# Patient Record
Sex: Female | Born: 1965
Health system: Southern US, Community
[De-identification: ages and names within clinical notes are randomized; demographics above are authoritative.]

## PROBLEM LIST (undated history)

## (undated) DIAGNOSIS — M1990 Unspecified osteoarthritis, unspecified site: Secondary | ICD-10-CM

## (undated) DIAGNOSIS — R519 Headache, unspecified: Secondary | ICD-10-CM

## (undated) DIAGNOSIS — J449 Chronic obstructive pulmonary disease, unspecified: Secondary | ICD-10-CM

## (undated) DIAGNOSIS — J45909 Unspecified asthma, uncomplicated: Secondary | ICD-10-CM

## (undated) DIAGNOSIS — E669 Obesity, unspecified: Secondary | ICD-10-CM

## (undated) DIAGNOSIS — R0600 Dyspnea, unspecified: Secondary | ICD-10-CM

## (undated) DIAGNOSIS — K219 Gastro-esophageal reflux disease without esophagitis: Secondary | ICD-10-CM

## (undated) DIAGNOSIS — T7840XA Allergy, unspecified, initial encounter: Secondary | ICD-10-CM

## (undated) DIAGNOSIS — J40 Bronchitis, not specified as acute or chronic: Secondary | ICD-10-CM

## (undated) DIAGNOSIS — Z973 Presence of spectacles and contact lenses: Secondary | ICD-10-CM

## (undated) DIAGNOSIS — E039 Hypothyroidism, unspecified: Secondary | ICD-10-CM

## (undated) DIAGNOSIS — F329 Major depressive disorder, single episode, unspecified: Secondary | ICD-10-CM

## (undated) DIAGNOSIS — R51 Headache: Secondary | ICD-10-CM

## (undated) DIAGNOSIS — F32A Depression, unspecified: Secondary | ICD-10-CM

## (undated) DIAGNOSIS — Z9889 Other specified postprocedural states: Secondary | ICD-10-CM

## (undated) DIAGNOSIS — R112 Nausea with vomiting, unspecified: Secondary | ICD-10-CM

## (undated) DIAGNOSIS — F419 Anxiety disorder, unspecified: Secondary | ICD-10-CM

## (undated) DIAGNOSIS — M797 Fibromyalgia: Secondary | ICD-10-CM

## (undated) DIAGNOSIS — K5900 Constipation, unspecified: Secondary | ICD-10-CM

## (undated) DIAGNOSIS — G4730 Sleep apnea, unspecified: Secondary | ICD-10-CM

## (undated) HISTORY — PX: CYST EXCISION: SHX5701

## (undated) HISTORY — PX: FOOT SURGERY: SHX648

## (undated) HISTORY — PX: UPPER GASTROINTESTINAL ENDOSCOPY: SHX188

## (undated) HISTORY — PX: KNEE ARTHROSCOPY: SUR90

## (undated) HISTORY — PX: ADENOIDECTOMY: SUR15

## (undated) HISTORY — PX: BACK SURGERY: SHX140

## (undated) HISTORY — PX: COLONOSCOPY: SHX174

## (undated) HISTORY — PX: HAND SURGERY: SHX662

## (undated) HISTORY — DX: Hypothyroidism, unspecified: E03.9

## (undated) HISTORY — PX: ABDOMINAL HYSTERECTOMY: SHX81

## (undated) HISTORY — DX: Allergy, unspecified, initial encounter: T78.40XA

## (undated) HISTORY — PX: TONSILLECTOMY: SUR1361

---

## 2015-10-22 ENCOUNTER — Other Ambulatory Visit: Payer: Self-pay | Admitting: Otolaryngology

## 2015-10-22 DIAGNOSIS — Z8639 Personal history of other endocrine, nutritional and metabolic disease: Secondary | ICD-10-CM

## 2015-10-28 ENCOUNTER — Ambulatory Visit
Admission: RE | Admit: 2015-10-28 | Discharge: 2015-10-28 | Disposition: A | Discharge status: Home / Self Care | Payer: Self-pay | Source: Ambulatory Visit | Attending: Otolaryngology | Admitting: Otolaryngology

## 2015-10-28 DIAGNOSIS — Z8639 Personal history of other endocrine, nutritional and metabolic disease: Secondary | ICD-10-CM

## 2015-12-20 ENCOUNTER — Ambulatory Visit (INDEPENDENT_AMBULATORY_CARE_PROVIDER_SITE_OTHER): Payer: BLUE CROSS/BLUE SHIELD | Admitting: Endocrinology

## 2015-12-20 ENCOUNTER — Encounter: Payer: Self-pay | Admitting: Endocrinology

## 2015-12-20 VITALS — BP 106/70 | HR 74 | Temp 98.3°F | Ht 62.25 in | Wt 170.0 lb

## 2015-12-20 DIAGNOSIS — M542 Cervicalgia: Secondary | ICD-10-CM | POA: Diagnosis not present

## 2015-12-20 DIAGNOSIS — E039 Hypothyroidism, unspecified: Secondary | ICD-10-CM

## 2015-12-20 LAB — BASIC METABOLIC PANEL
BUN: 16 mg/dL (ref 6–23)
CHLORIDE: 104 meq/L (ref 96–112)
CO2: 29 mEq/L (ref 19–32)
Calcium: 9.5 mg/dL (ref 8.4–10.5)
Creatinine, Ser: 0.66 mg/dL (ref 0.40–1.20)
GFR: 99.9 mL/min (ref >60.00)
GLUCOSE: 85 mg/dL (ref 70–99)
POTASSIUM: 4.3 meq/L (ref 3.5–5.1)
SODIUM: 141 meq/L (ref 135–145)

## 2015-12-20 LAB — TSH: TSH: 1.5 u[IU]/mL (ref 0.35–4.50)

## 2015-12-20 NOTE — Patient Instructions (Signed)
blood tests are requested for you today.  We'll let you know about the results.  Let's check a CT scan. you will receive a phone call, about a day and time for an appointment.  Please return in 1 year, or sooner if necessary.

## 2015-12-20 NOTE — Progress Notes (Signed)
   Subjective:    Patient ID: Laura Burnett, female    DOB: 06/13/1966, 50 y.o.   MRN: [id number]  HPI Pt states 8 weeks of slight swelling of the neck, and slight intermittent pain.  she says she had a thyroid bx in approx 1992 (while living in [place]), and that was benign.  she was also dx'ed with hypothyroidism then and started on synthroid.  She has been on her current 75 mcg/d since 2016.  she has no h/o XRT or surgery to the neck.     Review of Systems Denies visual loss, chest pain, dysphagia, skin rash, easy bruising, headache, numbness, and rhinorrhea.  She has slight hoarseness, doe, dry cough, depression, cold intolerance, and fatigue.      Objective:   Physical Exam VS: see vs page GEN: no distress HEAD: head: no deformity eyes: no periorbital swelling, no proptosis external nose and ears are normal mouth: no lesion seen NECK: thyroid is slightly enlarged, with a firm and irreg surface.  CHEST WALL: no deformity LUNGS: clear to auscultation CV: reg rate and rhythm, no murmur ABD: abdomen is soft, nontender.  no hepatosplenomegaly.  not distended.  no hernia MUSCULOSKELETAL: muscle bulk and strength are grossly normal.  no obvious joint swelling.  gait is normal and steady EXTEMITIES: no deformity.  no edema. PULSES: no carotid bruit.  NEURO:  cn 2-12 grossly intact.   readily moves all 4's.  sensation is intact to touch on all 4's.  No tremor.  SKIN:  Normal texture and temperature.  No rash or suspicious lesion is visible.  Not diaphoretic NODES:  None palpable at the neck PSYCH: alert, well-oriented.  Does not appear anxious nor depressed.    [place]: small goiter without nodule Lab Results  Component Value Date   TSH 1.50 12/20/2015      Assessment & Plan:  Small goiter, prob due to chronic thyroiditis. Chronic hypothyroidism: well-replaced Neck pain, new, uncertain etiology.   Patient is advised the following: Patient Instructions  blood tests are requested for  you today.  We'll let you know about the results.  Let's check a CT scan. you will receive a phone call, about a day and time for an appointment.  Please return in 1 year, or sooner if necessary.    addendum: Please continue the same synthroid.

## 2015-12-24 ENCOUNTER — Telehealth: Payer: Self-pay | Admitting: Endocrinology

## 2015-12-24 NOTE — Telephone Encounter (Signed)
Rec'd from [facility] forward 4 pages to [place]

## 2015-12-25 ENCOUNTER — Ambulatory Visit (INDEPENDENT_AMBULATORY_CARE_PROVIDER_SITE_OTHER)
Admission: RE | Admit: 2015-12-25 | Discharge: 2015-12-25 | Disposition: A | Discharge status: Home / Self Care | Payer: BLUE CROSS/BLUE SHIELD | Source: Ambulatory Visit | Attending: Endocrinology | Admitting: Endocrinology

## 2015-12-25 DIAGNOSIS — M542 Cervicalgia: Secondary | ICD-10-CM

## 2015-12-25 MED ORDER — IOPAMIDOL (ISOVUE-300) INJECTION 61%
75.0000 mL | Freq: Once | INTRAVENOUS | Status: AC | PRN
  Administered 2015-12-25: 75 mL via INTRAVENOUS

## 2016-07-16 ENCOUNTER — Other Ambulatory Visit: Payer: Self-pay | Admitting: Nurse Practitioner

## 2016-07-16 DIAGNOSIS — M5106 Intervertebral disc disorders with myelopathy, lumbar region: Secondary | ICD-10-CM

## 2016-07-26 ENCOUNTER — Ambulatory Visit
Admission: RE | Admit: 2016-07-26 | Discharge: 2016-07-26 | Disposition: A | Discharge status: Home / Self Care | Payer: BLUE CROSS/BLUE SHIELD | Source: Ambulatory Visit | Attending: Nurse Practitioner | Admitting: Nurse Practitioner

## 2016-07-26 DIAGNOSIS — M5106 Intervertebral disc disorders with myelopathy, lumbar region: Secondary | ICD-10-CM

## 2016-08-10 ENCOUNTER — Other Ambulatory Visit: Payer: Self-pay | Admitting: Nurse Practitioner

## 2016-08-10 DIAGNOSIS — M5106 Intervertebral disc disorders with myelopathy, lumbar region: Secondary | ICD-10-CM

## 2016-08-18 ENCOUNTER — Ambulatory Visit
Admission: RE | Admit: 2016-08-18 | Discharge: 2016-08-18 | Disposition: A | Discharge status: Home / Self Care | Payer: BLUE CROSS/BLUE SHIELD | Source: Ambulatory Visit | Attending: Nurse Practitioner | Admitting: Nurse Practitioner

## 2016-08-18 ENCOUNTER — Institutional Professional Consult (permissible substitution): Payer: BLUE CROSS/BLUE SHIELD | Admitting: Internal Medicine

## 2016-08-18 DIAGNOSIS — M5106 Intervertebral disc disorders with myelopathy, lumbar region: Secondary | ICD-10-CM

## 2016-08-18 MED ORDER — IOPAMIDOL (ISOVUE-M 200) INJECTION 41%
1.0000 mL | Freq: Once | INTRAMUSCULAR | Status: AC
  Administered 2016-08-18: 1 mL via EPIDURAL

## 2016-08-18 MED ORDER — METHYLPREDNISOLONE ACETATE 40 MG/ML INJ SUSP (RADIOLOG
120.0000 mg | Freq: Once | INTRAMUSCULAR | Status: AC
  Administered 2016-08-18: 120 mg via EPIDURAL

## 2016-08-18 NOTE — Discharge Instructions (Signed)

## 2016-08-21 ENCOUNTER — Other Ambulatory Visit: Payer: BLUE CROSS/BLUE SHIELD

## 2016-10-22 ENCOUNTER — Other Ambulatory Visit: Payer: Self-pay | Admitting: Family

## 2016-10-22 DIAGNOSIS — Z1231 Encounter for screening mammogram for malignant neoplasm of breast: Secondary | ICD-10-CM

## 2016-10-26 ENCOUNTER — Ambulatory Visit
Admission: RE | Admit: 2016-10-26 | Discharge: 2016-10-26 | Disposition: A | Discharge status: Home / Self Care | Payer: BLUE CROSS/BLUE SHIELD | Source: Ambulatory Visit | Attending: Family | Admitting: Family

## 2016-10-26 DIAGNOSIS — Z1231 Encounter for screening mammogram for malignant neoplasm of breast: Secondary | ICD-10-CM

## 2016-11-13 ENCOUNTER — Other Ambulatory Visit: Payer: Self-pay | Admitting: Neurological Surgery

## 2016-11-26 ENCOUNTER — Other Ambulatory Visit (HOSPITAL_COMMUNITY): Payer: Self-pay | Admitting: Neurological Surgery

## 2016-11-26 DIAGNOSIS — M5416 Radiculopathy, lumbar region: Secondary | ICD-10-CM

## 2016-12-04 ENCOUNTER — Ambulatory Visit (HOSPITAL_COMMUNITY)
Admission: RE | Admit: 2016-12-04 | Discharge: 2016-12-04 | Disposition: A | Discharge status: Home / Self Care | Payer: BLUE CROSS/BLUE SHIELD | Source: Ambulatory Visit | Attending: Neurological Surgery | Admitting: Neurological Surgery

## 2016-12-04 DIAGNOSIS — I700 Atherosclerosis of aorta: Secondary | ICD-10-CM | POA: Insufficient documentation

## 2016-12-04 DIAGNOSIS — M5416 Radiculopathy, lumbar region: Secondary | ICD-10-CM | POA: Diagnosis present

## 2016-12-04 DIAGNOSIS — M4807 Spinal stenosis, lumbosacral region: Secondary | ICD-10-CM | POA: Insufficient documentation

## 2016-12-14 ENCOUNTER — Encounter (HOSPITAL_COMMUNITY): Payer: Self-pay

## 2016-12-14 ENCOUNTER — Encounter (HOSPITAL_COMMUNITY)
Admission: RE | Admit: 2016-12-14 | Discharge: 2016-12-14 | Disposition: A | Discharge status: Home / Self Care | Payer: BLUE CROSS/BLUE SHIELD | Source: Ambulatory Visit | Attending: Neurological Surgery | Admitting: Neurological Surgery

## 2016-12-14 DIAGNOSIS — Z01812 Encounter for preprocedural laboratory examination: Secondary | ICD-10-CM | POA: Insufficient documentation

## 2016-12-14 HISTORY — DX: Unspecified osteoarthritis, unspecified site: M19.90

## 2016-12-14 HISTORY — DX: Gastro-esophageal reflux disease without esophagitis: K21.9

## 2016-12-14 HISTORY — DX: Depression, unspecified: F32.A

## 2016-12-14 HISTORY — DX: Other specified postprocedural states: Z98.890

## 2016-12-14 HISTORY — DX: Nausea with vomiting, unspecified: R11.2

## 2016-12-14 HISTORY — DX: Fibromyalgia: M79.7

## 2016-12-14 HISTORY — DX: Major depressive disorder, single episode, unspecified: F32.9

## 2016-12-14 LAB — BASIC METABOLIC PANEL
ANION GAP: 8 (ref 5–15)
BUN: 10 mg/dL (ref 6–20)
CHLORIDE: 107 mmol/L (ref 101–111)
CO2: 26 mmol/L (ref 22–32)
Calcium: 9.1 mg/dL (ref 8.9–10.3)
Creatinine, Ser: 0.75 mg/dL (ref 0.44–1.00)
GFR calc Af Amer: >60 mL/min (ref >60)
GLUCOSE: 83 mg/dL (ref 65–99)
POTASSIUM: 4 mmol/L (ref 3.5–5.1)
Sodium: 141 mmol/L (ref 135–145)

## 2016-12-14 LAB — SURGICAL PCR SCREEN
MRSA, PCR: NEGATIVE
Staphylococcus aureus: NEGATIVE

## 2016-12-14 LAB — TYPE AND SCREEN
ABO/RH(D): O NEG
Antibody Screen: NEGATIVE

## 2016-12-14 LAB — CBC
HCT: 41.6 % (ref 36.0–46.0)
HEMOGLOBIN: 13.6 g/dL (ref 12.0–15.0)
MCH: 29.6 pg (ref 26.0–34.0)
MCHC: 32.7 g/dL (ref 30.0–36.0)
MCV: 90.6 fL (ref 78.0–100.0)
PLATELETS: 329 10*3/uL (ref 150–400)
RBC: 4.59 MIL/uL (ref 3.87–5.11)
RDW: 13.6 % (ref 11.5–15.5)
WBC: 5.8 10*3/uL (ref 4.0–10.5)

## 2016-12-14 LAB — ABO/RH: ABO/RH(D): O NEG

## 2016-12-14 LAB — APTT: APTT: 33 s (ref 24–36)

## 2016-12-14 LAB — PROTIME-INR
INR: 1.05
Prothrombin Time: 13.7 seconds (ref 11.4–15.2)

## 2016-12-14 MED ORDER — CHLORHEXIDINE GLUCONATE CLOTH 2 % EX PADS: 6.0000 | MEDICATED_PAD | Freq: Once | CUTANEOUS | Status: DC

## 2016-12-14 NOTE — Pre-Procedure Instructions (Signed)
    ILLENE SWEETING  12/14/2016      [facility] [address], [place] [address] Phone: [phone] Fax: [phone]    Your procedure is scheduled on 12/21/16.  Report to [facility] Admitting at 530 A.M.  Call this number if you have problems the morning of surgery:  [phone]   Remember:  Do not eat food or drink liquids after midnight.  Take these medicines the morning of surgery with A SIP OF WATER ---cymbalta,nexium,neurontin,synthroid,oxycodone   Do not wear jewelry, make-up or nail polish.  Do not wear lotions, powders, or perfumes, or deoderant.  Do not shave 48 hours prior to surgery.  Men may shave face and neck.  Do not bring valuables to the hospital.  [facility] is not responsible for any belongings or valuables.  Contacts, dentures or bridgework may not be worn into surgery.  Leave your suitcase in the car.  After surgery it may be brought to your room.  For patients admitted to the hospital, discharge time will be determined by your treatment team.  Patients discharged the day of surgery will not be allowed to drive home.   Name and phone number of your driver:    Special instructions:  Do not take any aspirin,anti-inflammatories,vitamins,or herbal supplements 5-7 days prior to surgery.  Please read over the following fact sheets that you were given. MRSA Information

## 2016-12-20 NOTE — Anesthesia Preprocedure Evaluation (Signed)
Anesthesia Evaluation  Patient identified by MRN, date of birth, ID band Patient awake    Reviewed: Allergy & Precautions, H&P , Patient's Chart, lab work & pertinent test results, reviewed documented beta blocker date and time   Airway Mallampati: II  TM Distance: >3 FB Neck ROM: full    Dental no notable dental hx.    Pulmonary former smoker,    Pulmonary exam normal breath sounds clear to auscultation       Cardiovascular  Rhythm:regular Rate:Normal     Neuro/Psych    GI/Hepatic   Endo/Other    Renal/GU      Musculoskeletal   Abdominal   Peds  Hematology   Anesthesia Other Findings   Reproductive/Obstetrics                             Anesthesia Physical Anesthesia Plan  ASA: II  Anesthesia Plan: General   Post-op Pain Management:    Induction: Intravenous  Airway Management Planned: Oral ETT  Additional Equipment:   Intra-op Plan:   Post-operative Plan: Extubation in OR  Informed Consent: I have reviewed the patients History and Physical, chart, labs and discussed the procedure including the risks, benefits and alternatives for the proposed anesthesia with the patient or authorized representative who has indicated his/her understanding and acceptance.   Dental Advisory Given  Plan Discussed with: CRNA and Surgeon  Anesthesia Plan Comments: (  )        Anesthesia Quick Evaluation

## 2016-12-21 ENCOUNTER — Ambulatory Visit: Payer: BLUE CROSS/BLUE SHIELD | Admitting: Endocrinology

## 2016-12-21 ENCOUNTER — Encounter (HOSPITAL_COMMUNITY)
Admission: RE | Disposition: A | Discharge status: Home / Self Care | Payer: Self-pay | Source: Ambulatory Visit | Attending: Neurological Surgery

## 2016-12-21 ENCOUNTER — Inpatient Hospital Stay (HOSPITAL_COMMUNITY): Payer: BLUE CROSS/BLUE SHIELD | Admitting: Anesthesiology

## 2016-12-21 ENCOUNTER — Encounter (HOSPITAL_COMMUNITY): Payer: Self-pay | Admitting: Certified Registered"

## 2016-12-21 ENCOUNTER — Inpatient Hospital Stay (HOSPITAL_COMMUNITY)
Admission: RE | Admit: 2016-12-21 | Discharge: 2016-12-22 | DRG: 460 | Disposition: A | Discharge status: Home / Self Care | Payer: BLUE CROSS/BLUE SHIELD | Source: Ambulatory Visit | Attending: Neurological Surgery | Admitting: Neurological Surgery

## 2016-12-21 ENCOUNTER — Inpatient Hospital Stay (HOSPITAL_COMMUNITY): Payer: BLUE CROSS/BLUE SHIELD

## 2016-12-21 DIAGNOSIS — Z87891 Personal history of nicotine dependence: Secondary | ICD-10-CM

## 2016-12-21 DIAGNOSIS — Z79899 Other long term (current) drug therapy: Secondary | ICD-10-CM | POA: Diagnosis not present

## 2016-12-21 DIAGNOSIS — M5416 Radiculopathy, lumbar region: Secondary | ICD-10-CM | POA: Diagnosis present

## 2016-12-21 DIAGNOSIS — M797 Fibromyalgia: Secondary | ICD-10-CM | POA: Diagnosis present

## 2016-12-21 DIAGNOSIS — E039 Hypothyroidism, unspecified: Secondary | ICD-10-CM | POA: Diagnosis present

## 2016-12-21 DIAGNOSIS — M4807 Spinal stenosis, lumbosacral region: Secondary | ICD-10-CM | POA: Diagnosis present

## 2016-12-21 DIAGNOSIS — K219 Gastro-esophageal reflux disease without esophagitis: Secondary | ICD-10-CM | POA: Diagnosis present

## 2016-12-21 DIAGNOSIS — M4727 Other spondylosis with radiculopathy, lumbosacral region: Secondary | ICD-10-CM | POA: Diagnosis present

## 2016-12-21 DIAGNOSIS — F329 Major depressive disorder, single episode, unspecified: Secondary | ICD-10-CM | POA: Diagnosis present

## 2016-12-21 DIAGNOSIS — Z419 Encounter for procedure for purposes other than remedying health state, unspecified: Secondary | ICD-10-CM

## 2016-12-21 DIAGNOSIS — M545 Low back pain: Secondary | ICD-10-CM | POA: Diagnosis present

## 2016-12-21 HISTORY — PX: APPLICATION OF ROBOTIC ASSISTANCE FOR SPINAL PROCEDURE: SHX6753

## 2016-12-21 SURGERY — POSTERIOR LUMBAR FUSION 1 LEVEL
Anesthesia: General | Site: Back

## 2016-12-21 MED ORDER — THROMBIN 20000 UNITS EX SOLR
CUTANEOUS | Status: DC | PRN
  Administered 2016-12-21: 20 mL via TOPICAL

## 2016-12-21 MED ORDER — PROPOFOL 10 MG/ML IV BOLUS
INTRAVENOUS | Status: AC
  Filled 2016-12-21: qty 20

## 2016-12-21 MED ORDER — DOCUSATE SODIUM 100 MG PO CAPS
100.0000 mg | ORAL_CAPSULE | Freq: Two times a day (BID) | ORAL | Status: DC
  Administered 2016-12-21 – 2016-12-22 (×3): 100 mg via ORAL
  Filled 2016-12-21 (×3): qty 1

## 2016-12-21 MED ORDER — MORPHINE SULFATE (PF) 4 MG/ML IV SOLN: 1.0000 mg | INTRAVENOUS | Status: DC | PRN

## 2016-12-21 MED ORDER — CEFAZOLIN SODIUM-DEXTROSE 2-4 GM/100ML-% IV SOLN
2.0000 g | Freq: Three times a day (TID) | INTRAVENOUS | Status: AC
  Administered 2016-12-21 (×2): 2 g via INTRAVENOUS
  Filled 2016-12-21 (×2): qty 100

## 2016-12-21 MED ORDER — LIDOCAINE-EPINEPHRINE 2 %-1:100000 IJ SOLN
INTRAMUSCULAR | Status: DC | PRN
  Administered 2016-12-21: 30 mL

## 2016-12-21 MED ORDER — SODIUM CHLORIDE 0.9% FLUSH
3.0000 mL | Freq: Two times a day (BID) | INTRAVENOUS | Status: DC
  Administered 2016-12-21: 3 mL via INTRAVENOUS

## 2016-12-21 MED ORDER — ZOLPIDEM TARTRATE 5 MG PO TABS: 5.0000 mg | ORAL_TABLET | Freq: Every evening | ORAL | Status: DC | PRN

## 2016-12-21 MED ORDER — BISACODYL 10 MG RE SUPP: 10.0000 mg | Freq: Every day | RECTAL | Status: DC | PRN

## 2016-12-21 MED ORDER — SODIUM CHLORIDE 0.9 % IV SOLN: 250.0000 mL | INTRAVENOUS | Status: DC

## 2016-12-21 MED ORDER — ONDANSETRON HCL 4 MG/2ML IJ SOLN: 4.0000 mg | Freq: Four times a day (QID) | INTRAMUSCULAR | Status: DC | PRN

## 2016-12-21 MED ORDER — MIDAZOLAM HCL 2 MG/2ML IJ SOLN
INTRAMUSCULAR | Status: AC
  Filled 2016-12-21: qty 2

## 2016-12-21 MED ORDER — THROMBIN 5000 UNITS EX SOLR
OROMUCOSAL | Status: DC | PRN
  Administered 2016-12-21 (×2): 5 mL via TOPICAL

## 2016-12-21 MED ORDER — FENTANYL CITRATE (PF) 100 MCG/2ML IJ SOLN
INTRAMUSCULAR | Status: DC | PRN
  Administered 2016-12-21 (×3): 50 ug via INTRAVENOUS

## 2016-12-21 MED ORDER — METHYLPREDNISOLONE ACETATE 80 MG/ML IJ SUSP
INTRAMUSCULAR | Status: DC | PRN
  Administered 2016-12-21: 80 mg

## 2016-12-21 MED ORDER — MIDAZOLAM HCL 5 MG/5ML IJ SOLN
INTRAMUSCULAR | Status: DC | PRN
Start: 2016-12-21 — End: 2016-12-21
  Administered 2016-12-21: 2 mg via INTRAVENOUS

## 2016-12-21 MED ORDER — VANCOMYCIN HCL 1000 MG IV SOLR
INTRAVENOUS | Status: DC | PRN
  Administered 2016-12-21: 1000 mg via TOPICAL

## 2016-12-21 MED ORDER — HYDROCODONE-ACETAMINOPHEN 7.5-325 MG PO TABS
1.0000 | ORAL_TABLET | Freq: Four times a day (QID) | ORAL | Status: DC
  Administered 2016-12-21 – 2016-12-22 (×4): 1 via ORAL
  Filled 2016-12-21 (×4): qty 1

## 2016-12-21 MED ORDER — SENNOSIDES-DOCUSATE SODIUM 8.6-50 MG PO TABS: 1.0000 | ORAL_TABLET | Freq: Every evening | ORAL | Status: DC | PRN

## 2016-12-21 MED ORDER — SCOPOLAMINE 1 MG/3DAYS TD PT72
MEDICATED_PATCH | TRANSDERMAL | Status: DC | PRN
  Administered 2016-12-21: 1 via TRANSDERMAL

## 2016-12-21 MED ORDER — BUPIVACAINE LIPOSOME 1.3 % IJ SUSP
INTRAMUSCULAR | Status: DC | PRN
  Administered 2016-12-21: 20 mL

## 2016-12-21 MED ORDER — SENNA 8.6 MG PO TABS
1.0000 | ORAL_TABLET | Freq: Two times a day (BID) | ORAL | Status: DC
  Administered 2016-12-21 – 2016-12-22 (×3): 8.6 mg via ORAL
  Filled 2016-12-21 (×3): qty 1

## 2016-12-21 MED ORDER — SODIUM CHLORIDE 0.9 % IR SOLN
Status: DC | PRN
  Administered 2016-12-21: 500 mL

## 2016-12-21 MED ORDER — HYDROMORPHONE HCL 1 MG/ML IJ SOLN
0.2500 mg | INTRAMUSCULAR | Status: DC | PRN
  Administered 2016-12-21 (×2): 0.25 mg via INTRAVENOUS

## 2016-12-21 MED ORDER — ACETAMINOPHEN 10 MG/ML IV SOLN
1000.0000 mg | Freq: Once | INTRAVENOUS | Status: AC
  Administered 2016-12-21: 1000 mg via INTRAVENOUS
  Filled 2016-12-21: qty 100

## 2016-12-21 MED ORDER — ACETAMINOPHEN 325 MG PO TABS
650.0000 mg | ORAL_TABLET | ORAL | Status: DC | PRN
  Administered 2016-12-22: 650 mg via ORAL
  Filled 2016-12-21: qty 2

## 2016-12-21 MED ORDER — SODIUM CHLORIDE 0.9 % IV SOLN: INTRAVENOUS | Status: DC

## 2016-12-21 MED ORDER — THROMBIN 20000 UNITS EX SOLR
CUTANEOUS | Status: AC
  Filled 2016-12-21: qty 20000

## 2016-12-21 MED ORDER — METHOCARBAMOL 1000 MG/10ML IJ SOLN
750.0000 mg | Freq: Once | INTRAMUSCULAR | Status: AC
  Administered 2016-12-21: .75 g via INTRAVENOUS
  Filled 2016-12-21: qty 7.5

## 2016-12-21 MED ORDER — SODIUM CHLORIDE 0.9% FLUSH: 3.0000 mL | INTRAVENOUS | Status: DC | PRN

## 2016-12-21 MED ORDER — LIDOCAINE HCL (CARDIAC) 20 MG/ML IV SOLN
INTRAVENOUS | Status: DC | PRN
  Administered 2016-12-21: 60 mg via INTRAVENOUS

## 2016-12-21 MED ORDER — DEXTROSE 5 % IV SOLN
INTRAVENOUS | Status: DC | PRN
  Administered 2016-12-21: 15 ug/min via INTRAVENOUS

## 2016-12-21 MED ORDER — MENTHOL 3 MG MT LOZG
1.0000 | LOZENGE | OROMUCOSAL | Status: DC | PRN
  Filled 2016-12-21: qty 9

## 2016-12-21 MED ORDER — THROMBIN 5000 UNITS EX SOLR
CUTANEOUS | Status: AC
  Filled 2016-12-21: qty 5000

## 2016-12-21 MED ORDER — KETOROLAC TROMETHAMINE 30 MG/ML IJ SOLN
INTRAMUSCULAR | Status: DC | PRN
  Administered 2016-12-21: 30 mg via INTRAVENOUS

## 2016-12-21 MED ORDER — ONDANSETRON HCL 4 MG/2ML IJ SOLN
INTRAMUSCULAR | Status: AC
  Filled 2016-12-21: qty 2

## 2016-12-21 MED ORDER — LIDOCAINE-EPINEPHRINE 2 %-1:100000 IJ SOLN
INTRAMUSCULAR | Status: AC
  Filled 2016-12-21: qty 1

## 2016-12-21 MED ORDER — CEFAZOLIN SODIUM-DEXTROSE 2-4 GM/100ML-% IV SOLN
2.0000 g | INTRAVENOUS | Status: AC
  Administered 2016-12-21: 2 g via INTRAVENOUS
  Filled 2016-12-21: qty 100

## 2016-12-21 MED ORDER — DEXAMETHASONE SODIUM PHOSPHATE 10 MG/ML IJ SOLN
INTRAMUSCULAR | Status: DC | PRN
  Administered 2016-12-21: 10 mg via INTRAVENOUS

## 2016-12-21 MED ORDER — OXYCODONE HCL 5 MG PO TABS
5.0000 mg | ORAL_TABLET | ORAL | Status: DC | PRN
  Administered 2016-12-21 – 2016-12-22 (×5): 10 mg via ORAL
  Administered 2016-12-22: 5 mg via ORAL
  Filled 2016-12-21 (×2): qty 2
  Filled 2016-12-21: qty 1
  Filled 2016-12-21 (×3): qty 2

## 2016-12-21 MED ORDER — BUPIVACAINE HCL (PF) 0.25 % IJ SOLN
INTRAMUSCULAR | Status: DC | PRN
  Administered 2016-12-21: 30 mL

## 2016-12-21 MED ORDER — METHYLPREDNISOLONE ACETATE 80 MG/ML IJ SUSP
INTRAMUSCULAR | Status: AC
  Filled 2016-12-21: qty 1

## 2016-12-21 MED ORDER — BUPIVACAINE HCL (PF) 0.25 % IJ SOLN
INTRAMUSCULAR | Status: AC
  Filled 2016-12-21: qty 30

## 2016-12-21 MED ORDER — GABAPENTIN 300 MG PO CAPS
300.0000 mg | ORAL_CAPSULE | ORAL | Status: DC
  Filled 2016-12-21: qty 1

## 2016-12-21 MED ORDER — GABAPENTIN 300 MG PO CAPS
300.0000 mg | ORAL_CAPSULE | Freq: Three times a day (TID) | ORAL | Status: DC
  Administered 2016-12-21 – 2016-12-22 (×3): 300 mg via ORAL
  Filled 2016-12-21 (×3): qty 1

## 2016-12-21 MED ORDER — VANCOMYCIN HCL 1000 MG IV SOLR
INTRAVENOUS | Status: AC
  Filled 2016-12-21: qty 1000

## 2016-12-21 MED ORDER — LACTATED RINGERS IV SOLN
INTRAVENOUS | Status: DC | PRN
  Administered 2016-12-21 (×3): via INTRAVENOUS

## 2016-12-21 MED ORDER — BUPIVACAINE LIPOSOME 1.3 % IJ SUSP
20.0000 mL | Freq: Once | INTRAMUSCULAR | Status: DC
  Filled 2016-12-21: qty 20

## 2016-12-21 MED ORDER — SUGAMMADEX SODIUM 200 MG/2ML IV SOLN
INTRAVENOUS | Status: AC
  Filled 2016-12-21: qty 2

## 2016-12-21 MED ORDER — SUGAMMADEX SODIUM 200 MG/2ML IV SOLN
INTRAVENOUS | Status: DC | PRN
  Administered 2016-12-21: 200 mg via INTRAVENOUS

## 2016-12-21 MED ORDER — SCOPOLAMINE 1 MG/3DAYS TD PT72
MEDICATED_PATCH | TRANSDERMAL | Status: AC
  Filled 2016-12-21: qty 1

## 2016-12-21 MED ORDER — FLEET ENEMA 7-19 GM/118ML RE ENEM: 1.0000 | ENEMA | Freq: Once | RECTAL | Status: DC | PRN

## 2016-12-21 MED ORDER — ROCURONIUM BROMIDE 100 MG/10ML IV SOLN
INTRAVENOUS | Status: DC | PRN
  Administered 2016-12-21 (×2): 10 mg via INTRAVENOUS
  Administered 2016-12-21: 40 mg via INTRAVENOUS
  Administered 2016-12-21: 10 mg via INTRAVENOUS

## 2016-12-21 MED ORDER — ONDANSETRON HCL 4 MG/2ML IJ SOLN
INTRAMUSCULAR | Status: DC | PRN
  Administered 2016-12-21: 4 mg via INTRAVENOUS

## 2016-12-21 MED ORDER — CYCLOBENZAPRINE HCL 10 MG PO TABS
10.0000 mg | ORAL_TABLET | Freq: Three times a day (TID) | ORAL | Status: DC | PRN
  Administered 2016-12-21 – 2016-12-22 (×2): 10 mg via ORAL
  Filled 2016-12-21 (×2): qty 1

## 2016-12-21 MED ORDER — PHENYLEPHRINE HCL 10 MG/ML IJ SOLN
INTRAMUSCULAR | Status: DC | PRN
  Administered 2016-12-21 (×3): 80 ug via INTRAVENOUS

## 2016-12-21 MED ORDER — HYDROMORPHONE HCL 1 MG/ML IJ SOLN
INTRAMUSCULAR | Status: AC
  Filled 2016-12-21: qty 0.5

## 2016-12-21 MED ORDER — ACETAMINOPHEN 650 MG RE SUPP: 650.0000 mg | RECTAL | Status: DC | PRN

## 2016-12-21 MED ORDER — FENTANYL CITRATE (PF) 250 MCG/5ML IJ SOLN
INTRAMUSCULAR | Status: AC
  Filled 2016-12-21: qty 5

## 2016-12-21 MED ORDER — ONDANSETRON HCL 4 MG PO TABS: 4.0000 mg | ORAL_TABLET | Freq: Four times a day (QID) | ORAL | Status: DC | PRN

## 2016-12-21 MED ORDER — 0.9 % SODIUM CHLORIDE (POUR BTL) OPTIME
TOPICAL | Status: DC | PRN
  Administered 2016-12-21: 1000 mL

## 2016-12-21 MED ORDER — PHENOL 1.4 % MT LIQD: 1.0000 | OROMUCOSAL | Status: DC | PRN

## 2016-12-21 MED ORDER — PROPOFOL 10 MG/ML IV BOLUS
INTRAVENOUS | Status: DC | PRN
  Administered 2016-12-21: 130 mg via INTRAVENOUS

## 2016-12-21 SURGICAL SUPPLY — 91 items
BAG DECANTER FOR FLEXI CONT (MISCELLANEOUS) ×4 IMPLANT
BENZOIN TINCTURE PRP APPL 2/3 (GAUZE/BANDAGES/DRESSINGS) IMPLANT
BIT DRILL LONG 3.0X30 (BIT) IMPLANT
BIT DRILL LONG 3X80 (BIT) IMPLANT
BIT DRILL LONG 4X80 (BIT) IMPLANT
BIT DRILL SHORT 3.0X30 (BIT) IMPLANT
BIT DRILL SHORT 3X80 (BIT) IMPLANT
BLADE CLIPPER SURG (BLADE) IMPLANT
BLADE SURG 11 STRL SS (BLADE) ×4 IMPLANT
BUR MATCHSTICK NEURO 3.0 LAGG (BURR) ×4 IMPLANT
BUR ROUND FLUTED 5 RND (BURR) ×3 IMPLANT
BUR ROUND FLUTED 5MM RND (BURR) ×1
CANISTER SUCT 3000ML PPV (MISCELLANEOUS) ×4 IMPLANT
CARTRIDGE OIL MAESTRO DRILL (MISCELLANEOUS) ×2 IMPLANT
CHLORAPREP W/TINT 26ML (MISCELLANEOUS) ×4 IMPLANT
DECANTER SPIKE VIAL GLASS SM (MISCELLANEOUS) IMPLANT
DERMABOND ADVANCED (GAUZE/BANDAGES/DRESSINGS) ×2
DERMABOND ADVANCED .7 DNX12 (GAUZE/BANDAGES/DRESSINGS) ×2 IMPLANT
DIFFUSER DRILL AIR PNEUMATIC (MISCELLANEOUS) ×4 IMPLANT
DRAPE C-ARM 42X72 X-RAY (DRAPES) ×8 IMPLANT
DRAPE MICROSCOPE LEICA (MISCELLANEOUS) IMPLANT
DRAPE POUCH INSTRU U-SHP 10X18 (DRAPES) ×4 IMPLANT
DRAPE SHEET LG 3/4 BI-LAMINATE (DRAPES) IMPLANT
DRAPE SURG 17X23 STRL (DRAPES) ×4 IMPLANT
DRSG OPSITE POSTOP 4X6 (GAUZE/BANDAGES/DRESSINGS) ×4 IMPLANT
ELECT BLADE 4.0 EZ CLEAN MEGAD (MISCELLANEOUS)
ELECT REM PT RETURN 9FT ADLT (ELECTROSURGICAL) ×4
ELECTRODE BLDE 4.0 EZ CLN MEGD (MISCELLANEOUS) IMPLANT
ELECTRODE REM PT RTRN 9FT ADLT (ELECTROSURGICAL) ×2 IMPLANT
GAUZE SPONGE 4X4 12PLY STRL (GAUZE/BANDAGES/DRESSINGS) IMPLANT
GAUZE SPONGE 4X4 16PLY XRAY LF (GAUZE/BANDAGES/DRESSINGS) IMPLANT
GLOVE BIO SURGEON STRL SZ7 (GLOVE) IMPLANT
GLOVE BIO SURGEON STRL SZ7.5 (GLOVE) ×4 IMPLANT
GLOVE BIO SURGEON STRL SZ8 (GLOVE) ×4 IMPLANT
GLOVE BIO SURGEON STRL SZ8.5 (GLOVE) ×4 IMPLANT
GLOVE BIOGEL PI IND STRL 6.5 (GLOVE) ×2 IMPLANT
GLOVE BIOGEL PI IND STRL 7.0 (GLOVE) ×4 IMPLANT
GLOVE BIOGEL PI IND STRL 7.5 (GLOVE) ×4 IMPLANT
GLOVE BIOGEL PI INDICATOR 6.5 (GLOVE) ×2
GLOVE BIOGEL PI INDICATOR 7.0 (GLOVE) ×4
GLOVE BIOGEL PI INDICATOR 7.5 (GLOVE) ×4
GLOVE SS BIOGEL STRL SZ 7.5 (GLOVE) ×6 IMPLANT
GLOVE SUPERSENSE BIOGEL SZ 7.5 (GLOVE) ×6
GLOVE SURG SS PI 6.5 STRL IVOR (GLOVE) ×12 IMPLANT
GOWN STRL REUS W/ TWL LRG LVL3 (GOWN DISPOSABLE) ×6 IMPLANT
GOWN STRL REUS W/ TWL XL LVL3 (GOWN DISPOSABLE) ×2 IMPLANT
GOWN STRL REUS W/TWL LRG LVL3 (GOWN DISPOSABLE) ×6
GOWN STRL REUS W/TWL XL LVL3 (GOWN DISPOSABLE) ×2
HEMOSTAT POWDER KIT SURGIFOAM (HEMOSTASIS) ×4 IMPLANT
KIT BASIN OR (CUSTOM PROCEDURE TRAY) ×4 IMPLANT
KIT INFUSE XX SMALL 0.7CC (Orthopedic Implant) ×4 IMPLANT
KIT ROOM TURNOVER OR (KITS) ×4 IMPLANT
KIT SPINE MAZOR X ROBO DISP (MISCELLANEOUS) ×3 IMPLANT
NEEDLE HYPO 18GX1.5 BLUNT FILL (NEEDLE) IMPLANT
NEEDLE HYPO 21X1.5 SAFETY (NEEDLE) ×8 IMPLANT
NEEDLE HYPO 25X1 1.5 SAFETY (NEEDLE) ×4 IMPLANT
NS IRRIG 1000ML POUR BTL (IV SOLUTION) ×4 IMPLANT
OIL CARTRIDGE MAESTRO DRILL (MISCELLANEOUS) ×4
PACK LAMINECTOMY NEURO (CUSTOM PROCEDURE TRAY) ×4 IMPLANT
PACK UNIVERSAL I (CUSTOM PROCEDURE TRAY) ×4 IMPLANT
PAD ARMBOARD 7.5X6 YLW CONV (MISCELLANEOUS) ×4 IMPLANT
PATTIES SURGICAL .5X1.5 (GAUZE/BANDAGES/DRESSINGS) IMPLANT
PIN HEAD 2.5X60MM (PIN) IMPLANT
PUTTY DBM GRAFTON 5CC (Putty) ×3 IMPLANT
ROD PC 5.5X35 TI ARSENAL (Rod) ×6 IMPLANT
RUBBERBAND STERILE (MISCELLANEOUS) IMPLANT
SCREW CBX 5.5X35MM ARSENAL (Screw) ×8 IMPLANT
SCREW CBX ARSENAL 6.5X35 (Screw) ×6 IMPLANT
SCREW SCHANZ SA 4.0MM (MISCELLANEOUS) IMPLANT
SCREW SET SPINAL ARSENAL 47127 (Screw) ×16 IMPLANT
SPACER PEEK PS 25X8MM 9MM 5DEG (Spacer) ×6 IMPLANT
SPONGE NEURO XRAY DETECT 1X3 (DISPOSABLE) ×4 IMPLANT
SPONGE SURGIFOAM ABS GEL 100 (HEMOSTASIS) ×4 IMPLANT
STRIP SURGICAL 1 X 6 IN (GAUZE/BANDAGES/DRESSINGS) IMPLANT
STRIP SURGICAL 1/2 X 6 IN (GAUZE/BANDAGES/DRESSINGS) IMPLANT
STRIP SURGICAL 1/4 X 6 IN (GAUZE/BANDAGES/DRESSINGS) IMPLANT
STRIP SURGICAL 3/4 X 6 IN (GAUZE/BANDAGES/DRESSINGS) IMPLANT
SUT STRATAFIX MNCRL+ 3-0 PS-2 (SUTURE) ×2
SUT STRATAFIX MONOCRYL 3-0 (SUTURE) ×2
SUT VIC AB 0 CT1 18XCR BRD8 (SUTURE) ×4 IMPLANT
SUT VIC AB 0 CT1 8-18 (SUTURE) ×4
SUT VIC AB 2-0 CT1 18 (SUTURE) ×8 IMPLANT
SUT VIC AB 3-0 SH 8-18 (SUTURE) ×8 IMPLANT
SUT VIC AB 4-0 PS2 27 (SUTURE) ×4 IMPLANT
SUTURE STRATFX MNCRL+ 3-0 PS-2 (SUTURE) ×2 IMPLANT
SYR 30ML LL (SYRINGE) ×8 IMPLANT
SYR 5ML LL (SYRINGE) IMPLANT
TIP BLUNT NITINOL ILLICO 18 (INSTRUMENTS) ×4 IMPLANT
TOWEL GREEN STERILE (TOWEL DISPOSABLE) ×4 IMPLANT
TOWEL GREEN STERILE FF (TOWEL DISPOSABLE) ×3 IMPLANT
WATER STERILE IRR 1000ML POUR (IV SOLUTION) ×4 IMPLANT

## 2016-12-21 NOTE — Transfer of Care (Signed)
Immediate Anesthesia Transfer of Care Note  Patient: Laura Burnett  Procedure(s) Performed: Procedure(s) with comments: Lumbar Five-Sacral One Bilateral facetectomies/posterior lumbar interbody fusion/cortical trajectory screw fixation/mazor robot (Bilateral) - L5-S1 Bilateral facetectomies/posterior lumbar interbody fusion/cortical trajectory screw fixation/mazor robot APPLICATION OF ROBOTIC ASSISTANCE FOR SPINAL PROCEDURE (N/A)  Patient Location: PACU  Anesthesia Type:General  Level of Consciousness: awake and sedated  Airway & Oxygen Therapy: Patient Spontanous Breathing and Patient connected to nasal cannula oxygen  Post-op Assessment: Report given to RN, Post -op Vital signs reviewed and stable and Patient moving all extremities X 4  Post vital signs: Reviewed and stable  Last Vitals:  Vitals:   12/21/16 0644  BP: 106/80  Pulse: 92  Resp: 20  Temp: 36.8 C    Last Pain:  Vitals:   12/21/16 0644  TempSrc: Oral  PainSc: 7       Patients Stated Pain Goal: 3 (07/02/10 1735)  Complications: No apparent anesthesia complications

## 2016-12-21 NOTE — Anesthesia Procedure Notes (Signed)
Procedure Name: Intubation Date/Time: 12/21/2016 7:51 AM Performed by: Gaylene Brooks Pre-anesthesia Checklist: Patient identified, Emergency Drugs available, Suction available and Patient being monitored Patient Re-evaluated:Patient Re-evaluated prior to inductionOxygen Delivery Method: Circle System Utilized Preoxygenation: Pre-oxygenation with 100% oxygen Intubation Type: IV induction Ventilation: Mask ventilation without difficulty Laryngoscope Size: Miller and 2 Grade View: Grade II Tube type: Oral Tube size: 7.0 mm Number of attempts: 1 Airway Equipment and Method: Stylet and Oral airway Placement Confirmation: ETT inserted through vocal cords under direct vision,  positive ETCO2 and breath sounds checked- equal and bilateral Secured at: 22 cm Tube secured with: Tape Dental Injury: Teeth and Oropharynx as per pre-operative assessment

## 2016-12-21 NOTE — Op Note (Signed)
12/21/2016  10:43 AM  PATIENT:  Laura Burnett  51 y.o. female  PRE-OPERATIVE DIAGNOSIS:  Lumbosacral spondylosis with radiculopathy; neuroforaminal stenosis L5-S1 bilaterally  POST-OPERATIVE DIAGNOSIS:  Same  PROCEDURE:  Posterior lumbar interbody fusion L5-S1 with cortical trajectory pedicle screw fixation; re-operative laminectomy right L5-S1, laminectomy left L5-S1; bilateral facetectomies L5-S1; use of BMP  SURGEON:  Aldean Ast, MD  ASSISTANTS: Newman Pies, MD  ANESTHESIA:   General  DRAINS: None   SPECIMEN:  None  INDICATION FOR PROCEDURE: 51 year old woman with refractory lumbar radiculopathy.  I offered the above operation. Patient understood the risks, benefits, and alternatives and potential outcomes and wished to proceed.  PROCEDURE DETAILS: After smooth induction of general endotracheal anesthesia the patient was turned prone on the [place] table. The skin of the lumbar area was clipped of hair and wiped out with alcohol. It was prepped and draped in the usual sterile fashion. The planned incision was injected with a mixture of lidocaine and Marcaine with epinephrine.  The skin was opened sharply and a subperiosteal dissection was performed to expose the lateral edges of the lamina of L5-S1.  I encountered scar over the right lamina of L5-S1. Subperiosteal dissection was performed over the L5-S1 facet joints bilaterally.    The [facility] robotic system was registered.  Utilizing a drill guide and high speed drill the pedicles were cannulated in a trajectory for cortical screws at L5 and S1 bilaterally.  K-wires were placed. These were checked with fluoroscopy and found to be in good position.  I then tapped over the K-wires and removed them.  A wide decompression including superior facetectomy and partial inferior facetectomy was performed at L5-S1 to decompress the thecal sac and nerve roots. The exiting L5 and traversing S1 nerve roots were identified and found to  to be adequately decompressed at this point.  At L5-S1 on the left an annulotomy was made. Using sequentially larger disc shavers this space was expanded. Disc material was removed with shavers, curettes, and the bone was decorticated with a rasp. The interbody space was packed with a combination BMP, grafton, and locally harvested bone and two PEEK spacers with titanium endplates were inserted into the interbody space.  A lordotic rod was inserted and secured in the screw caps. Final tightening with a torque wrench was performed at all levels. Meticulous hemostasis was obtained. The wound was irrigated with bacitracin saline. A solution of toradol, depomedrol, and plain marcaine was injected in the epidural space.  Exparel was injected into the paraspinous muscles.  About 1 g of vancomycin powder was inserted into the wound. The wound was closed in routine anatomic layers. The skin was closed with a running subcuticular monocryl suture and then sealed with dermabond.   The patient was then returned to the supine position on the bed.  PATIENT DISPOSITION:  PACU - hemodynamically stable.   Delay start of Pharmacological VTE agent (>24hrs) due to surgical blood loss or risk of bleeding:  yes

## 2016-12-21 NOTE — Evaluation (Signed)
Physical Therapy Evaluation Patient Details Name: Laura Burnett MRN: [id number] DOB: 03-Sep-1965 Today's Date: 12/21/2016   History of Present Illness  Patient is a 51 yo female s/p posterior lumbar interbody fusion L5-S1 with cortical trajectory pedicle screw fixation; re-operative laminectomy right L5-S1, laminectomy left L5-S1; bilateral facetectomies L5-S1  Clinical Impression  Patient seen for mobility assessment and education s/p spinal surgery. Patient mobilizing fairly at this time, anticipate that patient will progress well with further mobility. Educated patient on precautions. Will continue to see and progress as tolerated.    Follow Up Recommendations No PT follow up;Supervision - Intermittent    Equipment Recommendations  None recommended by PT    Recommendations for Other Services       Precautions / Restrictions Precautions Precautions: Back Precaution Booklet Issued: Yes (comment) Precaution Comments: verbally reviewed with patient  Required Braces or Orthoses: Spinal Brace Spinal Brace: Lumbar corset;Applied in sitting position Restrictions Weight Bearing Restrictions: No      Mobility  Bed Mobility Overal bed mobility: Modified Independent             General bed mobility comments: patient able to perform without cues to come to sidelying and elevate to sitting, states she has been doing technique since she had a hysterectomy  Transfers Overall transfer level: Needs assistance Equipment used: 1 person hand held assist Transfers: Sit to/from Stand Sit to Stand: Min assist         General transfer comment: min assist for stability, initial posterior lean against bed to gain upright, increased time to perform  Ambulation/Gait Ambulation/Gait assistance: Min assist Ambulation Distance (Feet): 160 Feet Assistive device: 1 person hand held assist Gait Pattern/deviations: Step-through pattern;Decreased stride length;Drifts right/left;Narrow base of  support Gait velocity: decreased Gait velocity interpretation: Below normal speed for age/gender General Gait Details: very guarded with gait, some instability noted with decreased cadence. VCs for upright posture and normalized gait pattern  Stairs            Wheelchair Mobility    Modified Rankin (Stroke Patients Only)       Balance Overall balance assessment: Needs assistance Sitting-balance support: Feet supported Sitting balance-Leahy Scale: Good Sitting balance - Comments: able to sit EOB, don brace and tolerated EOB for extended time     Standing balance-Leahy Scale: Fair Standing balance comment: able to stand self support for brief periods of time without assist                             Pertinent Vitals/Pain Pain Assessment: 0-10 Pain Score: 7  Pain Location: low back pain, surgical site pain Pain Descriptors / Indicators: Operative site guarding Pain Intervention(s): Monitored during session;Patient requesting pain meds-RN notified    Home Living Family/patient expects to be discharged to:: Private residence Living Arrangements: Alone Available Help at Discharge: Family;Available PRN/intermittently Type of Home: House Home Access: Level entry (2 steps on the deck)     Home Layout: One level        Prior Function Level of Independence: Independent               Hand Dominance   Dominant Hand: Right    Extremity/Trunk Assessment   Upper Extremity Assessment Upper Extremity Assessment: Overall WFL for tasks assessed    Lower Extremity Assessment Lower Extremity Assessment: Overall WFL for tasks assessed       Communication   Communication: No difficulties  Cognition Arousal/Alertness: Awake/alert Behavior During Therapy:  WFL for tasks assessed/performed Overall Cognitive Status: Within Functional Limits for tasks assessed                                        General Comments      Exercises      Assessment/Plan    PT Assessment Patient needs continued PT services  PT Problem List Decreased activity tolerance;Decreased balance;Decreased mobility;Decreased knowledge of precautions;Pain       PT Treatment Interventions DME instruction;Gait training;Stair training;Functional mobility training;Therapeutic activities;Therapeutic exercise;Balance training;Patient/family education    PT Goals (Current goals can be found in the Care Plan section)  Acute Rehab PT Goals Patient Stated Goal: to move better PT Goal Formulation: With patient Time For Goal Achievement: 01/04/17 Potential to Achieve Goals: Good    Frequency Min 5X/week   Barriers to discharge        Co-evaluation               End of Session Equipment Utilized During Treatment: Gait belt;Back brace Activity Tolerance: Patient tolerated treatment well Patient left: in bed;with call bell/phone within reach;with family/visitor present Nurse Communication: Mobility status PT Visit Diagnosis: Unsteadiness on feet (R26.81)    Time: 4765-4650 PT Time Calculation (min) (ACUTE ONLY): 22 min   Charges:   PT Evaluation $PT Eval Moderate Complexity: 1 Procedure     PT G Codes:        Alben Deeds, PT DPT  [phone]   Duncan Dull 12/21/2016, 4:25 PM

## 2016-12-21 NOTE — H&P (Signed)
CC: back pain  HPI: Laura Burnett is a 51 y.o. female who presents for long standing history of low back pain for two years.  She complains of pain that radiates into her legs, left greater than right.  She takes gabapentin and flexeril.  She had an epidural steroid injection several months ago which she states was not helpful.  She denies weakness or numbness.  She denies bowel or bladder dysfunction.  PMH: Past Medical History:  Diagnosis Date  . Arthritis   . Depression   . Fibromyalgia   . GERD (gastroesophageal reflux disease)   . Hypothyroidism   . PONV (postoperative nausea and vomiting)     PSH: Past Surgical History:  Procedure Laterality Date  . BACK SURGERY    . FOOT SURGERY    . HAND SURGERY     from [facility]  . KNEE ARTHROSCOPY     x2    SH: Social History  Substance Use Topics  . Smoking status: Former Research scientist (life sciences)  . Smokeless tobacco: Never Used  . Alcohol use Yes     Comment: weekly    MEDS: Prior to Admission medications   Medication Sig Start Date End Date Taking? Authorizing Provider  atorvastatin (LIPITOR) 10 MG tablet Take 10 mg by mouth every evening.    Yes Historical Provider, MD  cyclobenzaprine (FLEXERIL) 10 MG tablet Take 10 mg by mouth 2 (two) times daily.   Yes Historical Provider, MD  DULoxetine (CYMBALTA) 60 MG capsule Take 60 mg by mouth every evening.   Yes Historical Provider, MD  esomeprazole (NEXIUM) 20 MG capsule Take 20 mg by mouth daily before breakfast.   Yes Historical Provider, MD  gabapentin (NEURONTIN) 400 MG capsule Take 400 mg by mouth 4 (four) times daily.   Yes Historical Provider, MD  ibuprofen (ADVIL,MOTRIN) 200 MG tablet Take 200-600 mg by mouth every 8 (eight) hours as needed (for pain.).   Yes Historical Provider, MD  levothyroxine (SYNTHROID, LEVOTHROID) 75 MCG tablet Take 75 mcg by mouth daily before breakfast.   Yes Historical Provider, MD  Oxycodone HCl 10 MG TABS Take 10 mg by mouth daily as needed (for pain.).   Yes  Historical Provider, MD  Glucosamine-Chondroitin (MOVE FREE PO) Take 1 tablet by mouth at bedtime.    Historical Provider, MD  oxyCODONE-acetaminophen (PERCOCET/ROXICET) 5-325 MG tablet Take 1 tablet by mouth 3 (three) times daily as needed (for pain.).    Historical Provider, MD  senna-docusate (SENOKOT-S) 8.6-50 MG tablet Take 2 tablets by mouth at bedtime.    Historical Provider, MD    ALLERGY: Allergies  Allergen Reactions  . Ciprofloxacin Nausea And Vomiting  . Phentermine Other (See Comments)    Tearing, lightheadedness, depression  . Tetanus Toxoids Nausea And Vomiting    ROS: Review of Systems  Constitutional: Negative.   HENT: Negative.   Eyes: Negative.   Respiratory: Negative.   Cardiovascular: Negative.   Gastrointestinal: Negative.   Genitourinary: Negative.   Musculoskeletal: Positive for back pain and myalgias. Negative for falls.  Skin: Negative.   Neurological: Negative for dizziness, tingling, tremors, sensory change, speech change, focal weakness, seizures, loss of consciousness and headaches.    Vitals:   12/21/16 0644  BP: 106/80  Pulse: 92  Resp: 20  Temp: 98.3 F (36.8 C)   General appearance: WDWN, NAD Eyes: PERRL, Fundoscopic: normal Cardiovascular: Regular rate and rhythm without murmurs, rubs, gallops. No edema or variciosities. Distal pulses normal. Pulmonary: Clear to auscultation Musculoskeletal:  Muscle tone upper extremities: Normal    Muscle tone lower extremities: Normal    Motor exam: Upper Extremities Deltoid Bicep Tricep Grip  Right 5/5 5/5 5/5 5/5  Left 5/5 5/5 5/5 5/5   Lower Extremity IP Quad PF DF EHL  Right 5/5 5/5 5/5 5/5 5/5  Left 5/5 5/5 5/5 5/5 5/5   Neurological Awake, alert, oriented Memory and concentration grossly intact Speech fluent, appropriate CNII: Visual fields normal CNIII/IV/VI: EOMI CNV: Facial sensation normal CNVII: Symmetric, normal strength CNVIII: Grossly normal CNIX: Normal palate  movement CNXI: Trap and SCM strength normal CN XII: Tongue protrusion normal Sensation grossly intact to LT DTR: Normal Coordination (finger/nose & heel/shin): Normal  IMAGING:  have independently reviewed her MRI.  She has multilevel spondylosis and disc degeneration at L3-4 and L5-S1.  She has lateral recess and bilateral foraminal stenosis at L5-S1.  There is a laminectomy defect at L5-S1 on the right.   IMPRESSION/PLAN - Laura Burnett has lumbosacral spondylosis with radiculopathy.  She has stenosis at L5-S1 bilaterally.   I have offered L5-S1 facetectomies, posterior lumbar interbody fusion, and cortical screw fixation.  We have discussed the risks, benefits, and alternatives to surgery.  She wishes to proceed.

## 2016-12-21 NOTE — Progress Notes (Signed)
Orthopedic Tech Progress Note Patient Details:  Laura Burnett Jan 11, 1966 [id number]  Patient ID: Laura Burnett, female   DOB: 1966/03/30, 51 y.o.   MRN: [id number]   [name] 12/21/2016, 1:42 PM Called in bio-tech brace order; spoke with Bella Kennedy

## 2016-12-22 LAB — BASIC METABOLIC PANEL
Anion gap: 7 (ref 5–15)
BUN: 10 mg/dL (ref 6–20)
CO2: 27 mmol/L (ref 22–32)
Calcium: 8.9 mg/dL (ref 8.9–10.3)
Chloride: 106 mmol/L (ref 101–111)
Creatinine, Ser: 0.79 mg/dL (ref 0.44–1.00)
GFR calc Af Amer: >60 mL/min (ref >60)
GFR calc non Af Amer: >60 mL/min (ref >60)
Glucose, Bld: 115 mg/dL — ABNORMAL HIGH (ref 65–99)
Potassium: 4.4 mmol/L (ref 3.5–5.1)
Sodium: 140 mmol/L (ref 135–145)

## 2016-12-22 LAB — CBC
HCT: 35.5 % — ABNORMAL LOW (ref 36.0–46.0)
Hemoglobin: 11.3 g/dL — ABNORMAL LOW (ref 12.0–15.0)
MCH: 29.3 pg (ref 26.0–34.0)
MCHC: 31.8 g/dL (ref 30.0–36.0)
MCV: 92 fL (ref 78.0–100.0)
Platelets: 330 10*3/uL (ref 150–400)
RBC: 3.86 MIL/uL — ABNORMAL LOW (ref 3.87–5.11)
RDW: 13.5 % (ref 11.5–15.5)
WBC: 16.3 10*3/uL — ABNORMAL HIGH (ref 4.0–10.5)

## 2016-12-22 LAB — APTT: aPTT: 30 seconds (ref 24–36)

## 2016-12-22 LAB — PROTIME-INR
INR: 0.99
Prothrombin Time: 13.1 seconds (ref 11.4–15.2)

## 2016-12-22 MED ORDER — HYDROCODONE-ACETAMINOPHEN 5-325 MG PO TABS: 1.0000 | ORAL_TABLET | ORAL | 0 refills | Status: DC | PRN

## 2016-12-22 MED ORDER — DIPHENHYDRAMINE HCL 25 MG PO CAPS
25.0000 mg | ORAL_CAPSULE | Freq: Four times a day (QID) | ORAL | Status: DC | PRN
  Administered 2016-12-22: 25 mg via ORAL
  Filled 2016-12-22: qty 1

## 2016-12-22 MED ORDER — CYCLOBENZAPRINE HCL 10 MG PO TABS: 10.0000 mg | ORAL_TABLET | Freq: Three times a day (TID) | ORAL | 1 refills | Status: DC | PRN

## 2016-12-22 MED ORDER — OXYCODONE-ACETAMINOPHEN 5-325 MG PO TABS: 1.0000 | ORAL_TABLET | ORAL | 0 refills | Status: DC | PRN

## 2016-12-22 NOTE — Progress Notes (Signed)
Discharge instructions reviewed with patient. All questions answered at this time. RX given. Pt awaiting for son to transport to disposition.  ,RN

## 2016-12-22 NOTE — Evaluation (Signed)
Occupational Therapy Evaluation Patient Details Name: Laura Burnett MRN: [id number] DOB: 1966/02/18 Today's Date: 12/22/2016    History of Present Illness Patient is a 51 yo female s/p posterior lumbar interbody fusion L5-S1 with cortical trajectory pedicle screw fixation; re-operative laminectomy right L5-S1, laminectomy left L5-S1; bilateral facetectomies L5-S1   Clinical Impression   Patient evaluated by Occupational Therapy with no further acute OT needs identified. All education has been completed and the patient has no further questions. See below for any follow-up Occupational Therapy or equipment needs. OT to sign off. Thank you for referral.      Follow Up Recommendations  No OT follow up    Equipment Recommendations  None recommended by OT    Recommendations for Other Services       Precautions / Restrictions Precautions Precautions: Back Precaution Comments: reviewed adl in details Required Braces or Orthoses: Spinal Brace Spinal Brace: Lumbar corset;Applied in sitting position      Mobility Bed Mobility Overal bed mobility: Modified Independent                Transfers Overall transfer level: Needs assistance   Transfers: Sit to/from Stand Sit to Stand: Supervision              Balance                                           ADL either performed or assessed with clinical judgement   ADL Overall ADL's : Modified independent                                       General ADL Comments: pt able able to cross bil LE and don socks. pt able to don brace. pt educated on adls with back precautions   Pt educated on bathing and avoid washing directly on incision. Pt educated to use new wash cloth and towel each day. Pt educated to allow water to run across dressing and not to soak in a tub at this time. Pt advised RN will instruct on any bandages required otherwise is open to air.   Back handout provided and  reviewed adls in detail. Pt educated on: clothing between brace, never sleep in brace, set an alarm at night for medication, avoid sitting for long periods of time, correct bed positioning for sleeping, correct sequence for bed mobility, avoiding lifting more than 5 pounds and never wash directly over incision. All education is complete and patient indicates understanding.    Vision         Perception     Praxis      Pertinent Vitals/Pain Pain Assessment: 0-10 Pain Score: 7  Pain Location: low back pain, surgical site pain Pain Descriptors / Indicators: Operative site guarding Pain Intervention(s): Monitored during session;Premedicated before session;Repositioned     Hand Dominance Right   Extremity/Trunk Assessment Upper Extremity Assessment Upper Extremity Assessment: Overall WFL for tasks assessed   Lower Extremity Assessment Lower Extremity Assessment: Defer to PT evaluation   Cervical / Trunk Assessment Cervical / Trunk Assessment: Other exceptions (s/p surg)   Communication Communication Communication: No difficulties   Cognition Arousal/Alertness: Awake/alert Behavior During Therapy: WFL for tasks assessed/performed Overall Cognitive Status: Within Functional Limits for tasks assessed  General Comments       Exercises     Shoulder Instructions      Home Living Family/patient expects to be discharged to:: Private residence Living Arrangements: Alone Available Help at Discharge: Family;Available PRN/intermittently Type of Home: House Home Access: Level entry     Home Layout: One level     Bathroom Shower/Tub: Occupational psychologist: Standard     Home Equipment: Shower seat;Grab bars - tub/shower          Prior Functioning/Environment Level of Independence: Independent                 OT Problem List:        OT Treatment/Interventions:      OT Goals(Current goals can be  found in the care plan section) Acute Rehab OT Goals Patient Stated Goal: to d/c home   OT Frequency:     Barriers to D/C:            Co-evaluation              End of Session Equipment Utilized During Treatment: Rolling walker Nurse Communication: Mobility status;Precautions  Activity Tolerance: Patient tolerated treatment well Patient left: in chair;with call bell/phone within reach  OT Visit Diagnosis: Unsteadiness on feet (R26.81)                Time: 2902-1115 OT Time Calculation (min): 18 min Charges:  OT General Charges $OT Visit: 1 Procedure OT Evaluation $OT Eval Moderate Complexity: 1 Procedure G-Codes:      Jeri Modena   OTR/L Pager: [phone] Office: [phone] .   Parke Poisson B 12/22/2016, 9:06 AM

## 2016-12-22 NOTE — Progress Notes (Signed)
Physical Therapy Treatment Patient Details Name: Laura Burnett MRN: [id number] DOB: [birth year] Today's Date: 12/22/2016    History of Present Illness Patient is a 51 yo female s/p posterior lumbar interbody fusion L5-S1 with cortical trajectory pedicle screw fixation; re-operative laminectomy right L5-S1, laminectomy left L5-S1; bilateral facetectomies L5-S1 on 12/21/16.    PT Comments    Pt progressing towards physical therapy goals. She was able to complete stair training this session with min cues for sequencing and hands-on guarding for support. Pt was educated on precautions, walking program, car transfer, and general safety with mobility at home. Will continue to follow and progress as able per POC.    Follow Up Recommendations  No PT follow up;Supervision - Intermittent     Equipment Recommendations  None recommended by PT    Recommendations for Other Services       Precautions / Restrictions Precautions Precautions: Back Precaution Booklet Issued: Yes (comment) Precaution Comments: Reviewed verbally, and pt was cued for precautions during functional mobility.  Required Braces or Orthoses: Spinal Brace Spinal Brace: Lumbar corset;Applied in sitting position Restrictions Weight Bearing Restrictions: No    Mobility  Bed Mobility Overal bed mobility: Modified Independent             General bed mobility comments: Pt sitting up in recliner upon PT arrival.   Transfers Overall transfer level: Needs assistance Equipment used: None Transfers: Sit to/from Stand Sit to Stand: Supervision         General transfer comment: No assist required to power-up to full standing position.   Ambulation/Gait Ambulation/Gait assistance: Min guard;Supervision Ambulation Distance (Feet): 400 Feet Assistive device: None Gait Pattern/deviations: Step-through pattern;Decreased stride length;Drifts right/left;Narrow base of support Gait velocity: decreased Gait velocity  interpretation: Below normal speed for age/gender General Gait Details: Slow but generally steady gait. Close guard provided initially with progression to supervision for safety.    Stairs Stairs: Yes   Stair Management: One rail Left;Step to pattern Number of Stairs: 4 General stair comments: VC's for sequencing and general safety.   Wheelchair Mobility    Modified Rankin (Stroke Patients Only)       Balance Overall balance assessment: Needs assistance Sitting-balance support: Feet supported Sitting balance-Leahy Scale: Good       Standing balance-Leahy Scale: Fair Standing balance comment: able to stand self support without assist                            Cognition Arousal/Alertness: Awake/alert Behavior During Therapy: WFL for tasks assessed/performed Overall Cognitive Status: Within Functional Limits for tasks assessed                                        Exercises      General Comments        Pertinent Vitals/Pain Pain Assessment: Faces Pain Score: 7  Faces Pain Scale: Hurts even more Pain Location: low back pain, surgical site pain Pain Descriptors / Indicators: Operative site guarding Pain Intervention(s): Limited activity within patient's tolerance;Monitored during session;Repositioned    Home Living Family/patient expects to be discharged to:: Private residence Living Arrangements: Alone Available Help at Discharge: Family;Available PRN/intermittently Type of Home: House Home Access: Level entry   Home Layout: One level Home Equipment: Shower seat;Grab bars - tub/shower      Prior Function Level of Independence: Independent  PT Goals (current goals can now be found in the care plan section) Acute Rehab PT Goals Patient Stated Goal: to d/c home  PT Goal Formulation: With patient Time For Goal Achievement: 01/04/17 Potential to Achieve Goals: Good Progress towards PT goals: Progressing toward  goals    Frequency    Min 5X/week      PT Plan Current plan remains appropriate    Co-evaluation             End of Session Equipment Utilized During Treatment: Gait belt;Back brace Activity Tolerance: Patient tolerated treatment well Patient left: in bed;with call bell/phone within reach;with family/visitor present Nurse Communication: Mobility status PT Visit Diagnosis: Unsteadiness on feet (R26.81)     Time: 8343-7357 PT Time Calculation (min) (ACUTE ONLY): 22 min  Charges:  $Gait Training: 8-22 mins                    G Codes:       Rolinda Roan, PT, DPT Acute Rehabilitation Services Pager: [phone]    Thelma Comp 12/22/2016, 9:18 AM

## 2016-12-22 NOTE — Discharge Summary (Signed)
Physician Discharge Summary  Patient ID: Laura Burnett MRN: [id number] DOB/AGE: 1966-03-27 51 y.o.  Admit date: 12/21/2016 Discharge date: 12/22/2016  Admission Diagnoses:  Lumbosacral spondylosis with radiculopathy; neuroforaminal stenosis L5-S1 bilaterally  Discharge Diagnoses:  Same Active Problems:   Lumbar radiculopathy   Discharged Condition: Stable  Hospital Course:  Laura Burnett is a 51 y.o. female was admitted for the below procedure. There were no post operative complications. She was ambulating in the halls. Voiding normal. Tolerating po. Pain well controlled. Discharge in stable condition.  Treatments: Surgery - Posterior lumbar interbody fusion L5-S1 with cortical trajectory pedicle screw fixation; re-operative laminectomy right L5-S1, laminectomy left L5-S1; bilateral facetectomies L5-S1; use of BMP  Discharge Exam: Blood pressure 114/78, pulse 89, temperature 98.4 F (36.9 C), temperature source Oral, resp. rate 18, height 5\' 3"  (1.6 m), weight 82.6 kg (182 lb), SpO2 100 %. Awake, alert, oriented Speech fluent, appropriate CN grossly intact 5/5 BUE/BLE Wound c/d/i  Disposition: Final discharge disposition not confirmed  Discharge Instructions    Call MD for:  difficulty breathing, headache or visual disturbances    Complete by:  As directed    Call MD for:  persistant dizziness or light-headedness    Complete by:  As directed    Call MD for:  redness, tenderness, or signs of infection (pain, swelling, redness, odor or green/yellow discharge around incision site)    Complete by:  As directed    Call MD for:  severe uncontrolled pain    Complete by:  As directed    Call MD for:  temperature >100.4    Complete by:  As directed    Diet general    Complete by:  As directed    Driving Restrictions    Complete by:  As directed    Do not drive until given clearance.   Incentive spirometry RT    Complete by:  As directed    Increase activity slowly     Complete by:  As directed    Lifting restrictions    Complete by:  As directed    Do not lift anything >10lbs. Avoid bending and twisting in awkward positions. Avoid bending at the back.   May shower / Bathe    Complete by:  As directed    Okay to wash wound with warm soapy water. Avoid scrubbing the wound. Pat dry.   Remove dressing in 24 hours    Complete by:  As directed      Allergies as of 12/22/2016      Reactions   Ciprofloxacin Nausea And Vomiting   Phentermine Other (See Comments)   Tearing, lightheadedness, depression   Tetanus Toxoids Nausea And Vomiting      Medication List    STOP taking these medications   Oxycodone HCl 10 MG Tabs   oxyCODONE-acetaminophen 5-325 MG tablet Commonly known as:  PERCOCET/ROXICET     TAKE these medications   atorvastatin 10 MG tablet Commonly known as:  LIPITOR Take 10 mg by mouth every evening.   cyclobenzaprine 10 MG tablet Commonly known as:  FLEXERIL Take 1 tablet (10 mg total) by mouth 3 (three) times daily as needed for muscle spasms. What changed:  when to take this  reasons to take this   DULoxetine 60 MG capsule Commonly known as:  CYMBALTA Take 60 mg by mouth every evening.   esomeprazole 20 MG capsule Commonly known as:  NEXIUM Take 20 mg by mouth daily before breakfast.   gabapentin 400 MG  capsule Commonly known as:  NEURONTIN Take 400 mg by mouth 4 (four) times daily.   HYDROcodone-acetaminophen 5-325 MG tablet Commonly known as:  NORCO Take 1-2 tablets by mouth every 4 (four) hours as needed for moderate pain.   ibuprofen 200 MG tablet Commonly known as:  ADVIL,MOTRIN Take 200-600 mg by mouth every 8 (eight) hours as needed (for pain.).   levothyroxine 75 MCG tablet Commonly known as:  SYNTHROID, LEVOTHROID Take 75 mcg by mouth daily before breakfast.   MOVE FREE PO Take 1 tablet by mouth at bedtime.   senna-docusate 8.6-50 MG tablet Commonly known as:  Senokot-S Take 2 tablets by mouth at  bedtime.      Follow-up Information    Loura Halt Ditty, MD. Schedule an appointment as soon as possible for a visit in 3 week(s).   Specialty:  Neurosurgery Contact information: [address] [employer] [address] [place] [address] [phone]           Signed: Alyson Ingles 12/22/2016, 3:23 PM

## 2016-12-22 NOTE — Anesthesia Postprocedure Evaluation (Signed)
Anesthesia Post Note  Patient: Laura Burnett  Procedure(s) Performed: Procedure(s) (LRB): Lumbar Five-Sacral One Bilateral facetectomies/posterior lumbar interbody fusion/cortical trajectory screw fixation/mazor robot (Bilateral) APPLICATION OF ROBOTIC ASSISTANCE FOR SPINAL PROCEDURE (N/A)  Patient location during evaluation: PACU Anesthesia Type: General Level of consciousness: awake and alert Pain management: pain level controlled Vital Signs Assessment: post-procedure vital signs reviewed and stable Respiratory status: spontaneous breathing, nonlabored ventilation, respiratory function stable and patient connected to nasal cannula oxygen Cardiovascular status: blood pressure returned to baseline and stable Postop Assessment: no signs of nausea or vomiting Anesthetic complications: no       Last Vitals:  Vitals:   12/22/16 0800 12/22/16 1139  BP: 102/77 114/78  Pulse: 90 89  Resp: 18 18  Temp: 37.3 C 36.9 C    Last Pain:  Vitals:   12/22/16 1254  TempSrc:   PainSc: [place]

## 2016-12-25 ENCOUNTER — Encounter (HOSPITAL_COMMUNITY): Payer: Self-pay | Admitting: Neurological Surgery

## 2017-03-12 NOTE — Anesthesia Postprocedure Evaluation (Signed)
Anesthesia Post Note  Patient: Laura Burnett  Procedure(s) Performed: Procedure(s) (LRB): Lumbar Five-Sacral One Bilateral facetectomies/posterior lumbar interbody fusion/cortical trajectory screw fixation/mazor robot (Bilateral) APPLICATION OF ROBOTIC ASSISTANCE FOR SPINAL PROCEDURE (N/A)     Anesthesia Post Evaluation  Last Vitals:  Vitals:   12/22/16 0800 12/22/16 1139  BP: 102/77 114/78  Pulse: 90 89  Resp: 18 18  Temp: 37.3 C 36.9 C    Last Pain:  Vitals:   12/22/16 1254  TempSrc:   PainSc: [place]

## 2017-03-12 NOTE — Addendum Note (Signed)
Addendum  created 03/12/17 1135 by , , MD   Sign clinical note    
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
## 2017-03-16 ENCOUNTER — Ambulatory Visit (INDEPENDENT_AMBULATORY_CARE_PROVIDER_SITE_OTHER): Payer: BLUE CROSS/BLUE SHIELD | Admitting: Psychiatry

## 2017-03-16 ENCOUNTER — Encounter (HOSPITAL_COMMUNITY): Payer: Self-pay | Admitting: Psychiatry

## 2017-03-16 VITALS — BP 96/75 | HR 98 | Ht 63.0 in | Wt 190.0 lb

## 2017-03-16 DIAGNOSIS — M4807 Spinal stenosis, lumbosacral region: Secondary | ICD-10-CM | POA: Diagnosis not present

## 2017-03-16 DIAGNOSIS — M797 Fibromyalgia: Secondary | ICD-10-CM | POA: Diagnosis not present

## 2017-03-16 DIAGNOSIS — Z87891 Personal history of nicotine dependence: Secondary | ICD-10-CM

## 2017-03-16 DIAGNOSIS — M47896 Other spondylosis, lumbar region: Secondary | ICD-10-CM

## 2017-03-16 DIAGNOSIS — E039 Hypothyroidism, unspecified: Secondary | ICD-10-CM

## 2017-03-16 DIAGNOSIS — F4321 Adjustment disorder with depressed mood: Secondary | ICD-10-CM | POA: Diagnosis not present

## 2017-03-16 DIAGNOSIS — F419 Anxiety disorder, unspecified: Secondary | ICD-10-CM | POA: Diagnosis not present

## 2017-03-16 MED ORDER — DULOXETINE HCL 30 MG PO CPEP: 90.0000 mg | ORAL_CAPSULE | Freq: Every day | ORAL | 1 refills | Status: DC

## 2017-03-16 MED ORDER — DIAZEPAM 2 MG PO TABS: 2.0000 mg | ORAL_TABLET | Freq: Two times a day (BID) | ORAL | 0 refills | Status: DC | PRN

## 2017-03-16 NOTE — Patient Instructions (Signed)
1. Increase duloxetine 90 mg daily 2. Decrease valium 2 mg twice a day as needed for anxiety 3. Return to clinic in one month for 30 mins 4. Referral to therapy

## 2017-04-02 ENCOUNTER — Encounter: Payer: Self-pay | Admitting: Endocrinology
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
## 2017-04-08 ENCOUNTER — Encounter (HOSPITAL_COMMUNITY): Payer: Self-pay | Admitting: Licensed Clinical Social Worker

## 2017-04-08 ENCOUNTER — Other Ambulatory Visit: Payer: Self-pay | Admitting: Nurse Practitioner

## 2017-04-08 ENCOUNTER — Ambulatory Visit (INDEPENDENT_AMBULATORY_CARE_PROVIDER_SITE_OTHER): Payer: BLUE CROSS/BLUE SHIELD | Admitting: Licensed Clinical Social Worker

## 2017-04-08 DIAGNOSIS — F0631 Mood disorder due to known physiological condition with depressive features: Secondary | ICD-10-CM

## 2017-04-08 DIAGNOSIS — F419 Anxiety disorder, unspecified: Secondary | ICD-10-CM

## 2017-04-08 DIAGNOSIS — E049 Nontoxic goiter, unspecified: Secondary | ICD-10-CM
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
## 2017-04-12 ENCOUNTER — Ambulatory Visit
Admission: RE | Admit: 2017-04-12 | Discharge: 2017-04-12 | Disposition: A | Discharge status: Home / Self Care | Payer: BLUE CROSS/BLUE SHIELD | Source: Ambulatory Visit | Attending: Nurse Practitioner | Admitting: Nurse Practitioner

## 2017-04-12 ENCOUNTER — Ambulatory Visit (INDEPENDENT_AMBULATORY_CARE_PROVIDER_SITE_OTHER): Payer: BLUE CROSS/BLUE SHIELD | Admitting: Psychiatry

## 2017-04-12 VITALS — BP 94/68 | HR 75 | Ht 62.99 in | Wt 188.2 lb

## 2017-04-12 DIAGNOSIS — Z79899 Other long term (current) drug therapy: Secondary | ICD-10-CM

## 2017-04-12 DIAGNOSIS — F419 Anxiety disorder, unspecified: Secondary | ICD-10-CM | POA: Diagnosis not present

## 2017-04-12 DIAGNOSIS — M797 Fibromyalgia: Secondary | ICD-10-CM | POA: Diagnosis not present

## 2017-04-12 DIAGNOSIS — F4321 Adjustment disorder with depressed mood: Secondary | ICD-10-CM

## 2017-04-12 DIAGNOSIS — E049 Nontoxic goiter, unspecified: Secondary | ICD-10-CM

## 2017-04-12 MED ORDER — DULOXETINE HCL 60 MG PO CPEP: 120.0000 mg | ORAL_CAPSULE | Freq: Every day | ORAL | 1 refills | Status: DC

## 2017-04-12 MED ORDER — DIAZEPAM 2 MG PO TABS
2.0000 mg | ORAL_TABLET | Freq: Two times a day (BID) | ORAL | 0 refills | Status: DC | PRN
Start: 2017-04-12 — End: 2017-05-24

## 2017-04-12 NOTE — Patient Instructions (Addendum)
1. Increase duloxetine 120 mg daily 2. Continue valium 2 mg twice a day as needed for anxiety 3  Return to clinic in one month for 30 mins

## 2017-04-20 ENCOUNTER — Ambulatory Visit: Payer: Self-pay | Admitting: Endocrinology

## 2017-04-21 ENCOUNTER — Encounter: Payer: Self-pay | Admitting: Endocrinology

## 2017-04-21 ENCOUNTER — Ambulatory Visit (INDEPENDENT_AMBULATORY_CARE_PROVIDER_SITE_OTHER): Payer: BLUE CROSS/BLUE SHIELD | Admitting: Endocrinology

## 2017-04-21 VITALS — BP 112/62 | HR 106 | Wt 193.0 lb

## 2017-04-21 DIAGNOSIS — E039 Hypothyroidism, unspecified: Secondary | ICD-10-CM

## 2017-04-21 DIAGNOSIS — R490 Dysphonia: Secondary | ICD-10-CM | POA: Diagnosis not present

## 2017-04-21 MED ORDER — LEVOTHYROXINE SODIUM 88 MCG PO TABS: 88.0000 ug | ORAL_TABLET | Freq: Every day | ORAL | 3 refills | Status: DC

## 2017-04-21 NOTE — Patient Instructions (Addendum)
I have sent a prescription to your pharmacy, to increase the thyroid pill. Please come back to recheck the blood tests, in 1 month.  Please see an ear-nose-throat specialist.  you will receive a phone call, about a day and time for an appointment Please return in 1 year, or sooner if necessary.
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
## 2017-04-27 ENCOUNTER — Telehealth: Payer: Self-pay | Admitting: Endocrinology

## 2017-04-27 NOTE — Telephone Encounter (Signed)
Fax sent of progress notes but we have no recent labs. There are only future lab orders placed.

## 2017-04-27 NOTE — Telephone Encounter (Signed)
Routing to you °

## 2017-04-27 NOTE — Telephone Encounter (Signed)
Please fax office notes/labs for referral to Dr. Lucia Gaskins to [phone]. Patient is in office now.

## 2017-04-29 ENCOUNTER — Ambulatory Visit (INDEPENDENT_AMBULATORY_CARE_PROVIDER_SITE_OTHER): Payer: BLUE CROSS/BLUE SHIELD | Admitting: Licensed Clinical Social Worker

## 2017-04-29 DIAGNOSIS — F419 Anxiety disorder, unspecified: Secondary | ICD-10-CM | POA: Diagnosis not present

## 2017-04-29 DIAGNOSIS — F0631 Mood disorder due to known physiological condition with depressive features: Secondary | ICD-10-CM
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
## 2017-05-07 ENCOUNTER — Telehealth (HOSPITAL_COMMUNITY): Payer: Self-pay | Admitting: *Deleted

## 2017-05-07 NOTE — Telephone Encounter (Signed)
Office received refill request from pt pharmacy [facility] in [place] for Valium. Per pt chart, pt should not be out of med. Called pt to get more information. Per pt she did not mean to request that medication and she did give the last printed script to her pharmacy.

## 2017-05-10 ENCOUNTER — Telehealth (HOSPITAL_COMMUNITY): Payer: Self-pay | Admitting: *Deleted

## 2017-05-10 NOTE — Telephone Encounter (Signed)
Pt pharmacy [facility] in [place] requesting refill for pt Cymbalta 30 mg TID. Per pt chart, pt is taking Cymbalta 60 mg BID. Per pt chart, pt medication was last filled on 04-12-2017 with 60 tabs 1 refill and it was sent to [facility] in [place]. Pt f/u appt with provider is 05-12-2017.
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
## 2017-05-10 NOTE — Telephone Encounter (Signed)
noted 

## 2017-05-10 NOTE — Telephone Encounter (Signed)
Will discuss this at her next visit

## 2017-05-11 ENCOUNTER — Telehealth (HOSPITAL_COMMUNITY): Payer: Self-pay | Admitting: *Deleted

## 2017-05-11 NOTE — Telephone Encounter (Signed)
Returned phone call to patient regarding scheduling an appointment.  left voice message.

## 2017-05-12 ENCOUNTER — Ambulatory Visit (HOSPITAL_COMMUNITY): Payer: BLUE CROSS/BLUE SHIELD | Admitting: Psychiatry

## 2017-05-14 ENCOUNTER — Telehealth (HOSPITAL_COMMUNITY): Payer: Self-pay | Admitting: *Deleted

## 2017-05-14 NOTE — Telephone Encounter (Signed)
noted 

## 2017-05-14 NOTE — Telephone Encounter (Signed)
Pt pharmacy requesting 90 days supply for pt Duloxetine 30 mg 3 caps QD. Pt medication was last filled on 04-12-2017 with 60 caps 1 refill. Pt f/u appt is 05-24-2017. Pt pharmacy is [facility] in [place].

## 2017-05-14 NOTE — Telephone Encounter (Signed)
Pt pharmacy faxed office refill request for pt Valium 2 mg BID PRN. Per pt chart, pt medication was last filled on 04-12-2017 with 60 tabs 0 refill. Pt pharmacy name is [facility] in [place].

## 2017-05-14 NOTE — Telephone Encounter (Signed)
Will not do 90 days at this time
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
## 2017-05-20 ENCOUNTER — Other Ambulatory Visit: Payer: Self-pay | Admitting: Endocrinology

## 2017-05-20 ENCOUNTER — Other Ambulatory Visit (INDEPENDENT_AMBULATORY_CARE_PROVIDER_SITE_OTHER): Payer: BLUE CROSS/BLUE SHIELD

## 2017-05-20 DIAGNOSIS — E039 Hypothyroidism, unspecified: Secondary | ICD-10-CM | POA: Diagnosis not present

## 2017-05-20 LAB — TSH: TSH: 4.79 u[IU]/mL — ABNORMAL HIGH (ref 0.35–4.50)

## 2017-05-20 LAB — T3, FREE: T3, Free: 2.8 pg/mL (ref 2.3–4.2)

## 2017-05-20 LAB — T4, FREE: Free T4: 0.74 ng/dL (ref 0.60–1.60)

## 2017-05-20 MED ORDER — LEVOTHYROXINE SODIUM 100 MCG PO TABS: 100.0000 ug | ORAL_TABLET | Freq: Every day | ORAL | 3 refills | Status: DC

## 2017-05-21 ENCOUNTER — Other Ambulatory Visit: Payer: Self-pay

## 2017-05-24 ENCOUNTER — Encounter (HOSPITAL_COMMUNITY): Payer: Self-pay | Admitting: Psychiatry

## 2017-05-24 ENCOUNTER — Ambulatory Visit (INDEPENDENT_AMBULATORY_CARE_PROVIDER_SITE_OTHER): Payer: BLUE CROSS/BLUE SHIELD | Admitting: Psychiatry

## 2017-05-24 VITALS — BP 95/72 | HR 101 | Ht 62.99 in | Wt 193.8 lb

## 2017-05-24 DIAGNOSIS — F0631 Mood disorder due to known physiological condition with depressive features: Secondary | ICD-10-CM | POA: Diagnosis not present

## 2017-05-24 DIAGNOSIS — Z87891 Personal history of nicotine dependence: Secondary | ICD-10-CM

## 2017-05-24 DIAGNOSIS — F4321 Adjustment disorder with depressed mood: Secondary | ICD-10-CM | POA: Diagnosis not present

## 2017-05-24 DIAGNOSIS — F419 Anxiety disorder, unspecified: Secondary | ICD-10-CM | POA: Diagnosis not present

## 2017-05-24 DIAGNOSIS — Z91411 Personal history of adult psychological abuse: Secondary | ICD-10-CM | POA: Diagnosis not present

## 2017-05-24 DIAGNOSIS — M797 Fibromyalgia: Secondary | ICD-10-CM | POA: Diagnosis not present

## 2017-05-24 DIAGNOSIS — E039 Hypothyroidism, unspecified: Secondary | ICD-10-CM

## 2017-05-24 DIAGNOSIS — M47897 Other spondylosis, lumbosacral region: Secondary | ICD-10-CM

## 2017-05-24 MED ORDER — DULOXETINE HCL 60 MG PO CPEP: 120.0000 mg | ORAL_CAPSULE | Freq: Every day | ORAL | 1 refills | Status: DC

## 2017-05-24 MED ORDER — DIAZEPAM 2 MG PO TABS: 2.0000 mg | ORAL_TABLET | Freq: Two times a day (BID) | ORAL | 0 refills | Status: DC | PRN

## 2017-05-24 NOTE — Patient Instructions (Signed)
1. Continue duloxetine 120 mg daily 2. Continue valium 2 mg twice a day as needed for anxiety  3. Return to clinic in one month for 30 mins

## 2017-05-31 ENCOUNTER — Ambulatory Visit (HOSPITAL_COMMUNITY): Payer: BLUE CROSS/BLUE SHIELD | Admitting: Licensed Clinical Social Worker

## 2017-06-05 ENCOUNTER — Telehealth (HOSPITAL_COMMUNITY): Payer: Self-pay | Admitting: *Deleted

## 2017-06-05 NOTE — Telephone Encounter (Signed)
Pt pharmacy faxed office refill request for pt Valium 2 mg BID PRN. Per pt chart, pt medication was last filled on 05-24-2017 with 60 tabs 0 refill. Pt pharmacy name is [facility] in [place].

## 2017-06-06 NOTE — Telephone Encounter (Signed)
She should have enough medication until the next appt.

## 2017-06-07 NOTE — Telephone Encounter (Signed)
noted 

## 2017-06-08 ENCOUNTER — Other Ambulatory Visit: Payer: Self-pay | Admitting: Internal Medicine

## 2017-06-08 ENCOUNTER — Ambulatory Visit
Admission: RE | Admit: 2017-06-08 | Discharge: 2017-06-08 | Disposition: A | Discharge status: Home / Self Care | Payer: BLUE CROSS/BLUE SHIELD | Source: Ambulatory Visit | Attending: Internal Medicine | Admitting: Internal Medicine

## 2017-06-08 DIAGNOSIS — J209 Acute bronchitis, unspecified: Secondary | ICD-10-CM
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
## 2017-06-23 ENCOUNTER — Ambulatory Visit (HOSPITAL_COMMUNITY): Payer: Self-pay | Admitting: Psychiatry

## 2017-06-29 ENCOUNTER — Telehealth (HOSPITAL_COMMUNITY): Payer: Self-pay | Admitting: *Deleted

## 2017-06-29 NOTE — Telephone Encounter (Signed)
returned phone call to patient to schedule an appointment, no answer, left voice message.

## 2017-07-15 ENCOUNTER — Telehealth (HOSPITAL_COMMUNITY): Payer: Self-pay | Admitting: *Deleted

## 2017-07-15 NOTE — Telephone Encounter (Signed)
returned phone call to patient this a.m. to schedule appointment with Laura Burnett.   She said she has had a change in insurance, she now has Medicaid and she wants to make sure that it is in effect before she schedules.   She will call back.

## 2017-08-17 ENCOUNTER — Encounter (HOSPITAL_COMMUNITY): Payer: Self-pay | Admitting: Emergency Medicine

## 2017-08-17 ENCOUNTER — Other Ambulatory Visit: Payer: Self-pay

## 2017-08-17 ENCOUNTER — Emergency Department (HOSPITAL_COMMUNITY): Payer: BLUE CROSS/BLUE SHIELD

## 2017-08-17 ENCOUNTER — Emergency Department (HOSPITAL_COMMUNITY)
Admission: EM | Admit: 2017-08-17 | Discharge: 2017-08-17 | Disposition: A | Discharge status: Home / Self Care | Payer: BLUE CROSS/BLUE SHIELD | Attending: Emergency Medicine | Admitting: Emergency Medicine

## 2017-08-17 DIAGNOSIS — E039 Hypothyroidism, unspecified: Secondary | ICD-10-CM | POA: Insufficient documentation

## 2017-08-17 DIAGNOSIS — Z79899 Other long term (current) drug therapy: Secondary | ICD-10-CM | POA: Insufficient documentation

## 2017-08-17 DIAGNOSIS — Z87891 Personal history of nicotine dependence: Secondary | ICD-10-CM | POA: Insufficient documentation

## 2017-08-17 DIAGNOSIS — M65311 Trigger thumb, right thumb: Secondary | ICD-10-CM | POA: Insufficient documentation

## 2017-08-17 HISTORY — DX: Unspecified osteoarthritis, unspecified site: M19.90

## 2017-08-17 MED ORDER — NAPROXEN 500 MG PO TABS: 500.0000 mg | ORAL_TABLET | Freq: Two times a day (BID) | ORAL | 0 refills | Status: DC

## 2017-08-17 NOTE — ED Triage Notes (Signed)
PT c/o right hand thumb pain with ROM and touch x10 days with no known injury.

## 2017-08-17 NOTE — Discharge Instructions (Signed)
Please read the attached information regarding her condition. Wear finger splint as directed especially at night. Take naproxen scheduled for the next 3-5 days. Follow-up with the orthopedist listed below for further evaluation. Return to ED for worsening Symptoms, injuries or falls, red hot or tender joint, numbness in the hands or arms.
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
## 2017-08-17 NOTE — ED Notes (Signed)
Finger splint applied to left thumb. Patient tolerated well.

## 2017-08-18 ENCOUNTER — Encounter (HOSPITAL_COMMUNITY): Payer: Self-pay | Admitting: Psychiatry

## 2017-08-18 ENCOUNTER — Ambulatory Visit (INDEPENDENT_AMBULATORY_CARE_PROVIDER_SITE_OTHER): Payer: Self-pay | Admitting: Psychiatry

## 2017-08-18 VITALS — BP 113/80 | HR 86 | Ht 62.0 in | Wt 200.0 lb

## 2017-08-18 DIAGNOSIS — Z87891 Personal history of nicotine dependence: Secondary | ICD-10-CM

## 2017-08-18 DIAGNOSIS — R450 Nervousness: Secondary | ICD-10-CM

## 2017-08-18 DIAGNOSIS — Z560 Unemployment, unspecified: Secondary | ICD-10-CM

## 2017-08-18 DIAGNOSIS — F419 Anxiety disorder, unspecified: Secondary | ICD-10-CM

## 2017-08-18 DIAGNOSIS — M2550 Pain in unspecified joint: Secondary | ICD-10-CM

## 2017-08-18 DIAGNOSIS — F4320 Adjustment disorder, unspecified: Secondary | ICD-10-CM

## 2017-08-18 MED ORDER — DIAZEPAM 2 MG PO TABS: 2.0000 mg | ORAL_TABLET | Freq: Two times a day (BID) | ORAL | 1 refills | Status: DC | PRN

## 2017-08-18 MED ORDER — DULOXETINE HCL 60 MG PO CPEP: 120.0000 mg | ORAL_CAPSULE | Freq: Every day | ORAL | 0 refills | Status: DC

## 2017-08-18 NOTE — Patient Instructions (Addendum)
1. Continue duloxetine 120 mg daily 2. Continue valium 2 mg twice a day as needed for anxiety  3. Return to clinic as needed
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
## 2017-09-01 ENCOUNTER — Telehealth: Payer: Self-pay | Admitting: Orthopedic Surgery

## 2017-09-01 NOTE — Telephone Encounter (Signed)
Patient had called, per voice mail message, to request appointment following Laura Burnett Emergency room visit for problem of trigger thumb.  Called back, reached voice mail, left message.

## 2017-09-02 NOTE — Telephone Encounter (Signed)
Called back to patient, offered appointment 

## 2017-09-10 ENCOUNTER — Ambulatory Visit (INDEPENDENT_AMBULATORY_CARE_PROVIDER_SITE_OTHER): Payer: Self-pay | Admitting: Orthopedic Surgery

## 2017-09-10 ENCOUNTER — Encounter: Payer: Self-pay | Admitting: Orthopedic Surgery

## 2017-09-10 VITALS — BP 125/87 | HR 110 | Ht 62.0 in | Wt 197.0 lb

## 2017-09-10 DIAGNOSIS — M6530 Trigger finger, unspecified finger: Secondary | ICD-10-CM
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
## 2017-09-10 NOTE — Patient Instructions (Addendum)
Continue splinting  If no improvement after 2 weeks call for new appointment   Trigger Finger Trigger finger (stenosing tenosynovitis) is a condition that causes a finger to get stuck in a bent position. Each finger has a tough, cord-like tissue that connects muscle to bone (tendon), and each tendon is surrounded by a tunnel of tissue (tendon sheath). To move your finger, your tendon needs to slide freely through the sheath. Trigger finger happens when the tendon or the sheath thickens, making it difficult to move your finger. Trigger finger can affect any finger or a thumb. It may affect more than one finger. Mild cases may clear up with rest and medicine. Severe cases require more treatment. What are the causes? Trigger finger is caused by a thickened finger tendon or tendon sheath. The cause of this thickening is not known. What increases the risk? The following factors may make you more likely to develop this condition:  Doing activities that require a strong grip.  Having rheumatoid arthritis, gout, or diabetes.  Being 60-66 years old.  Being a woman.  What are the signs or symptoms? Symptoms of this condition include:  Pain when bending or straightening your finger.  Tenderness or swelling where your finger attaches to the palm of your hand.  A lump in the palm of your hand or on the inside of your finger.  Hearing a popping sound when you try to straighten your finger.  Feeling a popping, catching, or locking sensation when you try to straighten your finger.  Being unable to straighten your finger.  How is this diagnosed? This condition is diagnosed based on your symptoms and a physical exam. How is this treated? This condition may be treated by:  Resting your finger and avoiding activities that make symptoms worse.  Wearing a finger splint to keep your finger in a slightly bent position.  Taking NSAIDs to relieve pain and swelling.  Injecting medicine (steroids)  into the tendon sheath to reduce swelling and irritation. Injections may need to be repeated.  Having surgery to open the tendon sheath. This may be done if other treatments do not work and you cannot straighten your finger. You may need physical therapy after surgery.  Follow these instructions at home:  Use moist heat to help reduce pain and swelling as told by your health care provider.  Rest your finger and avoid activities that make pain worse. Return to normal activities as told by your health care provider.  If you have a splint, wear it as told by your health care provider.  Take over-the-counter and prescription medicines only as told by your health care provider.  Keep all follow-up visits as told by your health care provider. This is important. Contact a health care provider if:  Your symptoms are not improving with home care. Summary  Trigger finger (stenosing tenosynovitis) causes your finger to get stuck in a bent position, and it can make it difficult and painful to straighten your finger.  This condition develops when a finger tendon or tendon sheath thickens.  Treatment starts with resting, wearing a splint, and taking NSAIDs.  In severe cases, surgery to open the tendon sheath may be needed. This information is not intended to replace advice given to you by your health care provider. Make sure you discuss any questions you have with your health care provider. Document Released: 06/06/2004 Document Revised: 07/28/2016 Document Reviewed: 07/28/2016 Elsevier Interactive Patient Education  2017 [employer].

## 2017-09-29 ENCOUNTER — Encounter (INDEPENDENT_AMBULATORY_CARE_PROVIDER_SITE_OTHER): Payer: Self-pay | Admitting: Orthopaedic Surgery

## 2017-09-29 ENCOUNTER — Ambulatory Visit (INDEPENDENT_AMBULATORY_CARE_PROVIDER_SITE_OTHER): Payer: Self-pay | Admitting: Orthopaedic Surgery

## 2017-09-29 VITALS — BP 140/93 | HR 120 | Ht 62.0 in | Wt 197.0 lb

## 2017-09-29 DIAGNOSIS — Z981 Arthrodesis status: Secondary | ICD-10-CM

## 2017-09-29 NOTE — Addendum Note (Signed)
Addended by: Meyer Cory on: 09/29/2017 05:15 PM   Modules accepted: Orders
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
## 2017-10-05 ENCOUNTER — Ambulatory Visit (HOSPITAL_COMMUNITY)
Admission: RE | Admit: 2017-10-05 | Discharge: 2017-10-05 | Disposition: A | Discharge status: Home / Self Care | Payer: Self-pay | Source: Ambulatory Visit | Attending: Orthopaedic Surgery | Admitting: Orthopaedic Surgery

## 2017-10-05 DIAGNOSIS — Z9889 Other specified postprocedural states: Secondary | ICD-10-CM | POA: Insufficient documentation

## 2017-10-05 DIAGNOSIS — Z981 Arthrodesis status: Secondary | ICD-10-CM | POA: Insufficient documentation

## 2017-10-05 DIAGNOSIS — R9389 Abnormal findings on diagnostic imaging of other specified body structures: Secondary | ICD-10-CM | POA: Insufficient documentation
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
## 2017-10-07 ENCOUNTER — Ambulatory Visit (HOSPITAL_COMMUNITY): Payer: Self-pay | Admitting: Psychiatry

## 2017-10-07 ENCOUNTER — Ambulatory Visit (INDEPENDENT_AMBULATORY_CARE_PROVIDER_SITE_OTHER): Payer: Self-pay

## 2017-10-07 ENCOUNTER — Encounter (INDEPENDENT_AMBULATORY_CARE_PROVIDER_SITE_OTHER): Payer: Self-pay | Admitting: Orthopaedic Surgery

## 2017-10-07 ENCOUNTER — Ambulatory Visit (INDEPENDENT_AMBULATORY_CARE_PROVIDER_SITE_OTHER): Payer: Medicaid Other | Admitting: Orthopaedic Surgery

## 2017-10-07 VITALS — BP 95/69 | HR 110

## 2017-10-07 DIAGNOSIS — M542 Cervicalgia: Secondary | ICD-10-CM

## 2017-10-07 DIAGNOSIS — M545 Low back pain, unspecified: Secondary | ICD-10-CM

## 2017-10-12 ENCOUNTER — Ambulatory Visit (HOSPITAL_COMMUNITY)
Admission: RE | Admit: 2017-10-12 | Discharge: 2017-10-12 | Disposition: A | Discharge status: Home / Self Care | Payer: Self-pay | Source: Ambulatory Visit | Attending: Orthopaedic Surgery | Admitting: Orthopaedic Surgery

## 2017-10-12 DIAGNOSIS — M542 Cervicalgia: Secondary | ICD-10-CM

## 2017-10-12 DIAGNOSIS — M5031 Other cervical disc degeneration,  high cervical region: Secondary | ICD-10-CM | POA: Insufficient documentation

## 2017-10-21 ENCOUNTER — Encounter (INDEPENDENT_AMBULATORY_CARE_PROVIDER_SITE_OTHER): Payer: Self-pay | Admitting: Orthopaedic Surgery

## 2017-10-21 ENCOUNTER — Ambulatory Visit (INDEPENDENT_AMBULATORY_CARE_PROVIDER_SITE_OTHER): Payer: Medicaid Other | Admitting: Orthopaedic Surgery

## 2017-10-21 VITALS — BP 104/81 | HR 132

## 2017-10-21 DIAGNOSIS — G8929 Other chronic pain: Secondary | ICD-10-CM

## 2017-10-21 DIAGNOSIS — M542 Cervicalgia: Secondary | ICD-10-CM

## 2017-10-21 DIAGNOSIS — M545 Low back pain: Secondary | ICD-10-CM

## 2017-10-22 ENCOUNTER — Encounter (INDEPENDENT_AMBULATORY_CARE_PROVIDER_SITE_OTHER): Payer: Self-pay | Admitting: Orthopaedic Surgery

## 2017-10-22 DIAGNOSIS — M545 Low back pain, unspecified: Secondary | ICD-10-CM | POA: Insufficient documentation
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
## 2017-10-22 NOTE — Progress Notes (Signed)
Office Visit Note   Patient: Laura Burnett           Date of Birth: May 27, 1966           MRN: [id number] Visit Date: 10/21/2017              Requested by: Dianna Rossetti, NP [facility] [address], [place] [address] PCP: Dianna Rossetti, NP   Assessment & Plan: Visit Diagnoses:  1. Neck pain   2. Chronic low back pain without sciatica, unspecified back pain laterality     Plan: We reviewed the MRI scan cervical spine I gave her a copy of the report.  She has some mild disc degeneration at C4-5, C5-6 and C6-7 without compression.  We discussed at this point no surgery is indicated for her cervical spine.  Impression noted on her CT scan lumbar spine.  She has some bone resorption around the  sacral screws.  I will recheck her in 3 months.  Follow-Up Instructions: Return in about 3 months (around 01/18/2018).   Orders:  No orders of the defined types were placed in this encounter.  No orders of the defined types were placed in this encounter.     Procedures: No procedures performed   Clinical Data: No additional findings.   Subjective: Chief Complaint  Patient presents with  . Neck - Pain, Follow-up    HPI 52 year old female returns with ongoing problems with neck pain also low back pain.  Previous lumbar fusion by Dr. Cyndy Freeze with CT scan suggesting pseudoarthrosis without residual compression.  Cage has remained in satisfactory position right and left.  Also gabapentin.  Cervical MRI scan has been obtained and is available for review.  Review of Systems unchanged from 10/07/17 office visit, 14 point system updated.   Objective: Vital Signs: BP 104/81   Pulse (!) 132   Physical Exam  Constitutional: She is oriented to person, place, and time. She appears well-developed.  HENT:  Head: Normocephalic.  Right Ear: External ear normal.  Left Ear: External ear normal.  Eyes: Pupils are equal, round, and reactive to light.  Neck: No tracheal deviation  present. No thyromegaly present.  Cardiovascular: Normal rate.  Pulmonary/Chest: Effort normal.  Abdominal: Soft.  Neurological: She is alert and oriented to person, place, and time.  Skin: Skin is warm and dry.  Psychiatric: She has a normal mood and affect. Her behavior is normal.    Ortho Exam well-healed lumbar incision midline.  Mild sciatic notch tenderness.  Negative logroll the hips.  Upper extremity reflexes are 2+ and symmetrical.  Right and left brachial plexus tenderness.  Clavicular lymphadenopathy.  Shoulder range of motion is full no impingement.  No upper extremity isolated weakness.   Specialty Comments:  No specialty comments available.  Imaging:CLINICAL DATA:  Neck pain for at least 20 years.  No known injury.  EXAM: MRI CERVICAL SPINE WITHOUT CONTRAST  TECHNIQUE: Multiplanar, multisequence MR imaging of the cervical spine was performed. No intravenous contrast was administered.  COMPARISON:  None.  FINDINGS: Alignment: Maintained.  Straightening of lordosis noted.  Vertebrae: Normal signal throughout.  No fracture.  Cord: Normal signal throughout.  Posterior Fossa, vertebral arteries, paraspinal tissues: Negative.  Disc levels:  C2-3: Negative.  C3-4: Minimal bulge without stenosis.  C4-5: Small central disc protrusion indents the ventral thecal sac but the central canal and foramina remain widely patent.  C5-6: Minimal posterior bony ridging and uncovertebral spurring without central canal or foraminal stenosis.  C6-7: Shallow  broad-based left paracentral protrusion without stenosis.  C7-T1: Negative.  IMPRESSION: Mild degenerative disc disease C4-5, C5-6 and C6-7 without central canal or foraminal narrowing. The exam is otherwise negative.   Electronically Signed   By: Inge Rise M.D.   On: 10/12/2017 13:44    PMFS History: Patient Active Problem List   Diagnosis Date Noted  . Chronic low back pain without  sciatica 10/22/2017  . Adjustment disorder with depressed mood 05/24/2017  . Hoarseness 04/21/2017  . Anxiety disorder 03/16/2017  . Lumbar radiculopathy 12/21/2016  . Neck pain 12/20/2015  . Hypothyroidism    Past Medical History:  Diagnosis Date  . Arthritis   . Depression   . DJD (degenerative joint disease)   . Fibromyalgia   . GERD (gastroesophageal reflux disease)   . Hypothyroidism   . PONV (postoperative nausea and vomiting)     Family History  Problem Relation Age of Onset  . Thyroid disease Sister   . Thyroid disease Brother     Past Surgical History:  Procedure Laterality Date  . APPLICATION OF ROBOTIC ASSISTANCE FOR SPINAL PROCEDURE N/A 12/21/2016   Procedure: APPLICATION OF ROBOTIC ASSISTANCE FOR SPINAL PROCEDURE;  Surgeon: Kevan Ny Ditty, MD;  Location: [facility];  Service: Neurosurgery;  Laterality: N/A;  . BACK SURGERY     L5-S1  . FOOT SURGERY    . HAND SURGERY     from [facility]  . KNEE ARTHROSCOPY     x2   Social History   Occupational History  . Not on file  Tobacco Use  . Smoking status: Former Research scientist (life sciences)  . Smokeless tobacco: Never Used  . Tobacco comment: 03-16-2017 per pt no   Substance and Sexual Activity  . Alcohol use: Yes    Comment: weekly, 03-16-2017 per pt 2-4 beer per week  . Drug use: No    Comment: 03-16-2017 per pt when she was 16 year  . Sexual activity: Not on file
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
## 2017-11-03 ENCOUNTER — Encounter (HOSPITAL_COMMUNITY): Payer: Self-pay | Admitting: Emergency Medicine

## 2017-11-03 ENCOUNTER — Emergency Department (HOSPITAL_COMMUNITY): Payer: Self-pay

## 2017-11-03 ENCOUNTER — Other Ambulatory Visit: Payer: Self-pay

## 2017-11-03 ENCOUNTER — Emergency Department (HOSPITAL_COMMUNITY)
Admission: EM | Admit: 2017-11-03 | Discharge: 2017-11-03 | Disposition: A | Discharge status: Home / Self Care | Payer: Self-pay | Attending: Emergency Medicine | Admitting: Emergency Medicine

## 2017-11-03 DIAGNOSIS — Z87891 Personal history of nicotine dependence: Secondary | ICD-10-CM | POA: Insufficient documentation

## 2017-11-03 DIAGNOSIS — E039 Hypothyroidism, unspecified: Secondary | ICD-10-CM | POA: Insufficient documentation

## 2017-11-03 DIAGNOSIS — J040 Acute laryngitis: Secondary | ICD-10-CM | POA: Insufficient documentation

## 2017-11-03 DIAGNOSIS — J209 Acute bronchitis, unspecified: Secondary | ICD-10-CM | POA: Insufficient documentation

## 2017-11-03 DIAGNOSIS — Z79899 Other long term (current) drug therapy: Secondary | ICD-10-CM | POA: Insufficient documentation

## 2017-11-03 MED ORDER — IBUPROFEN 800 MG PO TABS
800.0000 mg | ORAL_TABLET | Freq: Once | ORAL | Status: AC
  Administered 2017-11-03: 800 mg via ORAL
  Filled 2017-11-03: qty 1

## 2017-11-03 MED ORDER — AZITHROMYCIN 250 MG PO TABS: ORAL_TABLET | ORAL | 0 refills | Status: DC

## 2017-11-03 MED ORDER — BENZONATATE 100 MG PO CAPS: 200.0000 mg | ORAL_CAPSULE | Freq: Three times a day (TID) | ORAL | 0 refills | Status: DC | PRN

## 2017-11-03 MED ORDER — PREDNISONE 50 MG PO TABS: ORAL_TABLET | ORAL | 0 refills | Status: DC

## 2017-11-03 NOTE — Discharge Instructions (Signed)
Take the medicines as prescribed. Rest and make sure you are drinking plenty of fluids.  Hot tea with lemon and honey can be soothing to the throat.  I recommend Cepacol lozenges (or generic brand) for throat pain relief.  Rest your voice.

## 2017-11-03 NOTE — ED Triage Notes (Signed)
Pt reports productive cough and loss of voice x 2 days. Denies fever.

## 2017-11-04 ENCOUNTER — Ambulatory Visit (HOSPITAL_COMMUNITY): Payer: Self-pay | Admitting: Psychiatry
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
## 2017-11-09 ENCOUNTER — Emergency Department (HOSPITAL_COMMUNITY)
Admission: EM | Admit: 2017-11-09 | Discharge: 2017-11-09 | Disposition: A | Discharge status: Home / Self Care | Payer: Self-pay | Attending: Emergency Medicine | Admitting: Emergency Medicine

## 2017-11-09 ENCOUNTER — Emergency Department (HOSPITAL_COMMUNITY): Payer: Self-pay

## 2017-11-09 ENCOUNTER — Other Ambulatory Visit: Payer: Self-pay

## 2017-11-09 ENCOUNTER — Encounter (HOSPITAL_COMMUNITY): Payer: Self-pay

## 2017-11-09 DIAGNOSIS — J069 Acute upper respiratory infection, unspecified: Secondary | ICD-10-CM | POA: Insufficient documentation

## 2017-11-09 DIAGNOSIS — Z87891 Personal history of nicotine dependence: Secondary | ICD-10-CM | POA: Insufficient documentation

## 2017-11-09 DIAGNOSIS — Z79899 Other long term (current) drug therapy: Secondary | ICD-10-CM | POA: Insufficient documentation

## 2017-11-09 DIAGNOSIS — E039 Hypothyroidism, unspecified: Secondary | ICD-10-CM | POA: Insufficient documentation
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
## 2017-11-09 NOTE — ED Triage Notes (Signed)
Patient c/o sore throat, cough, bilateral ear pain - recently diagnosed with bronchitis and laryngitis.  Patient completed zithromax 2days ago and states she does not feel any better. Denies chest pain.
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
## 2017-11-09 NOTE — ED Notes (Signed)
Given po challenge per PA.

## 2017-11-09 NOTE — ED Notes (Signed)
Pt tolerated 1420 ml of fluid (water) challenge well.  HR 90 per C. Rinard, [place]

## 2017-11-09 NOTE — Discharge Instructions (Signed)
If you were given a prescription, please take the prescription as you were instructed and follow the directions given on the discharge paperwork.   Over the next several days you should rest as much as possible, and drink more fluids than usual. Liquids will help thin and loosen mucus so you can cough it up. Liquids will also help prevent dehydration. Using a cool mist humidifier or a vaporizer to increase air moisture in your home can also make it easier for you to breathe and help decrease your cough.  Rest your voice over the next several days. To help soothe a sore throat gargle with warm salt water.  Make salt water by dissolving  teaspoon salt in 1 cup warm water. You may also use throat lozenges and over the counter sore throat spray.  Please follow up with your primary care provider within 5-7 days for re-evaluation of your symptoms. If you do not have a primary care provider, information for a healthcare clinic has been provided for you to make arrangements for follow up care. Please return to the emergency department for any persistent fevers, worsening sore throat/hoarse voice, inability to swallow, persistent vomiting, chest pain, shortness of breath, coughing up blood, or any new or worsening symptoms.

## 2017-11-12 ENCOUNTER — Other Ambulatory Visit: Payer: Self-pay

## 2017-11-12 ENCOUNTER — Encounter (HOSPITAL_COMMUNITY): Payer: Self-pay | Admitting: Psychiatry

## 2017-11-12 ENCOUNTER — Ambulatory Visit (INDEPENDENT_AMBULATORY_CARE_PROVIDER_SITE_OTHER): Payer: Self-pay | Admitting: Psychiatry

## 2017-11-12 VITALS — BP 113/80 | HR 99 | Ht 62.0 in | Wt 204.0 lb

## 2017-11-12 DIAGNOSIS — R450 Nervousness: Secondary | ICD-10-CM

## 2017-11-12 DIAGNOSIS — M549 Dorsalgia, unspecified: Secondary | ICD-10-CM

## 2017-11-12 DIAGNOSIS — F419 Anxiety disorder, unspecified: Secondary | ICD-10-CM

## 2017-11-12 DIAGNOSIS — Z87891 Personal history of nicotine dependence: Secondary | ICD-10-CM

## 2017-11-12 DIAGNOSIS — G4700 Insomnia, unspecified: Secondary | ICD-10-CM

## 2017-11-12 DIAGNOSIS — F4321 Adjustment disorder with depressed mood: Secondary | ICD-10-CM

## 2017-11-12 MED ORDER — DULOXETINE HCL 60 MG PO CPEP: 120.0000 mg | ORAL_CAPSULE | Freq: Every day | ORAL | 0 refills | Status: DC

## 2017-11-12 MED ORDER — LEVOTHYROXINE SODIUM 100 MCG PO TABS: 100.0000 ug | ORAL_TABLET | Freq: Every day | ORAL | 3 refills | Status: DC

## 2017-11-12 NOTE — Patient Instructions (Addendum)
1. Continue duloxetine 120 mg daily  2. Continue valium 2 mg daily as needed for anxiety 3.Return to clinic in six weeks for 30 mins

## 2017-11-17 ENCOUNTER — Encounter: Payer: Self-pay | Admitting: Family Medicine

## 2017-11-17 ENCOUNTER — Other Ambulatory Visit: Payer: Self-pay

## 2017-11-17 ENCOUNTER — Ambulatory Visit (INDEPENDENT_AMBULATORY_CARE_PROVIDER_SITE_OTHER): Payer: Self-pay | Admitting: Family Medicine

## 2017-11-17 VITALS — BP 88/52 | HR 129 | Temp 98.5°F | Wt 207.4 lb

## 2017-11-17 DIAGNOSIS — M545 Low back pain, unspecified: Secondary | ICD-10-CM

## 2017-11-17 DIAGNOSIS — Z7689 Persons encountering health services in other specified circumstances: Secondary | ICD-10-CM

## 2017-11-17 DIAGNOSIS — J209 Acute bronchitis, unspecified: Secondary | ICD-10-CM

## 2017-11-17 MED ORDER — HYDROCOD POLST-CPM POLST ER 10-8 MG/5ML PO SUER: 5.0000 mL | Freq: Two times a day (BID) | ORAL | 0 refills | Status: DC | PRN

## 2017-11-17 MED ORDER — CYCLOBENZAPRINE HCL 10 MG PO TABS: 10.0000 mg | ORAL_TABLET | Freq: Three times a day (TID) | ORAL | 1 refills | Status: DC | PRN

## 2017-11-17 MED ORDER — LIDOCAINE 5 % EX OINT: 1.0000 "application " | TOPICAL_OINTMENT | Freq: Four times a day (QID) | CUTANEOUS | 0 refills | Status: DC | PRN

## 2017-11-17 NOTE — Patient Instructions (Addendum)
It was great meeting you today! I am sorry that you have been having such a hard time with your back pain. I think starting an ointment called lidocaine jelly will provide a lot of benefit. You can apply this 4 times per day. I would recommend using it when you wake up in the morning and before you go to sleep at night with one or two times interspersed throughout the day. I would like to see you back in 2-3 weeks to see how this is working for you. I refilled your prescription for the muscle relaxer.  In regards to your bronchitis, I think your cough is your most significant issue. I will give you tussionex cough medicine for this. Unfortunately we do not have any samples of albuterol here in the clinic today. I think we should have some available when I see you back in 2 weeks, but hopefully will not need it at that point.  I will draw some baseline labs. I will call you when these result. Likely in 1-2 days.

## 2017-11-18 LAB — COMPREHENSIVE METABOLIC PANEL
ALBUMIN: 4.3 g/dL (ref 3.5–5.5)
ALK PHOS: 168 IU/L — AB (ref 39–117)
ALT: 39 IU/L — AB (ref 0–32)
AST: 39 IU/L (ref 0–40)
Albumin/Globulin Ratio: 1.5 (ref 1.2–2.2)
BUN / CREAT RATIO: 7 — AB (ref 9–23)
BUN: 6 mg/dL (ref 6–24)
Bilirubin Total: 0.3 mg/dL (ref 0.0–1.2)
CHLORIDE: 103 mmol/L (ref 96–106)
CO2: 24 mmol/L (ref 20–29)
Calcium: 8.9 mg/dL (ref 8.7–10.2)
Creatinine, Ser: 0.86 mg/dL (ref 0.57–1.00)
GFR calc Af Amer: 89 mL/min/{1.73_m2} (ref >59)
GFR calc non Af Amer: 77 mL/min/{1.73_m2} (ref >59)
GLUCOSE: 83 mg/dL (ref 65–99)
Globulin, Total: 2.9 g/dL (ref 1.5–4.5)
POTASSIUM: 3.8 mmol/L (ref 3.5–5.2)
Sodium: 142 mmol/L (ref 134–144)
Total Protein: 7.2 g/dL (ref 6.0–8.5)

## 2017-11-18 LAB — CBC WITH DIFFERENTIAL/PLATELET
BASOS ABS: 0 10*3/uL (ref 0.0–0.2)
Basos: 0 %
EOS (ABSOLUTE): 0.3 10*3/uL (ref 0.0–0.4)
Eos: 3 %
Hematocrit: 38.1 % (ref 34.0–46.6)
Hemoglobin: 13.5 g/dL (ref 11.1–15.9)
Immature Grans (Abs): 0 10*3/uL (ref 0.0–0.1)
Immature Granulocytes: 0 %
LYMPHS ABS: 1.5 10*3/uL (ref 0.7–3.1)
Lymphs: 17 %
MCH: 29.9 pg (ref 26.6–33.0)
MCHC: 35.4 g/dL (ref 31.5–35.7)
MCV: 84 fL (ref 79–97)
Monocytes Absolute: 0.7 10*3/uL (ref 0.1–0.9)
Monocytes: 8 %
NEUTROS ABS: 6 10*3/uL (ref 1.4–7.0)
Neutrophils: 72 %
PLATELETS: 378 10*3/uL (ref 150–379)
RBC: 4.52 x10E6/uL (ref 3.77–5.28)
RDW: 14.9 % (ref 12.3–15.4)
WBC: 8.4 10*3/uL (ref 3.4–10.8)

## 2017-11-18 LAB — LIPID PANEL
CHOLESTEROL TOTAL: 223 mg/dL — AB (ref 100–199)
Chol/HDL Ratio: 4.1 ratio (ref 0.0–4.4)
HDL: 55 mg/dL (ref >39)
LDL Calculated: 123 mg/dL — ABNORMAL HIGH (ref 0–99)
Triglycerides: 227 mg/dL — ABNORMAL HIGH (ref 0–149)
VLDL CHOLESTEROL CAL: 45 mg/dL — AB (ref 5–40)

## 2017-11-18 LAB — TSH: TSH: 1.15 u[IU]/mL (ref 0.450–4.500)

## 2017-11-19 ENCOUNTER — Telehealth: Payer: Self-pay | Admitting: Family Medicine

## 2017-11-19 NOTE — Telephone Encounter (Signed)
Called patient and informed her of her lab results. CBC, CMP, and TSH all look good and within normal limits. Did inform her that her lipid panel was elevated and that we can discuss this at the next visit.  Laura Dawn MD PGY-1 Family Medicine Resident

## 2017-11-21 ENCOUNTER — Encounter: Payer: Self-pay | Admitting: Family Medicine

## 2017-11-21 DIAGNOSIS — J209 Acute bronchitis, unspecified: Secondary | ICD-10-CM | POA: Insufficient documentation

## 2017-11-21 DIAGNOSIS — Z7689 Persons encountering health services in other specified circumstances: Secondary | ICD-10-CM | POA: Insufficient documentation

## 2017-11-21 NOTE — Assessment & Plan Note (Signed)
Resolving bronchitis. Will give tussionex for cough as this appears to be her main issue. Expect this to resolve in the next 1-2 weeks.

## 2017-11-21 NOTE — Assessment & Plan Note (Signed)
Will trial lidocaine ointment for back pain. Suspect surgical fixture is likely only permanent solution. Patient without life-altering limitation so suspect she not a great operative candidate at this time.

## 2017-11-21 NOTE — Progress Notes (Signed)
  HPI:  Patient presents today for a new patient appointment to establish general primary care, also to discuss her back pain and chronic cough.  Has been having lumbar back pain for several years at this point. Patient has previously undergone lumbar fusion surgery. Apparently she has been re-evaluated and there "might be a screw loose" in her previous fixture. Patient sought second opinion and there appears to be agreement that there is no surgical option at this time.  She is also complaining of severe cough for the last month. Has been previously treated for bronchitis. Complains that her cough is her main remaining symptom and is causing her significant distress.  ROS: See HPI  Past Medical Hx:  - lumbar radicuolpathy - hypothyroidism -   Past Surgical Hx:  -l5/s1 fusion  Family Hx: updated in Epic  Social Hx: lives at home by herself - doesn't smoke - doesn't drink etoh   Health Maintenance:  -overdue on hep c, tdap, pap smear, colonoscopy, hiv  PHYSICAL EXAM: BP (!) 88/52   Pulse (!) 129   Temp 98.5 F (36.9 C) (Oral)   Wt 207 lb 6.4 oz (94.1 kg)   SpO2 95%   BMI 37.93 kg/m  Gen: alert, oriented. Well appearing caucasian female. No acute distress HEENT: bilateral TM intact, mmm, orophaynx without erythema Heart: rrr, no m/r/g, palpable peripheral pulses Lungs: lungs clear to ausculation bilaterally Abdomen: soft, ntnd. Bowel sounds + Neuro: no focal neuro deficits, 5/5 strength BUE, BLE. Cn 2-12 intact.  ASSESSMENT/PLAN:  # Health maintenance:  --overdue on hep c, tdap, pap smear, colonoscopy, hiv  Acute bronchitis Resolving bronchitis. Will give tussionex for cough as this appears to be her main issue. Expect this to resolve in the next 1-2 weeks.  Lumbar back pain Will trial lidocaine ointment for back pain. Suspect surgical fixture is likely only permanent solution. Patient without life-altering limitation so suspect she not a great operative candidate  at this time.     FOLLOW UP: Follow up in 2-3 weeks for follow up  Guadalupe Dawn MD PGY-1 [facility] Medicine Resident

## 2017-11-22 ENCOUNTER — Telehealth: Payer: Self-pay

## 2017-11-22 NOTE — Telephone Encounter (Signed)
Pt called nurse line, states her cough is no better and the cough medicine is not helping. Wants to know if she needs to come back in or if something can be called to her pharmacy. Her call back [phone] Wallace Cullens, RN

## 2017-11-23 ENCOUNTER — Emergency Department (HOSPITAL_COMMUNITY)
Admission: EM | Admit: 2017-11-23 | Discharge: 2017-11-23 | Disposition: A | Discharge status: Home / Self Care | Payer: Self-pay | Attending: Emergency Medicine | Admitting: Emergency Medicine

## 2017-11-23 ENCOUNTER — Emergency Department (HOSPITAL_COMMUNITY): Payer: Self-pay

## 2017-11-23 ENCOUNTER — Encounter (HOSPITAL_COMMUNITY): Payer: Self-pay | Admitting: Cardiology

## 2017-11-23 ENCOUNTER — Other Ambulatory Visit: Payer: Self-pay | Admitting: Family Medicine

## 2017-11-23 DIAGNOSIS — R05 Cough: Secondary | ICD-10-CM | POA: Insufficient documentation

## 2017-11-23 DIAGNOSIS — Z5321 Procedure and treatment not carried out due to patient leaving prior to being seen by health care provider: Secondary | ICD-10-CM | POA: Insufficient documentation

## 2017-11-23 MED ORDER — HYDROCOD POLST-CPM POLST ER 10-8 MG/5ML PO SUER: 5.0000 mL | Freq: Two times a day (BID) | ORAL | 0 refills | Status: DC | PRN

## 2017-11-23 NOTE — ED Notes (Signed)
Pt told registration she was leaving.  

## 2017-11-23 NOTE — ED Triage Notes (Signed)
Pt was treated for bronchitis 3 weeks ago.  Pt states she still has a cough.  Now has developed left sided rib pain when she coughs.

## 2017-11-23 NOTE — Telephone Encounter (Signed)
Called patient to discuss her cough and medication refill.  She went to the hospital today and got a CXR that showed chronic bronchitic changes but was stable otherwise.  Continues to experience significant cough.  Cough medicine reordered and other strategies such as hot tea with honey discussed.  Warned patient that this cough may linger for a long time and that unfortunately no medicine is very effective for cough.  Also told patient that while Augmentin may have seemed to work for her in the past, most bronchitis is caused by a virus and therefore would not respond to antibiotics.  She expressed understanding and will try to schedule an appointment for tomorrow.

## 2017-11-29 ENCOUNTER — Ambulatory Visit (INDEPENDENT_AMBULATORY_CARE_PROVIDER_SITE_OTHER): Payer: Self-pay | Admitting: Internal Medicine

## 2017-11-29 VITALS — BP 115/82 | HR 116 | Temp 97.9°F | Ht 62.0 in | Wt 205.0 lb

## 2017-11-29 DIAGNOSIS — R059 Cough, unspecified: Secondary | ICD-10-CM

## 2017-11-29 DIAGNOSIS — R05 Cough: Secondary | ICD-10-CM

## 2017-11-29 MED ORDER — FLUTICASONE PROPIONATE 50 MCG/ACT NA SUSP: 2.0000 | Freq: Every day | NASAL | 6 refills | Status: DC

## 2017-11-29 MED ORDER — CETIRIZINE HCL 10 MG PO TABS: 10.0000 mg | ORAL_TABLET | Freq: Every day | ORAL | 5 refills | Status: DC

## 2017-11-29 NOTE — Patient Instructions (Addendum)
  I want you to try certizine and flonase as discussed for two weeks, if you have no improvement at that point please return for follow up    Cough, Adult A cough helps to clear your throat and lungs. A cough may last only 2-3 weeks (acute), or it may last longer than 8 weeks (chronic). Many different things can cause a cough. A cough may be a sign of an illness or another medical condition. Follow these instructions at home:  Pay attention to any changes in your cough.  Take medicines only as told by your doctor. ? If you were prescribed an antibiotic medicine, take it as told by your doctor. Do not stop taking it even if you start to feel better. ? Talk with your doctor before you try using a cough medicine.  Drink enough fluid to keep your pee (urine) clear or pale yellow.  If the air is dry, use a cold steam vaporizer or humidifier in your home.  Stay away from things that make you cough at work or at home.  If your cough is worse at night, try using extra pillows to raise your head up higher while you sleep.  Do not smoke, and try not to be around smoke. If you need help quitting, ask your doctor.  Do not have caffeine.  Do not drink alcohol.  Rest as needed. Contact a doctor if:  You have new problems (symptoms).  You cough up yellow fluid (pus).  Your cough does not get better after 2-3 weeks, or your cough gets worse.  Medicine does not help your cough and you are not sleeping well.  You have pain that gets worse or pain that is not helped with medicine.  You have a fever.  You are losing weight and you do not know why.  You have night sweats. Get help right away if:  You cough up blood.  You have trouble breathing.  Your heartbeat is very fast. This information is not intended to replace advice given to you by your health care provider. Make sure you discuss any questions you have with your health care provider. Document Released: 04/30/2011 Document  Revised: 01/23/2016 Document Reviewed: 10/24/2014 Elsevier Interactive Patient Education  [employer].

## 2017-11-29 NOTE — Progress Notes (Signed)
   [facility] Kerrin Mo, MD Phone: [phone]  Reason For Visit: SDA for Cough   Onset: about 4 weeks. Patient feels sore from all the coughing. Patient states when she gets dry she has cough. In mornings patient coughs up green-yellow stuff. Patient states that the cough is worse at night and in the morning. Worse with: smelling smoke Better with: the more liquids patient drinks improves the cough. Denies smoking in the past 72 years, son smokes but no in the house.   Symptoms Sputum:yes  Fever: no  Shortness of breath:yes - when patient is coughing  Leg Swelling:no  Heart Burn or Reflux:yes - currently on protonix - patient has been taking 20 mg in the AM/PM Wheezing:no  Post Nasal Drip: yes   Red Flags Weight Loss:  no Immunocompromised:  no  PMH Asthma or COPD: as a young adult - exercise induced   PMH of Smoking: no  Using ACEIs: no   Past Medical History Reviewed problem list.  Medications- reviewed and updated No additions to family history  Objective: BP 115/82 (BP Location: Right Arm, Patient Position: Sitting, Cuff Size: Normal)   Pulse (!) 116   Temp 97.9 F (36.6 C) (Oral)   Ht 5\' 2"  (1.575 m)   Wt 205 lb (93 kg)   SpO2 97%   BMI 37.49 kg/m  Gen: NAD, alert, cooperative with exam HEENT: Normal    Neck: No masses palpated. No lymphadenopathy    Ears: Tympanic membranes intact, normal light reflex, no erythema, no bulging    Eyes: Conjunctive are within normal limits    Nose: nasal turbinates moist congested     Throat: post- nasal drip, moist mucus membranes, Cardio: regular rate and rhythm, S1S2 heard, no murmurs appreciated Pulm: clear to auscultation bilaterally, no wheezes, rhonchi or rales Skin: dry, intact, no rashes or lesions    Assessment/Plan: See problem based a/p  Cough Continues to have cough, has been ongoing for about 4 weeks. -No symptoms of acute infection, some history of possible exercise-induced  asthma, no history consistent with COPD-possibly a combination of possible post viral versus allergic.  Patient is currently taking Protonix 20 mg twice daily so less likely a symptom of reflux.  Will treat for postnasal drip if no improvement have patient follow-up for further workup - fluticasone (FLONASE) 50 MCG/ACT nasal spray; Place 2 sprays into both nostrils daily.  Dispense: 16 g; Refill: 6 - cetirizine (ZYRTEC) 10 MG tablet; Take 1 tablet (10 mg total) by mouth daily.  Dispense: 30 tablet; Refill: 5 - Two week follow up if no improvement

## 2017-11-30 ENCOUNTER — Encounter: Payer: Self-pay | Admitting: Internal Medicine

## 2017-11-30 DIAGNOSIS — R052 Subacute cough: Secondary | ICD-10-CM | POA: Insufficient documentation

## 2017-11-30 DIAGNOSIS — R059 Cough, unspecified: Secondary | ICD-10-CM | POA: Insufficient documentation

## 2017-11-30 DIAGNOSIS — R05 Cough: Secondary | ICD-10-CM | POA: Insufficient documentation

## 2017-11-30 NOTE — Assessment & Plan Note (Signed)
Continues to have cough, has been ongoing for about 4 weeks. -No symptoms of acute infection, some history of possible exercise-induced asthma, no history consistent with COPD-possibly a combination of possible post viral versus allergic.  Patient is currently taking Protonix 20 mg twice daily so less likely a symptom of reflux.  Will treat for postnasal drip if no improvement have patient follow-up for further workup - fluticasone (FLONASE) 50 MCG/ACT nasal spray; Place 2 sprays into both nostrils daily.  Dispense: 16 g; Refill: 6 - cetirizine (ZYRTEC) 10 MG tablet; Take 1 tablet (10 mg total) by mouth daily.  Dispense: 30 tablet; Refill: 5 - Two week follow up if no improvement

## 2017-12-16 ENCOUNTER — Other Ambulatory Visit: Payer: Self-pay

## 2017-12-16 ENCOUNTER — Ambulatory Visit (INDEPENDENT_AMBULATORY_CARE_PROVIDER_SITE_OTHER): Payer: Self-pay | Admitting: Family Medicine

## 2017-12-16 ENCOUNTER — Encounter: Payer: Self-pay | Admitting: Family Medicine

## 2017-12-16 VITALS — BP 110/70 | HR 117 | Temp 98.3°F | Ht 62.0 in | Wt 203.0 lb

## 2017-12-16 DIAGNOSIS — B079 Viral wart, unspecified: Secondary | ICD-10-CM | POA: Insufficient documentation

## 2017-12-16 DIAGNOSIS — M5416 Radiculopathy, lumbar region: Secondary | ICD-10-CM

## 2017-12-16 DIAGNOSIS — L739 Follicular disorder, unspecified: Secondary | ICD-10-CM | POA: Insufficient documentation

## 2017-12-16 MED ORDER — SALICYLIC ACID 17.6 % EX LIQD: 1.0000 "application " | Freq: Every day | CUTANEOUS | 0 refills | Status: DC

## 2017-12-16 NOTE — Patient Instructions (Addendum)
It was great seeing you again. I am glad that you have been getting some relief from the aspercreme and the salonpas ointments. Unfortunately there are really not other medications that I can recommend within your price range. I think the best option will be to be re-evaluated by your neurosurgeon. I will place that referral today.  Regarding your back skin lesion. I believe it is something called verruca vulgaris, which is a skin lesion caused by a virus. Topical salicylic acid is the best treatment for this. If it fails to go away in 6 weeks, we can consider freezing this off.  Regarding your breast lesion, I believe this is healing well. It is likely a skin infection of a hair follicle. Please let me know if this gets worse and we can consider antibiotics. Otherwise continue warm compresses and the excellent wound care you have bee providing.

## 2017-12-17 NOTE — Progress Notes (Signed)
   HPI 52 year old who presents with chronic lower back pain. Last saw patient in late march for similar issue. Patient currently takes flexeril, duloxetine, nsaids. At that visit tried to start on lidocaine jelly. It was too expensive. At that time recommended cheaper over the counter alternatives such as solonpas and aspercreme. Has been getting good relief from these treaments but laments that they wear off. She has tried a number of non-pharm treatments such as pt, water therapy, etc and has not gotten much relief. She has also had spinal injections in the past and does not want to pursue that route. Patient is very interested in getting a referral back to neurosurgery for another opinion. She has been told that the spinal hardware from an L5-S1 fusion "Has a screw loose". Upon review it looks like there is non-union at that site with bone resorption around a screw at S1.  Patient also has noticed a skin growth on her left upper back. There is attached media under the media tag. First noticed it a few weeks ago, has not noticed it getting any worse. It is not painful.  Patient has also notice a small boil in her right breast. First noticed it about a week and a half ago. Has been using warm compresses with good resultant drainage.   CC: lower back pain   ROS:   Review of Systems See HPI for ROS.   CC, SH/smoking status, and VS noted  Objective: BP 110/70   Pulse (!) 117   Temp 98.3 F (36.8 C) (Oral)   Ht 5\' 2"  (1.575 m)   Wt 92.1 kg (203 lb)   SpO2 94%   BMI 37.13 kg/m  Gen: alert, oriented. Well appearing caucasian female. No acute distress HEENT: bilateral TM intact, mmm, orophaynx without erythema Heart: rrr, no m/r/g, palpable peripheral pulses Lungs: lungs clear to ausculation bilaterally Abdomen: soft, ntnd. Bowel sounds + Neuro: no focal neuro deficits, 5/5 strength BUE, BLE. Cn 2-12 intact. Back: circumscribed growth consistent with vurucca vulgaris Right breast:  healing small area of erythema, no signs or symptoms concerning for cellulitis   Assessment and plan:  Lumbar radiculopathy Unfortunately have exhausted most treatments for her chronic back pain. Explained that nature of spinal fusions and that eventually most people will develop back pain above and below fusion site. Patient is interested in seeing neurosurgery again for re-eval. Will place referral. Original surgery performed by dr. Cyndy Freeze, but she is interested in seeing another provider. Recommended continuing current therapies as these do seem to be working some. - continue current treatment - will place referral to neurosurgeon in thn  Folliculitis Likely folliculitis of the breast. Is healing well with warm compresses. Gave return precautions. Asked patient to continue warm compresses. No indication for topical abx at this time.  Viral warts Lesion consistent with vurruca vulgaris. Gave script for salicylic acid. Asked patient to try this for 6 weeks and if it has not resolved, can consider freezing off in derm clinic.   No orders of the defined types were placed in this encounter.   Meds ordered this encounter  Medications  . Salicylic Acid 54.6 % LIQD    Sig: Apply 1 application topically daily.    Dispense:  9.3 mL    Refill:  0     Guadalupe Dawn MD PGY-1 Family Medicine Resident  12/17/2017 1:52 PM

## 2017-12-17 NOTE — Assessment & Plan Note (Signed)
Lesion consistent with vurruca vulgaris. Gave script for salicylic acid. Asked patient to try this for 6 weeks and if it has not resolved, can consider freezing off in derm clinic.

## 2017-12-17 NOTE — Assessment & Plan Note (Addendum)
Likely folliculitis of the breast. Is healing well with warm compresses. Gave return precautions. Asked patient to continue warm compresses. No indication for topical abx at this time.

## 2017-12-17 NOTE — Assessment & Plan Note (Signed)
Unfortunately have exhausted most treatments for her chronic back pain. Explained that nature of spinal fusions and that eventually most people will develop back pain above and below fusion site. Patient is interested in seeing neurosurgery again for re-eval. Will place referral. Original surgery performed by dr. Cyndy Freeze, but she is interested in seeing another provider. Recommended continuing current therapies as these do seem to be working some. - continue current treatment - will place referral to neurosurgeon in thn

## 2017-12-20 ENCOUNTER — Other Ambulatory Visit (HOSPITAL_COMMUNITY): Payer: Self-pay | Admitting: Psychiatry

## 2017-12-20 ENCOUNTER — Telehealth (HOSPITAL_COMMUNITY): Payer: Self-pay | Admitting: Psychiatry

## 2017-12-20 MED ORDER — DIAZEPAM 2 MG PO TABS: 2.0000 mg | ORAL_TABLET | Freq: Every day | ORAL | 0 refills | Status: DC

## 2017-12-20 NOTE — Telephone Encounter (Signed)
Received request from pharmacy for diazepam refill. Please contact the patient if she needs this refill.

## 2017-12-20 NOTE — Telephone Encounter (Signed)
Dr Alvino Chapel with patient & she is still taking this medication & states that she would need a refill

## 2017-12-20 NOTE — Telephone Encounter (Signed)
Done.   I have utilized the [place] Controlled Substances Reporting System (PMP AWARxE) to confirm adherence regarding the patient's medication. My review reveals appropriate prescription fills.

## 2017-12-21 ENCOUNTER — Telehealth: Payer: Self-pay

## 2017-12-21 MED ORDER — AMOXICILLIN-POT CLAVULANATE 875-125 MG PO TABS: 1.0000 | ORAL_TABLET | Freq: Two times a day (BID) | ORAL | 0 refills | Status: AC

## 2017-12-21 NOTE — Telephone Encounter (Signed)
Have sent in script for augmentin, 7 day course 2 times per day. Please let patient know this has been sent to her pharmacy on file, [facility] in [place], [place].  Guadalupe Dawn MD PGY-1 [facility] Medicine Resident

## 2017-12-21 NOTE — Telephone Encounter (Signed)
Patient left message that boil has not gone away and she was to call for an antibiotic if this happened. States Augmentin works well for her if it is appropriate and z-pack does not work well.  Call back is [phone].  Danley Danker, RN [facility] RN)

## 2017-12-22 NOTE — Telephone Encounter (Signed)
Pt informed. Deseree Blount, CMA  

## 2017-12-23 ENCOUNTER — Ambulatory Visit (INDEPENDENT_AMBULATORY_CARE_PROVIDER_SITE_OTHER): Payer: Medicaid Other | Admitting: Orthopaedic Surgery

## 2017-12-23 ENCOUNTER — Encounter (INDEPENDENT_AMBULATORY_CARE_PROVIDER_SITE_OTHER): Payer: Self-pay | Admitting: Orthopaedic Surgery

## 2017-12-23 ENCOUNTER — Ambulatory Visit (INDEPENDENT_AMBULATORY_CARE_PROVIDER_SITE_OTHER): Payer: Self-pay

## 2017-12-23 VITALS — BP 112/78 | HR 117 | Ht 62.0 in | Wt 200.0 lb

## 2017-12-23 DIAGNOSIS — M545 Low back pain: Secondary | ICD-10-CM

## 2017-12-23 DIAGNOSIS — G8929 Other chronic pain: Secondary | ICD-10-CM

## 2017-12-23 NOTE — Progress Notes (Signed)
Office Visit Note   Patient: [facility]           Date of Birth: 11/18/1965           MRN: [id number] Visit Date: 12/23/2017              Requested by: Guadalupe Dawn, MD [phone], [place] [address] PCP: Guadalupe Dawn, MD   Assessment & Plan: Visit Diagnoses:  1. Chronic bilateral low back pain, with sciatica presence unspecified     Plan: Back pain post lumbar fusion.  CT scan consistent with pseudoarthrosis.  We discussed reoperation may not improve her current pain symptoms.repeat surgery may not be successful in achieving fusion. She does not move on flexion extension xrays stress views.   Follow-Up Instructions: No follow-ups on file.   Orders:  Orders Placed This Encounter  Procedures  . XR Lumbar Spine 2-3 Views   No orders of the defined types were placed in this encounter.     Procedures: No procedures performed   Clinical Data: No additional findings.   Subjective: Chief Complaint  Patient presents with  . Lower Back - Pain    HPI 52 year old female returns problems with low back pain post L5-S1 fusion by Dr. Cyndy Freeze on 12/21/2016.  Patient has not worked since her surgery she states she has ongoing back pain.  She states her pain is 7 or 8 out of 10.  She has numbness in the anterolateral left thigh numbness laterally in the right lateral calf.  She states her pain is worse when she standing.  She is on Neurontin 800 mg 3 times daily, Flexeril occasionally taking 1 extra Flexeril tablet if she has increased symptoms.  She takes Valium 5 mg and Cymbalta 120 mg.  She denies associated bowel or bladder symptoms.  CT scan 10/25/2017 showed some loosening around the sacral screws, good decompression of the surgical site suggestive of pseudoarthrosis.  Patient states she is applied for disability.  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  Patient today requested repeat x-rays of her lumbar spine.  Review of Systems  review of systems updated unchanged from 09/29/2017.  Of note is a history of anxiety disorder adjustment disorder with depressed mood GERD, hypothyroidism..   Objective: Vital Signs: BP 112/78   Pulse (!) 117   Ht 5\' 2"  (1.575 m)   Wt 200 lb (90.7 kg)   BMI 36.58 kg/m   Physical Exam  Constitutional: She is oriented to person, place, and time. She appears well-developed.  HENT:  Head: Normocephalic.  Right Ear: External ear normal.  Left Ear: External ear normal.  Eyes: Pupils are equal, round, and reactive to light.  Neck: No tracheal deviation present. No thyromegaly present.  Cardiovascular: Normal rate.  Pulmonary/Chest: Effort normal.  Abdominal: Soft.  Neurological: She is alert and oriented to person, place, and time.  Skin: Skin is warm and dry.  Psychiatric: She has a normal mood and affect. Her behavior is normal.    Ortho Exam patient has increased BMI.  Quads hamstrings are strong normal heel toe gait.  Knees reach full extension no calf quad atrophy.  No rash over exposed skin.  No synovitis of the wrist or fingers.  Specialty Comments:  No specialty comments available.  Imaging: No results found.   PMFS History: Patient Active Problem List   Diagnosis Date Noted  . Viral warts 12/16/2017  . Folliculitis 18/56/3149  . Cough 11/30/2017  .  Acute bronchitis 11/21/2017  . Encounter to establish care 11/21/2017  . Lumbar back pain 10/22/2017  . Adjustment disorder with depressed mood 05/24/2017  . Hoarseness 04/21/2017  . Anxiety disorder 03/16/2017  . Lumbar radiculopathy 12/21/2016  . Neck pain 12/20/2015  . Hypothyroidism    Past Medical History:  Diagnosis Date  . Arthritis   . Depression   . DJD (degenerative joint disease)   . Fibromyalgia   . GERD (gastroesophageal reflux disease)   . Hypothyroidism   . PONV (postoperative nausea and vomiting)     Family History  Problem Relation Age of Onset  . Thyroid disease Sister   . Thyroid disease  Brother     Past Surgical History:  Procedure Laterality Date  . APPLICATION OF ROBOTIC ASSISTANCE FOR SPINAL PROCEDURE N/A 12/21/2016   Procedure: APPLICATION OF ROBOTIC ASSISTANCE FOR SPINAL PROCEDURE;  Surgeon: Kevan Ny Ditty, MD;  Location: [facility];  Service: Neurosurgery;  Laterality: N/A;  . BACK SURGERY     L5-S1  . FOOT SURGERY    . HAND SURGERY     from [facility]  . KNEE ARTHROSCOPY     x2   Social History   Occupational History  . Not on file  Tobacco Use  . Smoking status: Former Research scientist (life sciences)  . Smokeless tobacco: Never Used  . Tobacco comment: 03-16-2017 per pt no   Substance and Sexual Activity  . Alcohol use: Yes    Comment: weekly, 03-16-2017 per pt 2-4 beer per week  . Drug use: No    Comment: 03-16-2017 per pt when she was 16 year  . Sexual activity: Not on file
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
## 2017-12-30 ENCOUNTER — Ambulatory Visit (INDEPENDENT_AMBULATORY_CARE_PROVIDER_SITE_OTHER): Payer: Self-pay | Admitting: Psychiatry

## 2017-12-30 ENCOUNTER — Encounter (HOSPITAL_COMMUNITY): Payer: Self-pay | Admitting: Psychiatry

## 2017-12-30 VITALS — BP 112/81 | HR 82 | Ht 62.0 in | Wt 201.0 lb

## 2017-12-30 DIAGNOSIS — F419 Anxiety disorder, unspecified: Secondary | ICD-10-CM

## 2017-12-30 DIAGNOSIS — F4321 Adjustment disorder with depressed mood: Secondary | ICD-10-CM

## 2017-12-30 DIAGNOSIS — G4700 Insomnia, unspecified: Secondary | ICD-10-CM

## 2017-12-30 DIAGNOSIS — Z560 Unemployment, unspecified: Secondary | ICD-10-CM

## 2017-12-30 DIAGNOSIS — R450 Nervousness: Secondary | ICD-10-CM

## 2017-12-30 DIAGNOSIS — Z87891 Personal history of nicotine dependence: Secondary | ICD-10-CM

## 2017-12-30 MED ORDER — DULOXETINE HCL 60 MG PO CPEP: 120.0000 mg | ORAL_CAPSULE | Freq: Every day | ORAL | 0 refills | Status: DC

## 2017-12-30 MED ORDER — DIAZEPAM 2 MG PO TABS: 2.0000 mg | ORAL_TABLET | Freq: Every day | ORAL | 2 refills | Status: DC

## 2017-12-30 NOTE — Patient Instructions (Addendum)
1. Continue duloxetine 120 mg daily  2. Continue valium 2 mg daily as needed for anxiety- she reports she has enough meds 3.Return to clinicin three months for 15 mins 4. Make follow up with Arrie Eastern, LCSW

## 2018-01-17 ENCOUNTER — Encounter: Payer: Self-pay | Admitting: Endocrinology

## 2018-01-17 ENCOUNTER — Ambulatory Visit (INDEPENDENT_AMBULATORY_CARE_PROVIDER_SITE_OTHER): Payer: Self-pay | Admitting: Endocrinology

## 2018-01-17 VITALS — BP 110/80 | HR 107 | Wt 204.2 lb

## 2018-01-17 DIAGNOSIS — R490 Dysphonia: Secondary | ICD-10-CM

## 2018-01-17 DIAGNOSIS — E039 Hypothyroidism, unspecified: Secondary | ICD-10-CM

## 2018-01-17 LAB — T4, FREE: Free T4: 0.87 ng/dL (ref 0.60–1.60)

## 2018-01-17 LAB — TSH: TSH: 1.12 u[IU]/mL (ref 0.35–4.50)

## 2018-01-17 NOTE — Patient Instructions (Addendum)
blood tests are requested for you today.  We'll let you know about the results. Let's recheck the ultrasound.  you will receive a phone call, about a day and time for an appointment. Please see a specialist, for the neck symptoms.  you will receive a phone call, about a day and time for an appointment. Please come back for a follow-up appointment in 1 year.
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
## 2018-01-20 ENCOUNTER — Telehealth: Payer: Self-pay | Admitting: Endocrinology

## 2018-01-20 NOTE — Telephone Encounter (Signed)
I called and notified patient of results. Also let her know ENT was sent,

## 2018-01-20 NOTE — Telephone Encounter (Signed)
Please call patient at ph# [phone] re: Lab Results and a referral to a [facility] ENT

## 2018-01-25 ENCOUNTER — Telehealth (INDEPENDENT_AMBULATORY_CARE_PROVIDER_SITE_OTHER): Payer: Self-pay | Admitting: Orthopaedic Surgery

## 2018-01-25 NOTE — Telephone Encounter (Signed)
Patient called asked if Dr Lorin Mercy was going to refer her to another Provider all though patient said she would like to stay with Dr Lorin Mercy. The number to contact patient is [phone]

## 2018-01-25 NOTE — Telephone Encounter (Signed)
Please advise 

## 2018-01-26 NOTE — Telephone Encounter (Signed)
Schedule ROV to discuss revision surgery. thanks

## 2018-01-26 NOTE — Telephone Encounter (Signed)
Could you please call and schedule follow up appointment with Dr. Lorin Mercy. He will be glad to see her to discuss revision surgery.

## 2018-01-27 NOTE — Telephone Encounter (Signed)
Spoke with patient appt 02/02/18 @ 10:30am

## 2018-01-31 ENCOUNTER — Telehealth: Payer: Self-pay | Admitting: Endocrinology

## 2018-02-02 ENCOUNTER — Ambulatory Visit (INDEPENDENT_AMBULATORY_CARE_PROVIDER_SITE_OTHER): Payer: Self-pay | Admitting: Orthopaedic Surgery

## 2018-02-02 ENCOUNTER — Encounter (INDEPENDENT_AMBULATORY_CARE_PROVIDER_SITE_OTHER): Payer: Self-pay | Admitting: Orthopaedic Surgery

## 2018-02-02 VITALS — BP 135/93 | HR 112 | Ht 63.0 in | Wt 200.0 lb

## 2018-02-02 DIAGNOSIS — Z981 Arthrodesis status: Secondary | ICD-10-CM

## 2018-02-07 ENCOUNTER — Encounter (INDEPENDENT_AMBULATORY_CARE_PROVIDER_SITE_OTHER): Payer: Self-pay | Admitting: Orthopaedic Surgery

## 2018-02-07 DIAGNOSIS — Z981 Arthrodesis status: Secondary | ICD-10-CM | POA: Insufficient documentation

## 2018-02-07 NOTE — Progress Notes (Signed)
Office Visit Note   Patient: Laura Burnett           Date of Birth: May 28, 1966           MRN: [id number] Visit Date: 02/02/2018              Requested by: Guadalupe Dawn, MD [phone], [place] [address] PCP: Guadalupe Dawn, MD   Assessment & Plan: Visit Diagnoses:  1. S/P lumbar fusion   2.     L5-S1 pseudarthrosis with loose S1 pedicle screws  Plan: Patient has symptomatic pseudoarthrosis L5-S1 with loose S1 pedicle screws more symptoms on the left than right and failed conservative treatment.  Her pain limits her ability to resume work activities.  Patient is interested in operative treatment and we discussed results with revision surgery for pseudoarthrosis with 20 to 30% of patient still having back pain and potential for leg pain.  Patient would require bilateral lateral fusion with pedicle screws removal and larger revision screw placement at S1 both right and left.  Left S1 screw is slightly lateral position but is in satisfactory position safe zone and is in bone.  Local bone and bone substitute would be required since she is already had previous local bone used for interbody cages which are in satisfactory position is no additional room in the interbody space for an additional cage placement.  Patient previously had BMP used at the time of her repeat surgery at L5-S1 with her fusion procedure done 12/21/2016.  She has room for crossbar linkage placement likely put  between the rods after pedicle screw revision and new rod placement.  Patient has Alphatec pedicle instrumentation from her previous procedure.  Plan Depuy Polaris pedicle system revision.  Risks of surgery discussed with patient.  Potential for pseudoarthrosis and nonhealing even after revision surgery was performed.  Potential for disc degeneration at adjacent levels with time which is been carefully explained.  Questions were elicited and answered patient understands and requests we proceed.  Suspect to night  stay in the hospital use of a brace for 2 months to 3 months postoperatively.  Follow-Up Instructions: No follow-ups on file.   Orders:  No orders of the defined types were placed in this encounter.  No orders of the defined types were placed in this encounter.     Procedures: No procedures performed   Clinical Data: No additional findings.   Subjective: Chief Complaint  Patient presents with  . Lower Back - Pain    HPI 52 year old female L5-S1 fusion by Dr. Cyndy Freeze on 12/21/2016 with continued significant pain 8 out of 10 and loose pedicle screws at S1 and pseudoarthrosis by CT scan 10/05/2017.  No compression at other levels of the lumbar spine.  She has more symptoms on the left than right has difficulty increased pain with activities of daily living.  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  Pain with turning twisting.  She cannot lay on her left side.  She is taken anti-inflammatories, gabapentin, Flexeril, Cymbalta.  Dr. Cyndy Freeze is no longer practicing in [place].  Patient used to work at [employer] and was a Journalist, newspaper for many years.  She was terminated due to inability to do her job and is applied for disability has an Forensic psychologist.  She is had EMGs nerve conduction velocities that showed some changes consistent with carpal tunnel syndrome and remote changes of L5 radiculopathy chronic.  No bowel or bladder symptoms.  Patient's been followed for 6 months with persistent symptoms.  Review of Systems previous right knee arthroscopy 2006, bilateral foot surgery 2009.  Hand surgery tendon repairs.  Hysterectomy 2003.  Positive for acid reflux, anxiety, fibromyalgia patient self-reported.  History of depression, history of goiter.  Hypercholesterolemia.  Adjustment disorder with depressed mood. Mild cervical disc degeneration C4-5, C5-6, C6-7 without compression.  14 point review of systems otherwise negative as it pertains HPI.  Objective: Vital Signs:  BP (!) 135/93   Pulse (!) 112   Ht 5\' 3"  (1.6 m)   Wt 200 lb (90.7 kg)   BMI 35.43 kg/m   Physical Exam  Constitutional: She is oriented to person, place, and time. She appears well-developed.  HENT:  Head: Normocephalic.  Right Ear: External ear normal.  Left Ear: External ear normal.  Eyes: Pupils are equal, round, and reactive to light.  Neck: No tracheal deviation present. No thyromegaly present.  Cardiovascular: Normal rate.  Pulmonary/Chest: Effort normal.  Abdominal: Soft.  Neurological: She is alert and oriented to person, place, and time.  Skin: Skin is warm and dry.  Psychiatric: She has a normal mood and affect. Her behavior is normal.    Ortho Exam patient is able to stand she can ambulate heel and toe walk.  No gastrocsoleus peroneal or anterior tib weakness.  She has some lateral calf lateral foot numbness.  Lumbar incision is well-healed.  Negative straight leg raising 90 degrees.  Forward flexion lumbar spine knees reach mid tibial level with back pain buttocks pain.  She has pain with extension pain with lateral bending.  Lateral bending limited to 40% normal.  Pain with lumbar rotation limited less than 50% by pain.  Specialty Comments:  No specialty comments available.  Imaging: CLINICAL DATA:  Persistent low back pain. Numbness and burning pain in both legs.  EXAM: CT LUMBAR SPINE WITHOUT CONTRAST  TECHNIQUE: Multidetector CT imaging of the lumbar spine was performed without intravenous contrast administration. Multiplanar CT image reconstructions were also generated.  COMPARISON:  Radiographs dated 07/07/2017 and CT scan dated 12/04/2016 and MRI dated 07/26/2016  FINDINGS: Segmentation: 5 lumbar type vertebrae.  Alignment: Normal.  Vertebrae: Previous posterior and interbody fusion at L5-S1.  Paraspinal and other soft tissues: Minimal aortic atherosclerosis. Otherwise negative.  Disc levels: T12-L1: Normal.  L1-2: Normal.  L2-3:  Normal.  L3-4: Minimal disc bulge into the left neural foramen without neural impingement, unchanged. Otherwise negative.  L4-5: Small disc bulges into both neural foramina, right greater than left with no neural impingement, unchanged.  L5-S1: There is bone resorption around the pedicle screws in S1. Posterior decompression at L5-S1. Soft tissue surrounds the thecal sac and nerve root sleeves consistent with postoperative scarring. Interbody fusion device appears in good position. Widely patent neural foramina. No definitive bony bridging of the disc space.  IMPRESSION: 1. 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 2. Postoperative scarring around the thecal sac at L5-S1. Excellent posterior decompression at L5-S1. 3. No significant change in the remainder of the lumbar spine since the prior CT scan. No focal neural impingement.   Electronically Signed   By: Lorriane Shire M.D.   On: 10/05/2017 10:29    PMFS History: Patient Active Problem List   Diagnosis Date Noted  . Viral warts 12/16/2017  . Folliculitis 53/61/4431  . Cough 11/30/2017  . Acute bronchitis 11/21/2017  . Encounter to establish care 11/21/2017  . Lumbar back pain 10/22/2017  .  Adjustment disorder with depressed mood 05/24/2017  . Hoarseness 04/21/2017  . Anxiety disorder 03/16/2017  . Lumbar radiculopathy 12/21/2016  . Neck pain 12/20/2015  . Hypothyroidism    Past Medical History:  Diagnosis Date  . Arthritis   . Depression   . DJD (degenerative joint disease)   . Fibromyalgia   . GERD (gastroesophageal reflux disease)   . Hypothyroidism   . PONV (postoperative nausea and vomiting)     Family History  Problem Relation Age of Onset  . Thyroid disease Sister   . Thyroid disease Brother     Past Surgical History:  Procedure Laterality Date  . APPLICATION OF ROBOTIC ASSISTANCE FOR SPINAL PROCEDURE N/A 12/21/2016   Procedure:  APPLICATION OF ROBOTIC ASSISTANCE FOR SPINAL PROCEDURE;  Surgeon: Kevan Ny Ditty, MD;  Location: [place];  Service: Neurosurgery;  Laterality: N/A;  . BACK SURGERY     L5-S1  . FOOT SURGERY    . HAND SURGERY     from [facility]  . KNEE ARTHROSCOPY     x2   Social History   Occupational History  . Not on file  Tobacco Use  . Smoking status: Former Research scientist (life sciences)  . Smokeless tobacco: Never Used  . Tobacco comment: 03-16-2017 per pt no   Substance and Sexual Activity  . Alcohol use: Yes    Comment: weekly, 03-16-2017 per pt 2-4 beer per week  . Drug use: No    Comment: 03-16-2017 per pt when she was 16 year  . Sexual activity: Not on file

## 2018-02-09 ENCOUNTER — Ambulatory Visit (HOSPITAL_COMMUNITY): Payer: Medicaid Other | Admitting: Licensed Clinical Social Worker

## 2018-02-10 ENCOUNTER — Other Ambulatory Visit: Payer: Self-pay

## 2018-02-10 MED ORDER — CYCLOBENZAPRINE HCL 10 MG PO TABS: 10.0000 mg | ORAL_TABLET | Freq: Three times a day (TID) | ORAL | 1 refills | Status: DC | PRN

## 2018-02-11 ENCOUNTER — Telehealth: Payer: Self-pay

## 2018-02-11 MED ORDER — CYCLOBENZAPRINE HCL 10 MG PO TABS: 10.0000 mg | ORAL_TABLET | Freq: Three times a day (TID) | ORAL | 1 refills | Status: DC | PRN

## 2018-02-11 MED ORDER — GABAPENTIN 400 MG PO CAPS: 800.0000 mg | ORAL_CAPSULE | Freq: Three times a day (TID) | ORAL | 0 refills | Status: DC

## 2018-02-11 MED ORDER — GABAPENTIN 800 MG PO TABS: 800.0000 mg | ORAL_TABLET | Freq: Three times a day (TID) | ORAL | 3 refills | Status: DC

## 2018-02-11 NOTE — Addendum Note (Signed)
Addended by: Pauletta Browns on: 02/11/2018 08:27 PM   Modules accepted: Orders

## 2018-02-11 NOTE — Telephone Encounter (Signed)
Fax from pharmacy received that patient would prefer Gabapentin 800 mg to be sent.  Danley Danker, RN [facility] RN)

## 2018-02-11 NOTE — Telephone Encounter (Signed)
Please resend prescriptions.  Flexeril was not set to e-prescribe. Gabapentin - patient states she takes 800 mg TID. Quantity was set to 30.  Call back is [phone]  Danley Danker, RN [facility])

## 2018-02-11 NOTE — Telephone Encounter (Signed)
Received call on nurse line from patient requesting a refill on the pended medications. Please advise.

## 2018-02-11 NOTE — Telephone Encounter (Signed)
Changed tablet to 800mg  instead of 2 400mg . Changed flexeril to normal. Should be able to pick up from pharmacy now.  Guadalupe Dawn MD PGY-1 Family Medicine Resident

## 2018-02-11 NOTE — Telephone Encounter (Signed)
Pt called requesting refills on her flexeril and gabapentin be sent to [facility] in [place]. She said her gabapentin was filled but filled wrong. She said they are supposed to be 800mg  and not 400mg  and she is supposed to take 3 a day. It looks like the flexeril may have been printed instead of sent electronically. Please advise

## 2018-02-11 NOTE — Addendum Note (Signed)
Addended by: Esau Grew on: 02/11/2018 04:57 PM   Modules accepted: Orders

## 2018-02-14 NOTE — Telephone Encounter (Signed)
Pt informed, but said the 400 mg is cheaper and she could use her discount card with that. Just FYI for the future. Deseree Kennon Holter, CMA

## 2018-02-15 ENCOUNTER — Other Ambulatory Visit: Payer: Self-pay

## 2018-02-15 NOTE — Telephone Encounter (Signed)
I just refilled her flexeril on 6/14 and sent it to the [facility] in [place] per her request. I was E-prescribed and her pharmacy confirmed receipt of the script at 8:14PM on that date. Is there some reason she has been unable to get this medication? Please let the patient know this has been sent in. I will not refill this as it has only been 4 days and I sent a supply that should have lasted for 3-4 weeks if she is somehow already out.  Guadalupe Dawn MD PGY-1 Family Medicine Resident

## 2018-02-17 NOTE — Telephone Encounter (Signed)
Previous message was seen about meds and pt was already informed.  Kennon Holter, CMA

## 2018-02-22 ENCOUNTER — Ambulatory Visit (INDEPENDENT_AMBULATORY_CARE_PROVIDER_SITE_OTHER): Payer: Self-pay | Admitting: Licensed Clinical Social Worker

## 2018-02-22 DIAGNOSIS — F0631 Mood disorder due to known physiological condition with depressive features: Secondary | ICD-10-CM

## 2018-02-22 DIAGNOSIS — F419 Anxiety disorder, unspecified: Secondary | ICD-10-CM

## 2018-02-23 ENCOUNTER — Encounter (HOSPITAL_COMMUNITY): Payer: Self-pay | Admitting: Licensed Clinical Social Worker

## 2018-02-23 NOTE — Progress Notes (Signed)
   THERAPIST PROGRESS NOTE  Session Time: 1:00 pm-1:45 pm  Participation Level: Active  Behavioral Response: CasualAlertAnxious  Type of Therapy: Individual Therapy  Treatment Goals addressed: Coping  Interventions: CBT and Solution Focused  Summary: Laura Burnett is a 52 y.o. female who presents  oriented x5 (person, place, situation, time and object), alert, casually dressed, appropriately groomed, average height, overwieght,  and cooperative for an assessment on a referral from Dr. Modesta Messing and PCP. Patient has a history of medical treatment including hypothyroidism, back surgery, and fibromyalgia. Patient has minimal mental health treatment that includes medication management. Patient denies symptoms of mania. Patient denies suicidal and homicidal ideations. She denies psychosis including auditory and visual hallucinations. Patient denies substance abuse but admits to occasional alcohol use. Patient is at low risk for lethality at this time. Patient has been treated for Anxiety for several years. She is experiencing depressive symptoms that are connected to medical conditions (fibromyalsia, back pain, chronic pain).  Physically: Patient has severe back pain. Patient is scheduled for another back surgery. She is in constant pain. Spiritually/Values: No issues identified.  Relationships: Patient is getting along with her family especially her son. Emotional/Mental/Behavior: Patient is experiencing anxiety. She is worried about financial issues and her physical health. Patient is waiting on her disability to go through. She is overwhelmed. Patient did note that she has developed a hobby of making crafts out of stones.   Patient engaged in session. Patient responded well to interventions. Patient continues to meet criteria for Mood disorder with depressive features due to general medical condition and Anxiety disorder, unspecified type. Patient will continue in outpatient therapy due to being the  least restrictive service to meet her needs. Patient made minimal progress on her goals at this time.    Suicidal/Homicidal: Negativewithout intent/plan  Therapist Response: Therapist reviewed patient's recent thoughts and behaviors. Therapist utilized CBT to address mood. Therapist processed patient's feelings to identify triggers for mood. Therapist discussed ways to manage her mood and physical pain.    Plan: Return again in 3-4 weeks.   Diagnosis: Axis I: Anxiety Disorder NOS and Mood disorder with depressive features due to general medical condition    Axis II: No diagnosis    Glori Bickers, LCSW 02/23/2018

## 2018-03-01 NOTE — Pre-Procedure Instructions (Signed)
Laura Burnett  03/01/2018      [facility] [address] [place] [address] Phone: [phone] Fax: [phone]    Your procedure is scheduled on July 8  Report to [facility] at 1030 A.M.  Call this number if you have problems the morning of surgery:  [phone]   Remember:  Do not eat or drink after midnight.      Take these medicines the morning of surgery with A SIP OF WATER  albuterol (PROVENTIL HFA;VENTOLIN HFA) DULoxetine (CYMBALTA) fluticasone (FLONASE) gabapentin (NEURONTIN)  levothyroxine (SYNTHROID, LEVOTHROID) loratadine (CLARITIN) ranitidine (ZANTAC  7 days prior to surgery STOP taking any Aspirin(unless otherwise instructed by your surgeon), Aleve, Naproxen, Ibuprofen, Motrin, Advil, Goody's, BC's, all herbal medications, fish oil, and all vitamins   Do not wear jewelry, make-up or nail polish.  Do not wear lotions, powders, or perfumes, or deodorant.  Do not shave 48 hours prior to surgery.  Men may shave face and neck.  Do not bring valuables to the hospital.  [facility] is not responsible for any belongings or valuables.  Contacts, dentures or bridgework may not be worn into surgery.  Leave your suitcase in the car.  After surgery it may be brought to your room.  For patients admitted to the hospital, discharge time will be determined by your treatment team.  Patients discharged the day of surgery will not be allowed to drive home.  Special instructions:   [facility]- Preparing For Surgery  Before surgery, you can play an important role. Because skin is not sterile, your skin needs to be as free of germs as possible. You can reduce the number of germs on your skin by washing with CHG (chlorahexidine gluconate) Soap before surgery.  CHG is an antiseptic cleaner which kills germs and bonds with the skin to continue killing germs even after washing.    Oral Hygiene is also important to  reduce your risk of infection.  Remember - BRUSH YOUR TEETH THE MORNING OF SURGERY WITH YOUR REGULAR TOOTHPASTE  Please do not use if you have an allergy to CHG or antibacterial soaps. If your skin becomes reddened/irritated stop using the CHG.  Do not shave (including legs and underarms) for at least 48 hours prior to first CHG shower. It is OK to shave your face.  Please follow these instructions carefully.   1. Shower the NIGHT BEFORE SURGERY and the MORNING OF SURGERY with CHG.   2. If you chose to wash your hair, wash your hair first as usual with your normal shampoo.  3. After you shampoo, rinse your hair and body thoroughly to remove the shampoo.  4. Use CHG as you would any other liquid soap. You can apply CHG directly to the skin and wash gently with a scrungie or a clean washcloth.   5. Apply the CHG Soap to your body ONLY FROM THE NECK DOWN.  Do not use on open wounds or open sores. Avoid contact with your eyes, ears, mouth and genitals (private parts). Wash Face and genitals (private parts)  with your normal soap.  6. Wash thoroughly, paying special attention to the area where your surgery will be performed.  7. Thoroughly rinse your body with warm water from the neck down.  8. DO NOT shower/wash with your normal soap after using and rinsing off the CHG Soap.  9. Pat yourself dry with a CLEAN TOWEL.  10. Wear CLEAN PAJAMAS to bed the night before surgery, wear comfortable clothes the morning of surgery  11. Place CLEAN SHEETS on your bed the night of your first shower and DO NOT SLEEP WITH PETS.    Day of Surgery:  Do not apply any deodorants/lotions.  Please wear clean clothes to the hospital/surgery center.   Remember to brush your teeth WITH YOUR REGULAR TOOTHPASTE.    Please read over the following fact sheets that you were given.

## 2018-03-02 ENCOUNTER — Encounter (HOSPITAL_COMMUNITY): Payer: Self-pay

## 2018-03-02 ENCOUNTER — Encounter (HOSPITAL_COMMUNITY)
Admission: RE | Admit: 2018-03-02 | Discharge: 2018-03-02 | Disposition: A | Discharge status: Home / Self Care | Payer: Medicaid Other | Source: Ambulatory Visit | Attending: Orthopaedic Surgery | Admitting: Orthopaedic Surgery

## 2018-03-02 ENCOUNTER — Ambulatory Visit (INDEPENDENT_AMBULATORY_CARE_PROVIDER_SITE_OTHER): Payer: Self-pay | Admitting: Surgery

## 2018-03-02 ENCOUNTER — Encounter (INDEPENDENT_AMBULATORY_CARE_PROVIDER_SITE_OTHER): Payer: Self-pay | Admitting: Surgery

## 2018-03-02 ENCOUNTER — Other Ambulatory Visit (HOSPITAL_COMMUNITY): Payer: Self-pay

## 2018-03-02 ENCOUNTER — Other Ambulatory Visit: Payer: Self-pay

## 2018-03-02 VITALS — BP 114/64 | HR 100 | Temp 97.7°F | Resp 16 | Ht 63.0 in | Wt 200.0 lb

## 2018-03-02 DIAGNOSIS — M5417 Radiculopathy, lumbosacral region: Secondary | ICD-10-CM

## 2018-03-02 DIAGNOSIS — Z01812 Encounter for preprocedural laboratory examination: Secondary | ICD-10-CM | POA: Insufficient documentation

## 2018-03-02 HISTORY — DX: Constipation, unspecified: K59.00

## 2018-03-02 HISTORY — DX: Headache: R51

## 2018-03-02 HISTORY — DX: Anxiety disorder, unspecified: F41.9

## 2018-03-02 HISTORY — DX: Headache, unspecified: R51.9

## 2018-03-02 HISTORY — DX: Dyspnea, unspecified: R06.00

## 2018-03-02 HISTORY — DX: Presence of spectacles and contact lenses: Z97.3

## 2018-03-02 HISTORY — DX: Obesity, unspecified: E66.9

## 2018-03-02 HISTORY — DX: Unspecified asthma, uncomplicated: J45.909

## 2018-03-02 HISTORY — DX: Bronchitis, not specified as acute or chronic: J40

## 2018-03-02 LAB — CBC
HEMATOCRIT: 45 % (ref 36.0–46.0)
HEMOGLOBIN: 14.3 g/dL (ref 12.0–15.0)
MCH: 29.1 pg (ref 26.0–34.0)
MCHC: 31.8 g/dL (ref 30.0–36.0)
MCV: 91.5 fL (ref 78.0–100.0)
Platelets: 344 10*3/uL (ref 150–400)
RBC: 4.92 MIL/uL (ref 3.87–5.11)
RDW: 14.1 % (ref 11.5–15.5)
WBC: 7 10*3/uL (ref 4.0–10.5)

## 2018-03-02 LAB — COMPREHENSIVE METABOLIC PANEL
ALBUMIN: 3.7 g/dL (ref 3.5–5.0)
ALK PHOS: 155 U/L — AB (ref 38–126)
ALT: 30 U/L (ref 0–44)
AST: 32 U/L (ref 15–41)
Anion gap: 11 (ref 5–15)
BILIRUBIN TOTAL: 0.6 mg/dL (ref 0.3–1.2)
BUN: 6 mg/dL (ref 6–20)
CALCIUM: 9.3 mg/dL (ref 8.9–10.3)
CO2: 24 mmol/L (ref 22–32)
CREATININE: 0.99 mg/dL (ref 0.44–1.00)
Chloride: 107 mmol/L (ref 98–111)
GFR calc Af Amer: >60 mL/min (ref >60)
GFR calc non Af Amer: >60 mL/min (ref >60)
GLUCOSE: 99 mg/dL (ref 70–99)
Potassium: 3.9 mmol/L (ref 3.5–5.1)
Sodium: 142 mmol/L (ref 135–145)
TOTAL PROTEIN: 7.2 g/dL (ref 6.5–8.1)

## 2018-03-02 LAB — URINALYSIS, ROUTINE W REFLEX MICROSCOPIC
BACTERIA UA: NONE SEEN
Bilirubin Urine: NEGATIVE
Glucose, UA: NEGATIVE mg/dL
Hgb urine dipstick: NEGATIVE
Ketones, ur: NEGATIVE mg/dL
Nitrite: NEGATIVE
PH: 5 (ref 5.0–8.0)
Protein, ur: NEGATIVE mg/dL
SPECIFIC GRAVITY, URINE: 1.015 (ref 1.005–1.030)

## 2018-03-02 LAB — TYPE AND SCREEN
ABO/RH(D): O NEG
Antibody Screen: NEGATIVE

## 2018-03-02 LAB — SURGICAL PCR SCREEN
MRSA, PCR: NEGATIVE
Staphylococcus aureus: NEGATIVE

## 2018-03-02 NOTE — Progress Notes (Signed)
52 year old white female with history of low back pain and persistent left lower extreme radiculopathy comes in for preoperative history and physical.  She is status post L5-S1 fusion.  Today full H&P performed and copy placed in patient's chart.  We received preop medical and cardiac clearances.

## 2018-03-02 NOTE — H&P (Addendum)
Laura Burnett is an 52 y.o. female.   Chief Complaint: Back pain and lower extremity radiculopathy HPI: Patient with history of L5-S1 failed hardware after fusion by Dr. Suezanne Jacquet Ditty with the above complaint comes in for preoperative evaluation.  Progressively worsening symptoms.  Failed conservative treatment.  Past Medical History:  Diagnosis Date  . Arthritis   . Depression   . DJD (degenerative joint disease)   . Fibromyalgia   . GERD (gastroesophageal reflux disease)   . Hypothyroidism   . PONV (postoperative nausea and vomiting)     Past Surgical History:  Procedure Laterality Date  . APPLICATION OF ROBOTIC ASSISTANCE FOR SPINAL PROCEDURE N/A 12/21/2016   Procedure: APPLICATION OF ROBOTIC ASSISTANCE FOR SPINAL PROCEDURE;  Surgeon: Kevan Ny Ditty, MD;  Location: [place];  Service: Neurosurgery;  Laterality: N/A;  . BACK SURGERY     L5-S1  . FOOT SURGERY    . HAND SURGERY     from [facility]  . KNEE ARTHROSCOPY     x2    Family History  Problem Relation Age of Onset  . Thyroid disease Sister   . Thyroid disease Brother    Social History:  reports that she has quit smoking. She has never used smokeless tobacco. She reports that she drinks alcohol. She reports that she does not use drugs.  Allergies:  Allergies  Allergen Reactions  . Ciprofloxacin Nausea And Vomiting  . Phentermine Other (See Comments)    Tearing, lightheadedness, depression  . Percocet [Oxycodone-Acetaminophen] Hives and Rash  . Tetanus Toxoids Nausea And Vomiting    No medications prior to admission.    No results found for this or any previous visit (from the past 48 hour(s)). No results found.  Review of Systems  Constitutional: Negative.   HENT: Negative.   Respiratory: Negative.   Cardiovascular: Negative.   Gastrointestinal: Positive for constipation.       Indigestion  Genitourinary: Negative.   Musculoskeletal: Positive for back pain.  Skin: Negative.   Neurological: Positive for  tingling.  Psychiatric/Behavioral: Negative.     There were no vitals taken for this visit. Physical Exam  Constitutional: She is oriented to person, place, and time. She appears well-developed. No distress.  HENT:  Head: Normocephalic.  Eyes: Pupils are equal, round, and reactive to light. EOM are normal.  Neck: Normal range of motion.  Respiratory: No respiratory distress.  GI: She exhibits no distension.  Musculoskeletal: She exhibits tenderness.  Neurological: She is alert and oriented to person, place, and time.  Skin: Skin is warm and dry.  Psychiatric: She has a normal mood and affect.     Assessment/Plan Left L5-S1 failed hardware, back pain and lower extremity radiculopathy.xrays CT shows loose pedicle screws, pseudarhrosis post fusion.   We will proceed with L5-S1 PEDICLE SCREW REVISION, BILATERAL LATERAL FUSION, ILIAC CREST BONE GRAFT as scheduled.  Surgical procedure along with possible rehab/recovery time discussed.  All questions answered.  Benjiman Core, PA-C 03/02/2018, 1:16 PM

## 2018-03-02 NOTE — Pre-Procedure Instructions (Signed)
   RHIANNA RAULERSON  03/02/2018     [facility] [address], [place] [address] Phone: [phone] Fax: [phone]   Your procedure is scheduled on Monday, March 07, 2018  Report to [facility] Admitting at 10:30 A.M.  Call this number if you have problems the morning of surgery:  [phone]   Remember: Follow your surgeon's pre-op instructions regarding Aspirin; if no instructions were provided please contact your surgeon's office.  Do not eat or drink after midnight Sunday, March 06, 2018  Take these medicines the morning of surgery with A SIP OF WATER : diazepam (VALIUM),  gabapentin (NEURONTIN),  levothyroxine (SYNTHROID),  ranitidine (ZANTAC), loratadine (CLARITIN), fluticasone (FLONASE) nasal spray,  If needed: albuterol (PROVENTIL HFA;VENTOLIN HFA)  Inhaler for wheezing or shortness of breath ( bring inhaler in with you on day of surgery).  Stop taking vitamins, fish oil and herbal medications. Do not take any NSAIDs ie: Ibuprofen, Advil, Naproxen (Aleve), Motrin, BC and Goody Powder; stop now.  Do not wear jewelry, make-up or nail polish.  Do not wear lotions, powders, or perfumes, or deodorant.  Do not shave 48 hours prior to surgery.   Do not bring valuables to the hospital.  [facility] is not responsible for any belongings or valuables.  Contacts, dentures or bridgework may not be worn into surgery.  Leave your suitcase in the car.  After surgery it may be brought to your room. Patients discharged the day of surgery will not be allowed to drive home.  Special instructions:Shower the night before and day of surgery with CHG. Please read over the following fact sheets that you were given. Pain Booklet, Coughing and Deep Breathing, MRSA Information and Surgical Site Infection Prevention

## 2018-03-02 NOTE — Progress Notes (Signed)
Pt denies any acute cardiopulmonary issues. Pt denies being under the care of a cardiologist. Pt denies having a stress test, echo and cardiac cath. Pt denies recent labs. Laura Cass, NP, anesthesia stated that chest x ray was  " okay;" no new orders.

## 2018-03-07 ENCOUNTER — Inpatient Hospital Stay (HOSPITAL_COMMUNITY): Payer: Self-pay

## 2018-03-07 ENCOUNTER — Inpatient Hospital Stay (HOSPITAL_COMMUNITY): Payer: Self-pay | Admitting: Anesthesiology

## 2018-03-07 ENCOUNTER — Other Ambulatory Visit: Payer: Self-pay

## 2018-03-07 ENCOUNTER — Inpatient Hospital Stay: Admit: 2018-03-07 | Payer: Medicaid Other | Admitting: Orthopaedic Surgery

## 2018-03-07 ENCOUNTER — Encounter (HOSPITAL_COMMUNITY): Payer: Self-pay | Admitting: *Deleted

## 2018-03-07 ENCOUNTER — Encounter (HOSPITAL_COMMUNITY)
Admission: RE | Disposition: A | Discharge status: Home / Self Care | Payer: Self-pay | Source: Home / Self Care | Attending: Orthopaedic Surgery

## 2018-03-07 ENCOUNTER — Inpatient Hospital Stay (HOSPITAL_COMMUNITY)
Admission: RE | Admit: 2018-03-07 | Discharge: 2018-03-08 | DRG: 460 | Disposition: A | Discharge status: Home / Self Care | Payer: Self-pay | Attending: Orthopaedic Surgery | Admitting: Orthopaedic Surgery

## 2018-03-07 DIAGNOSIS — M545 Low back pain, unspecified: Secondary | ICD-10-CM | POA: Diagnosis present

## 2018-03-07 DIAGNOSIS — Z87891 Personal history of nicotine dependence: Secondary | ICD-10-CM

## 2018-03-07 DIAGNOSIS — E039 Hypothyroidism, unspecified: Secondary | ICD-10-CM | POA: Diagnosis present

## 2018-03-07 DIAGNOSIS — M797 Fibromyalgia: Secondary | ICD-10-CM | POA: Diagnosis present

## 2018-03-07 DIAGNOSIS — T84226A Displacement of internal fixation device of vertebrae, initial encounter: Secondary | ICD-10-CM

## 2018-03-07 DIAGNOSIS — Z881 Allergy status to other antibiotic agents status: Secondary | ICD-10-CM

## 2018-03-07 DIAGNOSIS — M1990 Unspecified osteoarthritis, unspecified site: Secondary | ICD-10-CM | POA: Diagnosis present

## 2018-03-07 DIAGNOSIS — M960 Pseudarthrosis after fusion or arthrodesis: Secondary | ICD-10-CM

## 2018-03-07 DIAGNOSIS — M5410 Radiculopathy, site unspecified: Secondary | ICD-10-CM | POA: Diagnosis present

## 2018-03-07 DIAGNOSIS — K5900 Constipation, unspecified: Secondary | ICD-10-CM | POA: Diagnosis present

## 2018-03-07 DIAGNOSIS — Z419 Encounter for procedure for purposes other than remedying health state, unspecified: Secondary | ICD-10-CM

## 2018-03-07 DIAGNOSIS — K219 Gastro-esophageal reflux disease without esophagitis: Secondary | ICD-10-CM | POA: Diagnosis present

## 2018-03-07 DIAGNOSIS — Z887 Allergy status to serum and vaccine status: Secondary | ICD-10-CM

## 2018-03-07 DIAGNOSIS — Z981 Arthrodesis status: Secondary | ICD-10-CM

## 2018-03-07 DIAGNOSIS — Z8349 Family history of other endocrine, nutritional and metabolic diseases: Secondary | ICD-10-CM

## 2018-03-07 DIAGNOSIS — Z888 Allergy status to other drugs, medicaments and biological substances status: Secondary | ICD-10-CM

## 2018-03-07 SURGERY — POSTERIOR LUMBAR FUSION 1 WITH HARDWARE REMOVAL
Anesthesia: General | Site: Spine Lumbar

## 2018-03-07 SURGERY — POSTERIOR LUMBAR FUSION 1 WITH HARDWARE REMOVAL
Anesthesia: General

## 2018-03-07 MED ORDER — PHENYLEPHRINE 40 MCG/ML (10ML) SYRINGE FOR IV PUSH (FOR BLOOD PRESSURE SUPPORT)
PREFILLED_SYRINGE | INTRAVENOUS | Status: AC
  Filled 2018-03-07: qty 10

## 2018-03-07 MED ORDER — 0.9 % SODIUM CHLORIDE (POUR BTL) OPTIME
TOPICAL | Status: DC | PRN
  Administered 2018-03-07: 1000 mL

## 2018-03-07 MED ORDER — SUGAMMADEX SODIUM 200 MG/2ML IV SOLN
INTRAVENOUS | Status: AC
  Filled 2018-03-07: qty 4

## 2018-03-07 MED ORDER — LIDOCAINE 2% (20 MG/ML) 5 ML SYRINGE
INTRAMUSCULAR | Status: AC
  Filled 2018-03-07: qty 5

## 2018-03-07 MED ORDER — PHENYLEPHRINE 40 MCG/ML (10ML) SYRINGE FOR IV PUSH (FOR BLOOD PRESSURE SUPPORT)
PREFILLED_SYRINGE | INTRAVENOUS | Status: DC | PRN
  Administered 2018-03-07: 40 ug via INTRAVENOUS
  Administered 2018-03-07: 80 ug via INTRAVENOUS
  Administered 2018-03-07 (×2): 40 ug via INTRAVENOUS
  Administered 2018-03-07 (×2): 80 ug via INTRAVENOUS

## 2018-03-07 MED ORDER — GABAPENTIN 100 MG PO CAPS
ORAL_CAPSULE | ORAL | Status: AC
  Filled 2018-03-07: qty 2

## 2018-03-07 MED ORDER — CHLORHEXIDINE GLUCONATE 4 % EX LIQD: 60.0000 mL | Freq: Once | CUTANEOUS | Status: DC

## 2018-03-07 MED ORDER — FENTANYL CITRATE (PF) 100 MCG/2ML IJ SOLN
INTRAMUSCULAR | Status: AC
  Administered 2018-03-07: 50 ug via INTRAVENOUS
  Filled 2018-03-07: qty 2

## 2018-03-07 MED ORDER — SODIUM CHLORIDE 0.9 % IV SOLN
INTRAVENOUS | Status: DC
  Administered 2018-03-07: 19:00:00 via INTRAVENOUS

## 2018-03-07 MED ORDER — ROCURONIUM BROMIDE 10 MG/ML (PF) SYRINGE
PREFILLED_SYRINGE | INTRAVENOUS | Status: AC
  Filled 2018-03-07: qty 10

## 2018-03-07 MED ORDER — DEXAMETHASONE SODIUM PHOSPHATE 10 MG/ML IJ SOLN
INTRAMUSCULAR | Status: AC
  Filled 2018-03-07: qty 1

## 2018-03-07 MED ORDER — PHENYLEPHRINE HCL 10 MG/ML IJ SOLN
INTRAVENOUS | Status: DC | PRN
  Administered 2018-03-07: 20 ug/min via INTRAVENOUS

## 2018-03-07 MED ORDER — BUPIVACAINE HCL 0.5 % IJ SOLN
INTRAMUSCULAR | Status: DC | PRN
  Administered 2018-03-07: 20 mL

## 2018-03-07 MED ORDER — ARTIFICIAL TEARS OPHTHALMIC OINT
TOPICAL_OINTMENT | OPHTHALMIC | Status: AC
  Filled 2018-03-07: qty 3.5

## 2018-03-07 MED ORDER — SENNOSIDES-DOCUSATE SODIUM 8.6-50 MG PO TABS
1.0000 | ORAL_TABLET | Freq: Two times a day (BID) | ORAL | Status: DC
  Administered 2018-03-07 – 2018-03-08 (×2): 1 via ORAL
  Filled 2018-03-07 (×2): qty 1

## 2018-03-07 MED ORDER — ROCURONIUM BROMIDE 10 MG/ML (PF) SYRINGE
PREFILLED_SYRINGE | INTRAVENOUS | Status: DC | PRN
  Administered 2018-03-07: 50 mg via INTRAVENOUS
  Administered 2018-03-07: 20 mg via INTRAVENOUS

## 2018-03-07 MED ORDER — SCOPOLAMINE 1 MG/3DAYS TD PT72
MEDICATED_PATCH | TRANSDERMAL | Status: AC
  Administered 2018-03-07: 1.5 mg
  Filled 2018-03-07: qty 1

## 2018-03-07 MED ORDER — ONDANSETRON HCL 4 MG PO TABS: 4.0000 mg | ORAL_TABLET | Freq: Four times a day (QID) | ORAL | Status: DC | PRN

## 2018-03-07 MED ORDER — THROMBIN 20000 UNITS EX SOLR
CUTANEOUS | Status: AC
  Filled 2018-03-07: qty 20000

## 2018-03-07 MED ORDER — SCOPOLAMINE 1 MG/3DAYS TD PT72: 1.0000 | MEDICATED_PATCH | Freq: Once | TRANSDERMAL | Status: DC

## 2018-03-07 MED ORDER — HYDROMORPHONE HCL 1 MG/ML IJ SOLN: 0.5000 mg | INTRAMUSCULAR | Status: DC | PRN

## 2018-03-07 MED ORDER — GABAPENTIN 300 MG PO CAPS
800.0000 mg | ORAL_CAPSULE | Freq: Once | ORAL | Status: AC
  Administered 2018-03-07: 800 mg via ORAL

## 2018-03-07 MED ORDER — SODIUM CHLORIDE 0.9% FLUSH: 3.0000 mL | INTRAVENOUS | Status: DC | PRN

## 2018-03-07 MED ORDER — LORATADINE 10 MG PO TABS
10.0000 mg | ORAL_TABLET | Freq: Every day | ORAL | Status: DC
  Administered 2018-03-08: 10 mg via ORAL
  Filled 2018-03-07: qty 1

## 2018-03-07 MED ORDER — FENTANYL CITRATE (PF) 100 MCG/2ML IJ SOLN
25.0000 ug | INTRAMUSCULAR | Status: DC | PRN
  Administered 2018-03-07: 25 ug via INTRAVENOUS
  Administered 2018-03-07: 50 ug via INTRAVENOUS

## 2018-03-07 MED ORDER — LIDOCAINE 2% (20 MG/ML) 5 ML SYRINGE
INTRAMUSCULAR | Status: DC | PRN
  Administered 2018-03-07: 100 mg via INTRAVENOUS

## 2018-03-07 MED ORDER — MIDAZOLAM HCL 5 MG/5ML IJ SOLN
INTRAMUSCULAR | Status: DC | PRN
  Administered 2018-03-07: 2 mg via INTRAVENOUS

## 2018-03-07 MED ORDER — ARTIFICIAL TEARS OPHTHALMIC OINT
TOPICAL_OINTMENT | OPHTHALMIC | Status: DC | PRN
  Administered 2018-03-07: 1 via OPHTHALMIC

## 2018-03-07 MED ORDER — ATORVASTATIN CALCIUM 20 MG PO TABS
10.0000 mg | ORAL_TABLET | Freq: Every day | ORAL | Status: DC
  Administered 2018-03-07: 10 mg via ORAL
  Filled 2018-03-07: qty 1

## 2018-03-07 MED ORDER — GABAPENTIN 400 MG PO CAPS
800.0000 mg | ORAL_CAPSULE | Freq: Three times a day (TID) | ORAL | Status: DC
  Administered 2018-03-07 – 2018-03-08 (×2): 800 mg via ORAL
  Filled 2018-03-07 (×2): qty 2

## 2018-03-07 MED ORDER — CEFAZOLIN SODIUM-DEXTROSE 1-4 GM/50ML-% IV SOLN
1.0000 g | Freq: Three times a day (TID) | INTRAVENOUS | Status: AC
  Administered 2018-03-07 – 2018-03-08 (×2): 1 g via INTRAVENOUS
  Filled 2018-03-07 (×2): qty 50

## 2018-03-07 MED ORDER — METHOCARBAMOL 500 MG PO TABS
500.0000 mg | ORAL_TABLET | Freq: Four times a day (QID) | ORAL | Status: DC | PRN
  Administered 2018-03-07 – 2018-03-08 (×3): 500 mg via ORAL
  Filled 2018-03-07 (×2): qty 1

## 2018-03-07 MED ORDER — PROPOFOL 10 MG/ML IV BOLUS
INTRAVENOUS | Status: AC
  Filled 2018-03-07: qty 20

## 2018-03-07 MED ORDER — METHOCARBAMOL 1000 MG/10ML IJ SOLN
500.0000 mg | Freq: Four times a day (QID) | INTRAVENOUS | Status: DC | PRN
  Filled 2018-03-07: qty 5

## 2018-03-07 MED ORDER — ALBUTEROL SULFATE (2.5 MG/3ML) 0.083% IN NEBU: 3.0000 mL | INHALATION_SOLUTION | Freq: Four times a day (QID) | RESPIRATORY_TRACT | Status: DC | PRN

## 2018-03-07 MED ORDER — CEFAZOLIN SODIUM-DEXTROSE 2-4 GM/100ML-% IV SOLN
2.0000 g | INTRAVENOUS | Status: AC
  Administered 2018-03-07: 2 g via INTRAVENOUS
  Filled 2018-03-07: qty 100

## 2018-03-07 MED ORDER — PROPOFOL 10 MG/ML IV BOLUS
INTRAVENOUS | Status: DC | PRN
  Administered 2018-03-07: 150 mg via INTRAVENOUS

## 2018-03-07 MED ORDER — GABAPENTIN 300 MG PO CAPS
ORAL_CAPSULE | ORAL | Status: AC
  Filled 2018-03-07: qty 2

## 2018-03-07 MED ORDER — FLUTICASONE PROPIONATE 50 MCG/ACT NA SUSP
2.0000 | Freq: Every day | NASAL | Status: DC
  Filled 2018-03-07: qty 16

## 2018-03-07 MED ORDER — ONDANSETRON HCL 4 MG/2ML IJ SOLN
INTRAMUSCULAR | Status: AC
  Filled 2018-03-07: qty 2

## 2018-03-07 MED ORDER — LACTATED RINGERS IV SOLN
INTRAVENOUS | Status: DC | PRN
  Administered 2018-03-07 (×2): via INTRAVENOUS

## 2018-03-07 MED ORDER — METHOCARBAMOL 500 MG PO TABS
ORAL_TABLET | ORAL | Status: AC
  Administered 2018-03-07: 500 mg via ORAL
  Filled 2018-03-07: qty 1

## 2018-03-07 MED ORDER — GABAPENTIN 800 MG PO TABS
800.0000 mg | ORAL_TABLET | Freq: Three times a day (TID) | ORAL | Status: DC
  Filled 2018-03-07 (×2): qty 1

## 2018-03-07 MED ORDER — ONDANSETRON HCL 4 MG/2ML IJ SOLN: 4.0000 mg | Freq: Four times a day (QID) | INTRAMUSCULAR | Status: DC | PRN

## 2018-03-07 MED ORDER — MENTHOL 3 MG MT LOZG: 1.0000 | LOZENGE | OROMUCOSAL | Status: DC | PRN

## 2018-03-07 MED ORDER — DULOXETINE HCL 30 MG PO CPEP
120.0000 mg | ORAL_CAPSULE | Freq: Every day | ORAL | Status: DC
  Administered 2018-03-07: 120 mg via ORAL
  Filled 2018-03-07: qty 4

## 2018-03-07 MED ORDER — PROMETHAZINE HCL 25 MG/ML IJ SOLN: 6.2500 mg | INTRAMUSCULAR | Status: DC | PRN

## 2018-03-07 MED ORDER — HYDROCODONE-ACETAMINOPHEN 5-325 MG PO TABS
1.0000 | ORAL_TABLET | ORAL | Status: DC | PRN
  Administered 2018-03-07 – 2018-03-08 (×2): 2 via ORAL
  Administered 2018-03-08: 1 via ORAL
  Administered 2018-03-08: 2 via ORAL
  Filled 2018-03-07 (×2): qty 2
  Filled 2018-03-07: qty 1
  Filled 2018-03-07: qty 2

## 2018-03-07 MED ORDER — SODIUM CHLORIDE 0.9% FLUSH: 3.0000 mL | Freq: Two times a day (BID) | INTRAVENOUS | Status: DC

## 2018-03-07 MED ORDER — PHENOL 1.4 % MT LIQD: 1.0000 | OROMUCOSAL | Status: DC | PRN

## 2018-03-07 MED ORDER — LEVOTHYROXINE SODIUM 100 MCG PO TABS
100.0000 ug | ORAL_TABLET | Freq: Every day | ORAL | Status: DC
  Administered 2018-03-08: 100 ug via ORAL
  Filled 2018-03-07: qty 1

## 2018-03-07 MED ORDER — BUPIVACAINE HCL (PF) 0.5 % IJ SOLN
INTRAMUSCULAR | Status: AC
  Filled 2018-03-07: qty 30

## 2018-03-07 MED ORDER — LACTATED RINGERS IV SOLN
INTRAVENOUS | Status: DC
  Administered 2018-03-07: 12:00:00 via INTRAVENOUS

## 2018-03-07 MED ORDER — FENTANYL CITRATE (PF) 250 MCG/5ML IJ SOLN
INTRAMUSCULAR | Status: AC
  Filled 2018-03-07: qty 5

## 2018-03-07 MED ORDER — FAMOTIDINE 20 MG PO TABS
10.0000 mg | ORAL_TABLET | Freq: Every day | ORAL | Status: DC
  Administered 2018-03-08: 10 mg via ORAL
  Filled 2018-03-07: qty 1

## 2018-03-07 MED ORDER — ALBUMIN HUMAN 5 % IV SOLN
INTRAVENOUS | Status: DC | PRN
  Administered 2018-03-07: 15:00:00 via INTRAVENOUS

## 2018-03-07 MED ORDER — DEXAMETHASONE SODIUM PHOSPHATE 10 MG/ML IJ SOLN
INTRAMUSCULAR | Status: DC | PRN
  Administered 2018-03-07: 10 mg via INTRAVENOUS

## 2018-03-07 MED ORDER — OXYCODONE HCL 5 MG PO TABS: 5.0000 mg | ORAL_TABLET | ORAL | Status: DC | PRN

## 2018-03-07 MED ORDER — ONDANSETRON HCL 4 MG/2ML IJ SOLN
INTRAMUSCULAR | Status: DC | PRN
  Administered 2018-03-07: 4 mg via INTRAVENOUS

## 2018-03-07 MED ORDER — FENTANYL CITRATE (PF) 250 MCG/5ML IJ SOLN
INTRAMUSCULAR | Status: DC | PRN
  Administered 2018-03-07 (×2): 50 ug via INTRAVENOUS
  Administered 2018-03-07: 100 ug via INTRAVENOUS

## 2018-03-07 MED ORDER — MIDAZOLAM HCL 2 MG/2ML IJ SOLN
INTRAMUSCULAR | Status: AC
  Filled 2018-03-07: qty 2

## 2018-03-07 SURGICAL SUPPLY — 66 items
BANDAGE ACE 6X5 VEL STRL LF (GAUZE/BANDAGES/DRESSINGS) IMPLANT
BLADE CLIPPER SURG (BLADE) IMPLANT
BLADE SURG 10 STRL SS (BLADE) ×3 IMPLANT
BONE VIVIGEN FORMABLE 5.4CC (Bone Implant) ×3 IMPLANT
BUR ROUND FLUTED 4 SOFT TCH (BURR) ×2 IMPLANT
BUR ROUND FLUTED 4MM SOFT TCH (BURR) ×1
CAP SPINAL LOCKING TI (Cap) ×12 IMPLANT
CORDS BIPOLAR (ELECTRODE) ×3 IMPLANT
COVER BACK TABLE 80X110 HD (DRAPES) ×3 IMPLANT
COVER SURGICAL LIGHT HANDLE (MISCELLANEOUS) ×3 IMPLANT
DERMABOND ADVANCED (GAUZE/BANDAGES/DRESSINGS) ×2
DERMABOND ADVANCED .7 DNX12 (GAUZE/BANDAGES/DRESSINGS) ×1 IMPLANT
DRAPE C-ARM 42X72 X-RAY (DRAPES) ×3 IMPLANT
DRAPE HALF SHEET 40X57 (DRAPES) ×12 IMPLANT
DRAPE MICROSCOPE LEICA (MISCELLANEOUS) ×3 IMPLANT
DRAPE ORTHO SPLIT 77X108 STRL (DRAPES) ×2
DRAPE SURG 17X23 STRL (DRAPES) ×9 IMPLANT
DRAPE SURG ORHT 6 SPLT 77X108 (DRAPES) ×1 IMPLANT
DRSG EMULSION OIL 3X3 NADH (GAUZE/BANDAGES/DRESSINGS) ×3 IMPLANT
DRSG MEPILEX BORDER 4X4 (GAUZE/BANDAGES/DRESSINGS) ×3 IMPLANT
DRSG MEPILEX BORDER 4X8 (GAUZE/BANDAGES/DRESSINGS) ×6 IMPLANT
DRSG PAD ABDOMINAL 8X10 ST (GAUZE/BANDAGES/DRESSINGS) ×6 IMPLANT
DURAPREP 26ML APPLICATOR (WOUND CARE) ×3 IMPLANT
ELECT CAUTERY BLADE 6.4 (BLADE) ×3 IMPLANT
ELECT REM PT RETURN 9FT ADLT (ELECTROSURGICAL) ×3
ELECTRODE REM PT RTRN 9FT ADLT (ELECTROSURGICAL) ×1 IMPLANT
EVACUATOR 1/8 PVC DRAIN (DRAIN) ×3 IMPLANT
FLOSEAL 5ML (HEMOSTASIS) ×3 IMPLANT
GLOVE BIOGEL PI IND STRL 8 (GLOVE) ×2 IMPLANT
GLOVE BIOGEL PI INDICATOR 8 (GLOVE) ×4
GLOVE ORTHO TXT STRL SZ7.5 (GLOVE) ×6 IMPLANT
GOWN STRL REUS W/ TWL LRG LVL3 (GOWN DISPOSABLE) ×1 IMPLANT
GOWN STRL REUS W/ TWL XL LVL3 (GOWN DISPOSABLE) ×1 IMPLANT
GOWN STRL REUS W/TWL 2XL LVL3 (GOWN DISPOSABLE) ×3 IMPLANT
GOWN STRL REUS W/TWL LRG LVL3 (GOWN DISPOSABLE) ×2
GOWN STRL REUS W/TWL XL LVL3 (GOWN DISPOSABLE) ×2
HEMOSTAT SURGICEL 2X14 (HEMOSTASIS) IMPLANT
KIT BASIN OR (CUSTOM PROCEDURE TRAY) ×3 IMPLANT
KIT TURNOVER KIT B (KITS) ×3 IMPLANT
MANIFOLD NEPTUNE II (INSTRUMENTS) ×3 IMPLANT
NEEDLE HYPO 25X1 1.5 SAFETY (NEEDLE) ×3 IMPLANT
NEEDLE SPNL 18GX3.5 QUINCKE PK (NEEDLE) ×9 IMPLANT
NS IRRIG 1000ML POUR BTL (IV SOLUTION) ×3 IMPLANT
PACK LAMINECTOMY ORTHO (CUSTOM PROCEDURE TRAY) ×3 IMPLANT
PAD ARMBOARD 7.5X6 YLW CONV (MISCELLANEOUS) ×6 IMPLANT
PATTIES SURGICAL .5 X.5 (GAUZE/BANDAGES/DRESSINGS) ×6 IMPLANT
PATTIES SURGICAL .75X.75 (GAUZE/BANDAGES/DRESSINGS) IMPLANT
ROD 35MMX5.5 (Rod) ×6 IMPLANT
SCREW MATRIX MIS 6.0X35MM (Screw) ×6 IMPLANT
SCREW MATRIX MIS 7.0X45MM (Screw) ×6 IMPLANT
SPONGE LAP 18X18 X RAY DECT (DISPOSABLE) IMPLANT
SPONGE LAP 4X18 RFD (DISPOSABLE) IMPLANT
SPONGE SURGIFOAM ABS GEL SZ50 (HEMOSTASIS) IMPLANT
STAPLER VISISTAT 35W (STAPLE) IMPLANT
SURGIFLO W/THROMBIN 8M KIT (HEMOSTASIS) ×3 IMPLANT
SUT BONE WAX W31G (SUTURE) ×3 IMPLANT
SUT VIC AB 1 CTX 36 (SUTURE)
SUT VIC AB 1 CTX36XBRD ANBCTR (SUTURE) IMPLANT
SUT VIC AB 2-0 CT1 27 (SUTURE) ×2
SUT VIC AB 2-0 CT1 TAPERPNT 27 (SUTURE) ×1 IMPLANT
SUT VIC AB 3-0 X1 27 (SUTURE) ×3 IMPLANT
TOWEL OR 17X24 6PK STRL BLUE (TOWEL DISPOSABLE) ×3 IMPLANT
TOWEL OR 17X26 10 PK STRL BLUE (TOWEL DISPOSABLE) ×3 IMPLANT
TRAY FOLEY MTR SLVR 16FR STAT (SET/KITS/TRAYS/PACK) ×3 IMPLANT
WATER STERILE IRR 1000ML POUR (IV SOLUTION) ×3 IMPLANT
YANKAUER SUCT BULB TIP NO VENT (SUCTIONS) ×3 IMPLANT

## 2018-03-07 NOTE — Interval H&P Note (Signed)
History and Physical Interval Note:  03/07/2018 12:38 PM  Laura Burnett  has presented today for surgery, with the diagnosis of L5-S1, PSEUDARTHROSIS  The various methods of treatment have been discussed with the patient and family. After consideration of risks, benefits and other options for treatment, the patient has consented to  Procedure(s): L5-S1 PEDICLE SCREW REVISION, BILATERAL LATERAL FUSION, ILIAC CREST BONE GRAFT (N/A) as a surgical intervention .  The patient's history has been reviewed, patient examined, no change in status, stable for surgery.  I have reviewed the patient's chart and labs.  Questions were answered to the patient's satisfaction.     [name]

## 2018-03-07 NOTE — Anesthesia Preprocedure Evaluation (Addendum)
Anesthesia Evaluation  Patient identified by MRN, date of birth, ID band Patient awake    Reviewed: Allergy & Precautions, NPO status , Patient's Chart, lab work & pertinent test results  History of Anesthesia Complications (+) PONV and history of anesthetic complications  Airway Mallampati: II  TM Distance: >3 FB Neck ROM: Full    Dental  (+) Teeth Intact, Dental Advisory Given   Pulmonary asthma , former smoker,    Pulmonary exam normal breath sounds clear to auscultation       Cardiovascular Exercise Tolerance: Good negative cardio ROS Normal cardiovascular exam Rhythm:Regular Rate:Normal     Neuro/Psych  Headaches, PSYCHIATRIC DISORDERS Anxiety Depression  Neuromuscular disease (BLE numbness)    GI/Hepatic Neg liver ROS, GERD  Medicated,  Endo/Other  Hypothyroidism Obesity   Renal/GU negative Renal ROS     Musculoskeletal  (+) Arthritis , Fibromyalgia -  Abdominal   Peds  Hematology negative hematology ROS (+)   Anesthesia Other Findings Day of surgery medications reviewed with the patient.  Reproductive/Obstetrics                            Anesthesia Physical Anesthesia Plan  ASA: III  Anesthesia Plan: General   Post-op Pain Management:    Induction: Intravenous  PONV Risk Score and Plan: 4 or greater and Scopolamine patch - Pre-op, Midazolam, Dexamethasone and Ondansetron  Airway Management Planned: Oral ETT  Additional Equipment:   Intra-op Plan:   Post-operative Plan: Extubation in OR  Informed Consent: I have reviewed the patients History and Physical, chart, labs and discussed the procedure including the risks, benefits and alternatives for the proposed anesthesia with the patient or authorized representative who has indicated his/her understanding and acceptance.   Dental advisory given  Plan Discussed with: CRNA  Anesthesia Plan Comments:          Anesthesia Quick Evaluation

## 2018-03-07 NOTE — Op Note (Signed)
Preop diagnosis: Previous L5-S1 instrumented posterior lateral interbody fusion with painful pseudoarthrosis and loose pedicle screws.  Postop diagnosis: Same  Procedure: Removal of posterior segmental pedicle instrumentation L5-S1, screws and rods.  Bilateral lateral lumbar fusion with left iliac crest cortical cancellus bone graft obtained through separate incision.  Placement revision L5-S1 pedicle instrumentation and re-compression. Vivagen 5cc plus left ICBG.  Surgeon: Rodell Perna, MD  Assistant: Benjiman Core, PA-C medically necessary and present for the entire procedure  Anesthesia: General  EBL: Approximately 200 cc  Removal of [employer] pedicle instrumentation L5-S1, 5.5 x 35 mm screws in L5 and 6.5 x 35 mm screws in S1.  Placement of [employer] Depuy 6 x 35 mm screws right and left L5, 7 x 35 mm screws sacrum, right and left.  35 mm rods x2.  Left iliac crest bone graft through separate incision with Vivagen 5cc augmentation.  Brief history: 52 year old female had previous surgery by Dr. Cyndy Freeze in April 2008 with persistent pain and loose pedicle screws documented on plain radiographs as well as CT scan without residual compression.  CT scan showed pseudoarthrosis with previous interbody cages x2 and BMP was used.  Patient had persistent pain failed conservative treatment including medications, anti-inflammatories, Neurontin etc.  Dr. Cyndy Freeze has moved out of [place] and patient transferred her care for treatment of her painful lumbar pseudoarthrosis.  Procedure: After induction general anesthesia orotracheal patient, patient was placed prone on spine frame careful padding over the ulnar nerves rolled yellow foam pads underneath the shoulders calf pumpers, iliac pads, careful positioning.  DuraPrep was used after the area squared with 1015 drapes and as the DuraPrep was drying timeout procedure was completed.  Ancef was given prophylactically 2 g.  Old incision was outlined with a purple  skin marker cross lines drawn Betadine Steri-Drape and laminectomy sheet.  Midline incision was made extending proximally distally 2 cm and dissection down to the L4 spinous process and S1 spinous process was performed.  Following up to the facets the pedicle instrumentation at L5-S1 was exposed and meticulous cleaning of scar tissue off of the instrumentation was performed using Bayer rondure, pituitary, black nerve.  Once instrumentation was clean caps were removed.  There was obvious rocking of the S1 screws which were loose and the L5 screws did not have radiographic loosening but easily came out and were not tight.  Transverse process was exposed at L5 as well as sacral ala bilaterally and decortication was performed with the burr and also with the osteotome and mallet.  Left iliac crest bone graft was exposed through separate incision.  There was 6 to 8 cm of adipose tissue before the crest was exposed and cortical cancellous strips were harvested using gouges straight curettes angled curettes harvesting the bone underneath the iliac crest And maintaining the medial cortex.  Care was taken to make sure there is no violation into the sacroiliac joint.  Sponge was placed in this and then the Vivagen 5 cc was mixed to augment the patient's cortical cancellus graft.  This was split into pieces and then carefully placed down over the decorticated transverse process right and left and into the sacral ala that been decorticated with the bur and osteotome to good bleeding surface.  Larger screws were then placed in the 4 holes which were in satisfactory position by preoperative CT imaging placing larger screws that had been removed by 1/2 mm.  Screws gave good tight bite.  35 mm rods were placed tightened down compressed on  both sides and C arm imaging was performed to confirm satisfactory position identical to previous length screws but with larger screws giving good cortical bite.  Copious irrigation Hemovac was  placed in the iliac crest fascia was reapproximated with #1 Vicryl 2-0 Vicryl subtenons tissue skin staple closure.  #1 Vicryl was used for deep fascia closure 2-0 Vicryl closure with some 0 Vicryl and then skin staple closure in the midline.  This patient was being transferred back to the spine position the iliac crest drain pulled out.  Wound had already been closed and replacement will require complete re-exposure.  Dressings were applied patient was transferred to the supine position extubated and transferred to recovery room.

## 2018-03-07 NOTE — Anesthesia Procedure Notes (Signed)
Procedure Name: Intubation Date/Time: 03/07/2018 1:58 PM Performed by: Harden Mo, CRNA Pre-anesthesia Checklist: Patient identified, Emergency Drugs available, Suction available and Patient being monitored Patient Re-evaluated:Patient Re-evaluated prior to induction Oxygen Delivery Method: Circle System Utilized Preoxygenation: Pre-oxygenation with 100% oxygen Induction Type: IV induction Ventilation: Mask ventilation without difficulty Laryngoscope Size: Miller and 2 Grade View: Grade I Tube type: Oral Tube size: 7.5 mm Number of attempts: 1 Airway Equipment and Method: Stylet and Oral airway Placement Confirmation: ETT inserted through vocal cords under direct vision,  positive ETCO2 and breath sounds checked- equal and bilateral Secured at: 22 cm Tube secured with: Tape Dental Injury: Teeth and Oropharynx as per pre-operative assessment

## 2018-03-07 NOTE — Progress Notes (Signed)
This note also relates to the following rows which could not be included: Pulse Rate - Cannot attach notes to unvalidated device data ECG Heart Rate - Cannot attach notes to unvalidated device data Resp - Cannot attach notes to unvalidated device data SpO2 - Cannot attach notes to unvalidated device data  Pt awakens and states she is in a lot of pain.  Pt questioned on 1-10 scale about pain level.  Pt noted to have fallen back to sleep before she answered pain level.

## 2018-03-07 NOTE — Transfer of Care (Signed)
Immediate Anesthesia Transfer of Care Note  Patient: Laura Burnett  Procedure(s) Performed: L5-S1 PEDICLE SCREW REVISION, BILATERAL LATERAL FUSION, ILIAC CREST BONE GRAFT (N/A Spine Lumbar)  Patient Location: PACU  Anesthesia Type:General  Level of Consciousness: awake and patient cooperative  Airway & Oxygen Therapy: Patient Spontanous Breathing and Patient connected to face mask oxygen  Post-op Assessment: Report given to RN, Post -op Vital signs reviewed and stable and Patient moving all extremities X 4  Post vital signs: Reviewed and stable  Last Vitals:  Vitals Value Taken Time  BP 119/81 03/07/2018  4:13 PM  Temp    Pulse 106 03/07/2018  4:14 PM  Resp 17 03/07/2018  4:14 PM  SpO2 99 % 03/07/2018  4:14 PM  Vitals shown include unvalidated device data.  Last Pain:  Vitals:   03/07/18 1128  TempSrc:   PainSc: 7          Complications: No apparent anesthesia complications

## 2018-03-08 LAB — BASIC METABOLIC PANEL
Anion gap: 9 (ref 5–15)
BUN: 6 mg/dL (ref 6–20)
CALCIUM: 8.5 mg/dL — AB (ref 8.9–10.3)
CO2: 24 mmol/L (ref 22–32)
CREATININE: 0.84 mg/dL (ref 0.44–1.00)
Chloride: 108 mmol/L (ref 98–111)
GFR calc Af Amer: >60 mL/min (ref >60)
GLUCOSE: 127 mg/dL — AB (ref 70–99)
Potassium: 4.6 mmol/L (ref 3.5–5.1)
Sodium: 141 mmol/L (ref 135–145)

## 2018-03-08 LAB — CBC
HEMATOCRIT: 36.1 % (ref 36.0–46.0)
Hemoglobin: 11.3 g/dL — ABNORMAL LOW (ref 12.0–15.0)
MCH: 29 pg (ref 26.0–34.0)
MCHC: 31.3 g/dL (ref 30.0–36.0)
MCV: 92.8 fL (ref 78.0–100.0)
PLATELETS: 285 10*3/uL (ref 150–400)
RBC: 3.89 MIL/uL (ref 3.87–5.11)
RDW: 13.8 % (ref 11.5–15.5)
WBC: 10.3 10*3/uL (ref 4.0–10.5)

## 2018-03-08 MED ORDER — HYDROCODONE-ACETAMINOPHEN 5-325 MG PO TABS: 1.0000 | ORAL_TABLET | ORAL | 0 refills | Status: DC | PRN

## 2018-03-08 NOTE — Anesthesia Postprocedure Evaluation (Signed)
Anesthesia Post Note  Patient: Laura Burnett  Procedure(s) Performed: L5-S1 PEDICLE SCREW REVISION, BILATERAL LATERAL FUSION, ILIAC CREST BONE GRAFT (N/A Spine Lumbar)     Patient location during evaluation: PACU Anesthesia Type: General Level of consciousness: awake and alert Pain management: pain level controlled Vital Signs Assessment: post-procedure vital signs reviewed and stable Respiratory status: spontaneous breathing, nonlabored ventilation and respiratory function stable Cardiovascular status: blood pressure returned to baseline and stable Postop Assessment: no apparent nausea or vomiting Anesthetic complications: no    Last Vitals:  Vitals:   03/08/18 0720 03/08/18 1217  BP: 100/70 104/69  Pulse: 97 (!) 102  Resp: 16 16  Temp: 36.8 C 36.6 C  SpO2: 98% 98%    Last Pain:  Vitals:   03/08/18 1217  TempSrc: Oral  PainSc:                  Catalina Gravel

## 2018-03-08 NOTE — Discharge Instructions (Signed)
Ok to shower, office 10 days, walk daily, use your brace when you get up. No bending, or lifting.

## 2018-03-08 NOTE — Evaluation (Signed)
Occupational Therapy Evaluation and Discharge Patient Details Name: Laura Burnett MRN: [id number] DOB: December 02, 1965 Today's Date: 03/08/2018    History of Present Illness Pt is a 52 y/o female who presents s/p L5-S1 pedicle screw revision, bilateral lateral fusion, and iliac crest bone graft on 03/07/18. PMH significant for hypothyroidism, fibromyalgia, asthma, L5-S1 spinal surgery 11/2016.   Clinical Impression   PTA, pt was independent with ADL and functional mobility. She currently is able to complete ADL with overall supervision using adaptive equipment for LB ADL. Educated concerning use of reacher and sock aide to adhere to precautions and provided these. Pt educated on: home set-up to adhere to back precautions during ADL/IADL, shower transfers, use clothing between brace, never sleep in brace, avoiding lifting more than 5 pounds and never wash directly over incision. All education is complete and patient indicates understanding. No further acute OT needs identified.        Follow Up Recommendations  No OT follow up;Supervision/Assistance - 24 hour(initial)    Equipment Recommendations  None recommended by OT    Recommendations for Other Services       Precautions / Restrictions Precautions Precautions: Back;Fall Precaution Booklet Issued: Yes (comment) Precaution Comments: Handout provided by PT.  Required Braces or Orthoses: Spinal Brace Spinal Brace: Lumbar corset(No present during session. Cleared for therapy with RN/MD) Restrictions Weight Bearing Restrictions: No      Mobility Bed Mobility Overal bed mobility: Modified Independent             General bed mobility comments: Seated at EOB on my arrival.   Transfers Overall transfer level: Modified independent Equipment used: None             General transfer comment: No assistance required    Balance Overall balance assessment: Needs assistance Sitting-balance support: Feet supported;No upper extremity  supported Sitting balance-Leahy Scale: Fair     Standing balance support: No upper extremity supported;During functional activity Standing balance-Leahy Scale: Fair                             ADL either performed or assessed with clinical judgement   ADL Overall ADL's : Needs assistance/impaired Eating/Feeding: Set up;Sitting   Grooming: Supervision/safety;Standing   Upper Body Bathing: Set up;Sitting   Lower Body Bathing: Sit to/from stand;With adaptive equipment;Supervison/ safety   Upper Body Dressing : Set up;Sitting   Lower Body Dressing: Supervision/safety;Sit to/from stand   Toilet Transfer: Supervision/safety;Ambulation   Toileting- Clothing Manipulation and Hygiene: Supervision/safety;Sit to/from stand   Tub/ Shower Transfer: Supervision/safety;Ambulation;Shower seat;Walk-in shower   Functional mobility during ADLs: Supervision/safety General ADL Comments: Pt educated concerning use of AE for LB ADL and this was provided per grant. Pt is able to complete ADL with compensatory strategies with supervision.      Vision Patient Visual Report: No change from baseline Vision Assessment?: No apparent visual deficits     Perception     Praxis      Pertinent Vitals/Pain Pain Assessment: Faces Faces Pain Scale: Hurts even more Pain Location: Incision site; bone graft site Pain Descriptors / Indicators: Operative site guarding;Discomfort Pain Intervention(s): Limited activity within patient's tolerance;Monitored during session;Repositioned     Hand Dominance Right   Extremity/Trunk Assessment Upper Extremity Assessment Upper Extremity Assessment: Overall WFL for tasks assessed   Lower Extremity Assessment Lower Extremity Assessment: Defer to PT evaluation RLE Deficits / Details: Pt reports numbness from knee to foot. Mild weakness noted.  RLE Sensation: decreased  light touch LLE Deficits / Details: Numbness from hip to knee. Moderate weakness  noted when LLE is stressed. LLE Sensation: decreased light touch   Cervical / Trunk Assessment Cervical / Trunk Assessment: Other exceptions Cervical / Trunk Exceptions: s/p surgery   Communication Communication Communication: No difficulties   Cognition Arousal/Alertness: Awake/alert Behavior During Therapy: WFL for tasks assessed/performed Overall Cognitive Status: Within Functional Limits for tasks assessed                                     General Comments       Exercises     Shoulder Instructions      Home Living Family/patient expects to be discharged to:: Private residence Living Arrangements: Alone Available Help at Discharge: Family;Available PRN/intermittently Type of Home: House Home Access: Stairs to enter [employer] of Steps: 2   Home Layout: One level     Bathroom Shower/Tub: Occupational psychologist: Standard     Home Equipment: Cane - single point;Shower seat          Prior Functioning/Environment Level of Independence: Independent                 OT Problem List: Decreased strength;Decreased range of motion;Decreased activity tolerance;Impaired balance (sitting and/or standing);Decreased safety awareness;Decreased knowledge of use of DME or AE;Decreased knowledge of precautions;Pain      OT Treatment/Interventions:      OT Goals(Current goals can be found in the care plan section) Acute Rehab OT Goals Patient Stated Goal: Home today OT Goal Formulation: With patient Time For Goal Achievement: 03/22/18 Potential to Achieve Goals: Good  OT Frequency:     Barriers to D/C:            Co-evaluation              AM-PAC PT "6 Clicks" Daily Activity     Outcome Measure Help from another person eating meals?: None Help from another person taking care of personal grooming?: None Help from another person toileting, which includes using toliet, bedpan, or urinal?: None Help from another person  bathing (including washing, rinsing, drying)?: None Help from another person to put on and taking off regular upper body clothing?: None Help from another person to put on and taking off regular lower body clothing?: None 6 Click Score: 24   End of Session Equipment Utilized During Treatment: Gait belt Nurse Communication: Mobility status  Activity Tolerance: Patient tolerated treatment well Patient left: with call bell/phone within reach;in bed(seated at EOB)  OT Visit Diagnosis: Unsteadiness on feet (R26.81);Pain Pain - part of body: (back)                Time: 5638-7564 OT Time Calculation (min): 40 min Charges:  OT General Charges $OT Visit: 1 Visit OT Evaluation $OT Eval Moderate Complexity: 1 Mod OT Treatments $Self Care/Home Management : 23-37 mins G-Codes:     Norman Herrlich, MS OTR/L  Pager: [phone]  03/08/2018, 10:28 AM

## 2018-03-08 NOTE — Evaluation (Signed)
Physical Therapy Evaluation and Discharge Patient Details Name: Laura Burnett MRN: [id number] DOB: [birth year] Today's Date: 03/08/2018   History of Present Illness  Pt is a 52 y/o female who presents s/p L5-S1 pedicle screw revision, bilateral lateral fusion, and iliac crest bone graft on 03/07/18. PMH significant for hypothyroidism, fibromyalgia, asthma, L5-S1 spinal surgery 11/2016.  Clinical Impression  Patient evaluated by Physical Therapy with no further acute PT needs identified. All education has been completed and the patient has no further questions. At the time of PT eval pt was able to perform transfers and ambulation with gross modified independence and occasional supervision for safety (stairs). Pt familiar with back precautions from prior surgery and maintained well throughout session. Pt was educated on activity progression, brace application/wearing schedule, car transfer, and general safety with mobility in her home environment. See below for any follow-up Physical Therapy or equipment needs. PT is signing off. Thank you for this referral.     Follow Up Recommendations No PT follow up;Supervision - Intermittent    Equipment Recommendations  None recommended by PT    Recommendations for Other Services       Precautions / Restrictions Precautions Precautions: Back;Fall Precaution Booklet Issued: Yes (comment) Precaution Comments: Reviewed handout in detail. Pt was cued for maintenance of precautions during functional mobility.  Required Braces or Orthoses: Spinal Brace Spinal Brace: Lumbar corset(Not present for session. Per RN/MD ok to work with therapy) Restrictions Weight Bearing Restrictions: No      Mobility  Bed Mobility Overal bed mobility: Modified Independent             General bed mobility comments: Min use of rails but no assist required. Pt demonstrated log roll well.   Transfers Overall transfer level: Modified independent Equipment used: None              General transfer comment: No assist required. Pt demonstrated proper hand placement on seated surface for safety.   Ambulation/Gait Ambulation/Gait assistance: Supervision;Modified independent (Device/Increase time) Gait Distance (Feet): 250 Feet Assistive device: None Gait Pattern/deviations: Step-through pattern;Decreased stride length;Wide base of support Gait velocity: Decreased Gait velocity interpretation: 1.31 - 2.62 ft/sec, indicative of limited community ambulator General Gait Details: Supervision initially progressing to modified independent by end of gait training. Pt overall moving slow but steady.   Stairs Stairs: Yes Stairs assistance: Supervision Stair Management: One rail Left;Step to pattern;Forwards Number of Stairs: 9 General stair comments: VC's for sequencing and general safety. No assist required.   Wheelchair Mobility    Modified Rankin (Stroke Patients Only)       Balance Overall balance assessment: Needs assistance Sitting-balance support: Feet supported;No upper extremity supported Sitting balance-Leahy Scale: Fair     Standing balance support: No upper extremity supported;During functional activity Standing balance-Leahy Scale: Fair                               Pertinent Vitals/Pain Pain Assessment: Faces Faces Pain Scale: Hurts even more Pain Location: Incision site; bone graft site Pain Descriptors / Indicators: Operative site guarding;Discomfort Pain Intervention(s): Limited activity within patient's tolerance;Monitored during session;Repositioned    Home Living Family/patient expects to be discharged to:: Private residence Living Arrangements: Alone Available Help at Discharge: Family;Available PRN/intermittently Type of Home: House Home Access: Stairs to enter   [employer] of Steps: 2 Home Layout: One level Home Equipment: Cane - single point      Prior Function Level of Independence:  Independent               Hand Dominance   Dominant Hand: Right    Extremity/Trunk Assessment   Upper Extremity Assessment Upper Extremity Assessment: Overall WFL for tasks assessed    Lower Extremity Assessment Lower Extremity Assessment: RLE deficits/detail;LLE deficits/detail RLE Deficits / Details: Pt reports numbness from knee to foot. Mild weakness noted.  RLE Sensation: decreased light touch LLE Deficits / Details: Numbness from hip to knee. Moderate weakness noted when LLE is stressed. LLE Sensation: decreased light touch    Cervical / Trunk Assessment Cervical / Trunk Assessment: Other exceptions Cervical / Trunk Exceptions: s/p surgery  Communication   Communication: No difficulties  Cognition Arousal/Alertness: Awake/alert Behavior During Therapy: WFL for tasks assessed/performed Overall Cognitive Status: Within Functional Limits for tasks assessed                                        General Comments      Exercises     Assessment/Plan    PT Assessment Patent does not need any further PT services  PT Problem List         PT Treatment Interventions      PT Goals (Current goals can be found in the Care Plan section)  Acute Rehab PT Goals Patient Stated Goal: Home today PT Goal Formulation: All assessment and education complete, DC therapy    Frequency     Barriers to discharge        Co-evaluation               AM-PAC PT "6 Clicks" Daily Activity  Outcome Measure Difficulty turning over in bed (including adjusting bedclothes, sheets and blankets)?: None Difficulty moving from lying on back to sitting on the side of the bed? : None Difficulty sitting down on and standing up from a chair with arms (e.g., wheelchair, bedside commode, etc,.)?: None Help needed moving to and from a bed to chair (including a wheelchair)?: None Help needed walking in hospital room?: None Help needed climbing 3-5 steps with a railing? :  A Little 6 Click Score: 23    End of Session Equipment Utilized During Treatment: Gait belt Activity Tolerance: Patient tolerated treatment well Patient left: with call bell/phone within reach(Sitting EOB to eat breakfast) Nurse Communication: Mobility status PT Visit Diagnosis: Unsteadiness on feet (R26.81);Pain Pain - part of body: (back)    Time: 1610-9604 PT Time Calculation (min) (ACUTE ONLY): 25 min   Charges:   PT Evaluation $PT Eval Moderate Complexity: 1 Mod PT Treatments $Gait Training: 8-22 mins   PT G Codes:        Laura Burnett, PT, DPT Acute Rehabilitation Services Pager: [phone]  Thelma Comp 03/08/2018, 8:32 AM

## 2018-03-08 NOTE — Progress Notes (Signed)
   Subjective: 1 Day Post-Op Procedure(s) (LRB): L5-S1 PEDICLE SCREW REVISION, BILATERAL LATERAL FUSION, ILIAC CREST BONE GRAFT (N/A) Patient reports pain as moderate.  ICBG site is major pain area. Walking , has cane and brace at home.   Objective: Vital signs in last 24 hours: Temp:  [97.3 F (36.3 C)-98.4 F (36.9 C)] 98.3 F (36.8 C) (07/09 0720) Pulse Rate:  [83-108] 97 (07/09 0720) Resp:  [12-19] 16 (07/09 0720) BP: (96-119)/(68-82) 100/70 (07/09 0720) SpO2:  [91 %-100 %] 98 % (07/09 0720) Weight:  [201 lb 4 oz (91.3 kg)] 201 lb 4 oz (91.3 kg) (07/08 1106)  Intake/Output from previous day: 07/08 0701 - 07/09 0700 In: 2715.5 [I.V.:2415.5; IV Piggyback:300] Out: 300 [Urine:150; Blood:150] Intake/Output this shift: No intake/output data recorded.  Recent Labs    03/08/18 0524  HGB 11.3*   Recent Labs    03/08/18 0524  WBC 10.3  RBC 3.89  HCT 36.1  PLT 285   Recent Labs    03/08/18 0524  NA 141  K 4.6  CL 108  CO2 24  BUN 6  CREATININE 0.84  GLUCOSE 127*  CALCIUM 8.5*   No results for input(s): LABPT, INR in the last 72 hours.  Neurologically intact Dg Lumbar Spine 2-3 Views  Result Date: 03/07/2018 CLINICAL DATA:  52 year old female post failed fusion L5-S1. Initial encounter. EXAM: DG C-ARM 61-120 MIN; LUMBAR SPINE - 2-3 VIEW Fluoroscopic time: 6 seconds. COMPARISON:  10/05/2017 CT. FINDINGS: Two intraoperative C-arm views submitted for review after surgery. Revision L5-S1 fusion. Evaluation limited. This can be assessed on follow-up. IMPRESSION: Revision L5-S1 fusion. Evaluation limited. This can be assessed on follow-up. Electronically Signed   By: Genia Del M.D.   On: 03/07/2018 16:31   Dg C-arm 1-60 Min  Result Date: 03/07/2018 CLINICAL DATA:  52 year old female post failed fusion L5-S1. Initial encounter. EXAM: DG C-ARM 61-120 MIN; LUMBAR SPINE - 2-3 VIEW Fluoroscopic time: 6 seconds. COMPARISON:  10/05/2017 CT. FINDINGS: Two intraoperative C-arm  views submitted for review after surgery. Revision L5-S1 fusion. Evaluation limited. This can be assessed on follow-up. IMPRESSION: Revision L5-S1 fusion. Evaluation limited. This can be assessed on follow-up. Electronically Signed   By: Genia Del M.D.   On: 03/07/2018 16:31    Assessment/Plan: 1 Day Post-Op Procedure(s) (LRB): L5-S1 PEDICLE SCREW REVISION, BILATERAL LATERAL FUSION, ILIAC CREST BONE GRAFT (N/A) Up with therapy,  Son bringing brace, home today. Office one week.   [name] 03/08/2018, 7:52 AM

## 2018-03-08 NOTE — Progress Notes (Signed)
Patient is discharged from room 3C07 at this time. Alert and in stable condition. IV site d/c'd and instructions read to patient and son with understanding verbalized. Left unit via wheelchair with all belongings at side.

## 2018-03-09 ENCOUNTER — Ambulatory Visit (INDEPENDENT_AMBULATORY_CARE_PROVIDER_SITE_OTHER): Payer: Self-pay | Admitting: Surgery

## 2018-03-10 ENCOUNTER — Ambulatory Visit (INDEPENDENT_AMBULATORY_CARE_PROVIDER_SITE_OTHER): Payer: Self-pay | Admitting: Orthopaedic Surgery

## 2018-03-10 ENCOUNTER — Encounter (INDEPENDENT_AMBULATORY_CARE_PROVIDER_SITE_OTHER): Payer: Self-pay | Admitting: Orthopaedic Surgery

## 2018-03-10 VITALS — BP 115/88 | HR 112 | Temp 97.8°F | Ht 63.0 in | Wt 200.0 lb

## 2018-03-10 DIAGNOSIS — Z981 Arthrodesis status: Secondary | ICD-10-CM

## 2018-03-10 NOTE — Progress Notes (Signed)
Patient returns post L5-S1 revision with pedicle screw fixation larger screws and bilateral lateral bone graft.  She had significant swelling left buttocks where the iliac crest bone graft was obtained slight erythema no cellulitis no temperature temp 97 8.  She is been taking hydrocodone for pain but still has postop pain as expected.  She will keep her appointment next week.  Dressing was changed no evidence of infection.

## 2018-03-14 ENCOUNTER — Encounter (HOSPITAL_COMMUNITY): Payer: Self-pay | Admitting: Psychiatry

## 2018-03-14 NOTE — Telephone Encounter (Signed)
This encounter was created in error - please disregard.

## 2018-03-17 ENCOUNTER — Ambulatory Visit (INDEPENDENT_AMBULATORY_CARE_PROVIDER_SITE_OTHER): Payer: Self-pay | Admitting: Orthopaedic Surgery

## 2018-03-17 ENCOUNTER — Ambulatory Visit (INDEPENDENT_AMBULATORY_CARE_PROVIDER_SITE_OTHER): Payer: Self-pay

## 2018-03-17 ENCOUNTER — Encounter (INDEPENDENT_AMBULATORY_CARE_PROVIDER_SITE_OTHER): Payer: Self-pay | Admitting: Orthopaedic Surgery

## 2018-03-17 VITALS — BP 101/70 | HR 110 | Ht 62.0 in | Wt 205.0 lb

## 2018-03-17 DIAGNOSIS — Z981 Arthrodesis status: Secondary | ICD-10-CM

## 2018-03-17 MED ORDER — HYDROCODONE-ACETAMINOPHEN 5-325 MG PO TABS: ORAL_TABLET | ORAL | 0 refills | Status: DC

## 2018-03-17 NOTE — Progress Notes (Signed)
Office Visit Note   Patient: Laura Burnett           Date of Birth: Jun 08, 1966           MRN: [id number] Visit Date: 03/17/2018              Requested by: Guadalupe Dawn, MD [phone], [place] [address] PCP: Guadalupe Dawn, MD   Assessment & Plan: Visit Diagnoses:  1. S/P lumbar fusion     Plan: Patient has some erythema adjacent to the staples but no cellulitis.  Staples are harvested Steri-Strips applied.  Norco 5/325 number 40 tablets prescribed.  This should be her last prescription.  After that she can use some ibuprofen/Tylenol.  She will progress with her walking use the brace which she has at home.  Return 5 weeks.  No x-ray needed on return.  Follow-Up Instructions: No follow-ups on file.   Orders:  Orders Placed This Encounter  Procedures  . XR Lumbar Spine 2-3 Views   No orders of the defined types were placed in this encounter.     Procedures: No procedures performed   Clinical Data: No additional findings.   Subjective: Chief Complaint  Patient presents with  . Lower Back - Routine Post Op    HPI postop follow-up L5-S1 bilateral fusion with revision of loose pedicle screws.  Review of Systems unchanged from surgery.   Objective: Vital Signs: BP 101/70   Pulse (!) 110   Ht 5\' 2"  (1.575 m)   Wt 205 lb (93 kg)   BMI 37.49 kg/m   Physical Exam  Ortho Exam  Specialty Comments:  No specialty comments available.  Imaging: No results found.   PMFS History: Patient Active Problem List   Diagnosis Date Noted  . Lumbar pain 03/07/2018  . S/P lumbar fusion 02/07/2018  . Viral warts 12/16/2017  . Folliculitis 46/27/0350  . Cough 11/30/2017  . Acute bronchitis 11/21/2017  . Encounter to establish care 11/21/2017  . Lumbar back pain 10/22/2017  . Adjustment disorder with depressed mood 05/24/2017  . Hoarseness 04/21/2017  . Anxiety disorder 03/16/2017  . Lumbar radiculopathy 12/21/2016  . Neck pain 12/20/2015  .  Hypothyroidism    Past Medical History:  Diagnosis Date  . Anxiety   . Arthritis   . Asthma    as a child " exercise induced"  . Bronchitis   . Constipation   . Depression   . DJD (degenerative joint disease)   . Dyspnea    with exertion; chronic cough  . Fibromyalgia   . GERD (gastroesophageal reflux disease)   . Headache   . Hypothyroidism   . Obesity   . PONV (postoperative nausea and vomiting)   . Wears glasses     Family History  Problem Relation Age of Onset  . Thyroid disease Sister   . Thyroid disease Brother   . Alzheimer's disease Mother   . Diabetes Mother   . Heart attack Father   . Colon cancer Father   . Heart failure Father   . Cancer Brother   . Cancer Brother     Past Surgical History:  Procedure Laterality Date  . ABDOMINAL HYSTERECTOMY    . ADENOIDECTOMY    . APPLICATION OF ROBOTIC ASSISTANCE FOR SPINAL PROCEDURE N/A 12/21/2016   Procedure: APPLICATION OF ROBOTIC ASSISTANCE FOR SPINAL PROCEDURE;  Surgeon: Kevan Ny Ditty, MD;  Location: [facility];  Service: Neurosurgery;  Laterality: N/A;  . BACK SURGERY     L5-S1  .  CYST EXCISION     uterine ;subsequent sx followed  . FOOT SURGERY    . HAND SURGERY     from AA  . KNEE ARTHROSCOPY     x2  . TONSILLECTOMY     Social History   Occupational History  . Not on file  Tobacco Use  . Smoking status: Former Research scientist (life sciences)  . Smokeless tobacco: Never Used  . Tobacco comment: stopped smoking cigarettes at age 26  Substance and Sexual Activity  . Alcohol use: Yes    Comment: occasional  . Drug use: No    Comment:  per pt when she was 16 year  . Sexual activity: Not on file

## 2018-03-21 ENCOUNTER — Other Ambulatory Visit: Payer: Self-pay | Admitting: Family Medicine

## 2018-03-22 NOTE — Discharge Summary (Signed)
Patient ID: Laura Burnett MRN: [id number] DOB/AGE: 54-Nov-1967 54 y.o.  Admit date: 03/07/2018 Discharge date: 03/22/2018  Admission Diagnoses:  Active Problems:   S/P lumbar fusion   Lumbar pain   Discharge Diagnoses:  Active Problems:   S/P lumbar fusion   Lumbar pain  status post Procedure(s): L5-S1 PEDICLE SCREW REVISION, BILATERAL LATERAL FUSION, ILIAC CREST BONE GRAFT  Past Medical History:  Diagnosis Date  . Anxiety   . Arthritis   . Asthma    as a child " exercise induced"  . Bronchitis   . Constipation   . Depression   . DJD (degenerative joint disease)   . Dyspnea    with exertion; chronic cough  . Fibromyalgia   . GERD (gastroesophageal reflux disease)   . Headache   . Hypothyroidism   . Obesity   . PONV (postoperative nausea and vomiting)   . Wears glasses     Surgeries: Procedure(s): L5-S1 PEDICLE SCREW REVISION, BILATERAL LATERAL FUSION, ILIAC CREST BONE GRAFT on 03/07/2018   Consultants:   Discharged Condition: Improved  Hospital Course: Laura Burnett is an 52 y.o. female who was admitted 03/07/2018 for operative treatment of L5-S1 failed hardware from previous fusion. Patient failed conservative treatments (please see the history and physical for the specifics) and had severe unremitting pain that affects sleep, daily activities and work/hobbies. After pre-op clearance, the patient was taken to the operating room on 03/07/2018 and underwent  Procedure(s): L5-S1 PEDICLE SCREW REVISION, BILATERAL LATERAL FUSION, ILIAC CREST BONE GRAFT.    Patient was given perioperative antibiotics:  Anti-infectives (From admission, onward)   Start     Dose/Rate Route Frequency Ordered Stop   03/07/18 2200  ceFAZolin (ANCEF) IVPB 1 g/50 mL premix     1 g 100 mL/hr over 30 Minutes Intravenous Every 8 hours 03/07/18 1811 03/08/18 0721   03/07/18 1115  ceFAZolin (ANCEF) IVPB 2g/100 mL premix     2 g 200 mL/hr over 30 Minutes Intravenous On call to O.R. 03/07/18 1104  03/07/18 1415       Patient was given sequential compression devices and early ambulation to prevent DVT.   Patient benefited maximally from hospital stay and there were no complications. At the time of discharge, the patient was urinating/moving their bowels without difficulty, tolerating a regular diet, pain is controlled with oral pain medications and they have been cleared by PT/OT.   Recent vital signs: No data found.   Recent laboratory studies: No results for input(s): WBC, HGB, HCT, PLT, NA, K, CL, CO2, BUN, CREATININE, GLUCOSE, INR, CALCIUM in the last 72 hours.  Invalid input(s): PT, 2   Discharge Medications:   Allergies as of 03/08/2018      Reactions   Ciprofloxacin Nausea And Vomiting   Phentermine Other (See Comments)   Tearing, lightheadedness, depression   Tape Other (See Comments)   " I get red and itchy." Paper tape okay   Percocet [oxycodone-acetaminophen] Hives, Rash   Tetanus Toxoids Nausea And Vomiting      Medication List    STOP taking these medications   cyclobenzaprine 10 MG tablet Commonly known as:  FLEXERIL     TAKE these medications   albuterol 108 (90 Base) MCG/ACT inhaler Commonly known as:  PROVENTIL HFA;VENTOLIN HFA Inhale 2 puffs into the lungs every 6 (six) hours as needed for wheezing or shortness of breath.   ASPERCREME LIDOCAINE 4 % Liqd Generic drug:  Lidocaine HCl Apply 1 application topically 4 (four) times daily as  needed (for pain).   atorvastatin 10 MG tablet Commonly known as:  LIPITOR Take 10 mg by mouth at bedtime.   benzonatate 100 MG capsule Commonly known as:  TESSALON Take 2 capsules (200 mg total) by mouth 3 (three) times daily as needed for cough.   cetirizine 10 MG tablet Commonly known as:  ZYRTEC Take 1 tablet (10 mg total) by mouth daily.   diazepam 2 MG tablet Commonly known as:  VALIUM Take 1 tablet (2 mg total) by mouth daily.   DULoxetine 60 MG capsule Commonly known as:  CYMBALTA Take 2  capsules (120 mg total) by mouth daily. What changed:  when to take this   fluticasone 50 MCG/ACT nasal spray Commonly known as:  FLONASE Place 2 sprays into both nostrils daily.   gabapentin 800 MG tablet Commonly known as:  NEURONTIN Take 1 tablet (800 mg total) by mouth 3 (three) times daily.   HYDROcodone-acetaminophen 5-325 MG tablet Commonly known as:  NORCO/VICODIN Take 1-2 tablets by mouth every 4 (four) hours as needed for moderate pain.   ibuprofen 200 MG tablet Commonly known as:  ADVIL,MOTRIN Take 600 mg by mouth every 8 (eight) hours as needed for headache or moderate pain.   levothyroxine 100 MCG tablet Commonly known as:  SYNTHROID, LEVOTHROID Take 1 tablet (100 mcg total) by mouth daily.   lidocaine 5 % ointment Commonly known as:  XYLOCAINE Apply 1 application topically 4 (four) times daily as needed.   loratadine 10 MG tablet Commonly known as:  CLARITIN Take 10 mg by mouth daily.   ranitidine 150 MG tablet Commonly known as:  ZANTAC Take 150 mg by mouth 2 (two) times daily.   SALONPAS ARTHRITIS PAIN RELIEF EX Apply 1 application topically 4 (four) times daily as needed (for pain).   senna-docusate 8.6-50 MG tablet Commonly known as:  Senokot-S Take 1 tablet by mouth 2 (two) times daily.       Diagnostic Studies: Dg Lumbar Spine 2-3 Views  Result Date: 03/07/2018 CLINICAL DATA:  52 year old female post failed fusion L5-S1. Initial encounter. EXAM: DG C-ARM 61-120 MIN; LUMBAR SPINE - 2-3 VIEW Fluoroscopic time: 6 seconds. COMPARISON:  10/05/2017 CT. FINDINGS: Two intraoperative C-arm views submitted for review after surgery. Revision L5-S1 fusion. Evaluation limited. This can be assessed on follow-up. IMPRESSION: Revision L5-S1 fusion. Evaluation limited. This can be assessed on follow-up. Electronically Signed   By: Genia Del M.D.   On: 03/07/2018 16:31   Dg C-arm 1-60 Min  Result Date: 03/07/2018 CLINICAL DATA:  52 year old female post failed  fusion L5-S1. Initial encounter. EXAM: DG C-ARM 61-120 MIN; LUMBAR SPINE - 2-3 VIEW Fluoroscopic time: 6 seconds. COMPARISON:  10/05/2017 CT. FINDINGS: Two intraoperative C-arm views submitted for review after surgery. Revision L5-S1 fusion. Evaluation limited. This can be assessed on follow-up. IMPRESSION: Revision L5-S1 fusion. Evaluation limited. This can be assessed on follow-up. Electronically Signed   By: Genia Del M.D.   On: 03/07/2018 16:31   Xr Lumbar Spine 2-3 Views  Result Date: 03/17/2018 AP lateral lumbar spine x-rays are obtained and reviewed with spot lateral L5-S1.  There is new bone graft noted between transverse process L5 and sacral ala.  Larger pedicle screws noted.  Cages in same position. Impression: Post L5-S1 revision with bilateral lateral bone graft added.     Follow-up Information    [name], MD Follow up.   Specialty:  Orthopedic Surgery Contact information: [place] [address] [phone]  Discharge Plan:  discharge to home  Disposition:     Signed: Benjiman Core  03/22/2018, 3:34 PM

## 2018-03-24 ENCOUNTER — Encounter (INDEPENDENT_AMBULATORY_CARE_PROVIDER_SITE_OTHER): Payer: Self-pay | Admitting: Orthopaedic Surgery

## 2018-03-24 ENCOUNTER — Ambulatory Visit (INDEPENDENT_AMBULATORY_CARE_PROVIDER_SITE_OTHER): Payer: Self-pay | Admitting: Orthopaedic Surgery

## 2018-03-24 VITALS — BP 111/80 | HR 72 | Ht 62.0 in | Wt 205.0 lb

## 2018-03-24 DIAGNOSIS — Z981 Arthrodesis status: Secondary | ICD-10-CM

## 2018-03-24 NOTE — Progress Notes (Signed)
Post-Op Visit Note   Patient: Laura Burnett           Date of Birth: 1966/08/05           MRN: [id number] Visit Date: 03/24/2018 PCP: Guadalupe Dawn, MD   Assessment & Plan: Post pedicle screw revision with iliac crest bone graft.  Incision looks good.  She is using hydrocodone 5/325 for pain.  She is walking better has noticed some improvement in her symptoms since his surgery.  She still has some difficulty crossing her leg.  Chief Complaint:  Chief Complaint  Patient presents with  . Lower Back - Follow-up   Visit Diagnoses:  1. S/P lumbar fusion     Plan: Return 4 weeks for recheck.  Continue daily walking using her brace.  Follow-Up Instructions: No follow-ups on file.   Orders:  No orders of the defined types were placed in this encounter.  No orders of the defined types were placed in this encounter.   Imaging: No results found.  PMFS History: Patient Active Problem List   Diagnosis Date Noted  . Lumbar pain 03/07/2018  . S/P lumbar fusion 02/07/2018  . Viral warts 12/16/2017  . Folliculitis 37/05/6268  . Cough 11/30/2017  . Acute bronchitis 11/21/2017  . Encounter to establish care 11/21/2017  . Lumbar back pain 10/22/2017  . Adjustment disorder with depressed mood 05/24/2017  . Hoarseness 04/21/2017  . Anxiety disorder 03/16/2017  . Lumbar radiculopathy 12/21/2016  . Neck pain 12/20/2015  . Hypothyroidism    Past Medical History:  Diagnosis Date  . Anxiety   . Arthritis   . Asthma    as a child " exercise induced"  . Bronchitis   . Constipation   . Depression   . DJD (degenerative joint disease)   . Dyspnea    with exertion; chronic cough  . Fibromyalgia   . GERD (gastroesophageal reflux disease)   . Headache   . Hypothyroidism   . Obesity   . PONV (postoperative nausea and vomiting)   . Wears glasses     Family History  Problem Relation Age of Onset  . Thyroid disease Sister   . Thyroid disease Brother   . Alzheimer's disease  Mother   . Diabetes Mother   . Heart attack Father   . Colon cancer Father   . Heart failure Father   . Cancer Brother   . Cancer Brother     Past Surgical History:  Procedure Laterality Date  . ABDOMINAL HYSTERECTOMY    . ADENOIDECTOMY    . APPLICATION OF ROBOTIC ASSISTANCE FOR SPINAL PROCEDURE N/A 12/21/2016   Procedure: APPLICATION OF ROBOTIC ASSISTANCE FOR SPINAL PROCEDURE;  Surgeon: Kevan Ny Ditty, MD;  Location: [facility];  Service: Neurosurgery;  Laterality: N/A;  . BACK SURGERY     L5-S1  . CYST EXCISION     uterine ;subsequent sx followed  . FOOT SURGERY    . HAND SURGERY     from AA  . KNEE ARTHROSCOPY     x2  . TONSILLECTOMY     Social History   Occupational History  . Not on file  Tobacco Use  . Smoking status: Former Research scientist (life sciences)  . Smokeless tobacco: Never Used  . Tobacco comment: stopped smoking cigarettes at age 78  Substance and Sexual Activity  . Alcohol use: Yes    Comment: occasional  . Drug use: No    Comment:  per pt when she was 16 year  . Sexual activity: Not  on file
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
## 2018-04-04 ENCOUNTER — Ambulatory Visit (INDEPENDENT_AMBULATORY_CARE_PROVIDER_SITE_OTHER): Payer: Medicaid Other | Admitting: Psychiatry

## 2018-04-04 ENCOUNTER — Encounter (HOSPITAL_COMMUNITY): Payer: Self-pay | Admitting: Psychiatry

## 2018-04-04 VITALS — BP 114/88 | HR 114 | Ht 62.0 in | Wt 197.0 lb

## 2018-04-04 DIAGNOSIS — F321 Major depressive disorder, single episode, moderate: Secondary | ICD-10-CM | POA: Diagnosis not present

## 2018-04-04 MED ORDER — DIAZEPAM 2 MG PO TABS: 2.0000 mg | ORAL_TABLET | Freq: Every day | ORAL | 0 refills | Status: DC

## 2018-04-04 MED ORDER — DULOXETINE HCL 60 MG PO CPEP: 120.0000 mg | ORAL_CAPSULE | Freq: Every day | ORAL | 0 refills | Status: DC

## 2018-04-04 MED ORDER — NORTRIPTYLINE HCL 25 MG PO CAPS: 25.0000 mg | ORAL_CAPSULE | Freq: Every day | ORAL | 0 refills | Status: DC

## 2018-04-04 NOTE — Patient Instructions (Signed)
1. Continue duloxetine 120 mg daily  2. Start nortriptyline 25 mg at night  3. Continuevalium 2 mg dailyas needed for anxiety 4.Return to clinicin a month for 51mins

## 2018-04-06 ENCOUNTER — Encounter (HOSPITAL_COMMUNITY): Payer: Self-pay | Admitting: Licensed Clinical Social Worker

## 2018-04-06 ENCOUNTER — Ambulatory Visit (INDEPENDENT_AMBULATORY_CARE_PROVIDER_SITE_OTHER): Payer: Medicaid Other | Admitting: Licensed Clinical Social Worker

## 2018-04-06 DIAGNOSIS — F0631 Mood disorder due to known physiological condition with depressive features: Secondary | ICD-10-CM

## 2018-04-06 DIAGNOSIS — F419 Anxiety disorder, unspecified: Secondary | ICD-10-CM | POA: Diagnosis not present

## 2018-04-06 NOTE — Progress Notes (Signed)
   THERAPIST PROGRESS NOTE  Session Time: 10:00 am-10:45 am  Participation Level: Active  Behavioral Response: CasualAlertAnxious  Type of Therapy: Individual Therapy  Treatment Goals addressed: Coping  Interventions: CBT and Solution Focused  Summary: Laura Burnett is a 52 y.o. female who presents  oriented x5 (person, place, situation, time and object), alert, casually dressed, appropriately groomed, average height, overwieght,  and cooperative for an assessment on a referral from Dr. Modesta Messing and PCP. Patient has a history of medical treatment including hypothyroidism, back surgery, and fibromyalgia. Patient has minimal mental health treatment that includes medication management. Patient denies symptoms of mania. Patient denies suicidal and homicidal ideations. She denies psychosis including auditory and visual hallucinations. Patient denies substance abuse but admits to occasional alcohol use. Patient is at low risk for lethality at this time. Patient has been treated for Anxiety for several years. She is experiencing depressive symptoms that are connected to medical conditions (fibromyalsia, back pain, chronic pain).  Physically: Patient is recovering from her back surgery. She continues to have severe back pain and shoulder pain. Patient's fibromyalgia continues to be an issue for her.  Spiritually/Values: No issues identified.  Relationships: Patient has support from her son and her friends.  Emotional/Mental/Behavior: Patient feels overwhelmed. She feels like she is in a holding pattern and feels "stuck." She has reached out to churches, social services, friends and family for help with her bills. She is unable to pay her utilities and her mortgage is in forbearance until Oct. Patient has also contacted [employer] to help her with her disability hearing.   Patient engaged in session. Patient responded well to interventions. Patient continues to meet criteria for Mood disorder with  depressive features due to general medical condition and Anxiety disorder, unspecified type. Patient will continue in outpatient therapy due to being the least restrictive service to meet her needs. Patient made minimal progress on her goals at this time.    Suicidal/Homicidal: Negativewithout intent/plan  Therapist Response: Therapist reviewed patient's recent thoughts and behaviors. Therapist utilized CBT to address mood. Therapist processed patient's feelings to identify triggers for mood. Therapist assisted patient in identifying what is in her control.    Plan: Return again in 3-4 weeks.   Diagnosis: Axis I: Anxiety Disorder NOS and Mood disorder with depressive features due to general medical condition    Axis II: No diagnosis    Glori Bickers, LCSW 04/06/2018

## 2018-04-11 ENCOUNTER — Telehealth (INDEPENDENT_AMBULATORY_CARE_PROVIDER_SITE_OTHER): Payer: Self-pay | Admitting: Radiology

## 2018-04-11 NOTE — Telephone Encounter (Signed)
Please see message below and advise . Thanks

## 2018-04-11 NOTE — Telephone Encounter (Signed)
Hold for [employer].  Advised patient that I see that she was just started on another antidepressant do not want to add a narcotic at this point.  I see that she is also taking Flexeril.  I recommend that she absolutely be out of work to help prevent the increased pain that she is having.  Increase activity could also cause delayed healing of her fusion.  Follow-up with Dr. Lorin Mercy as scheduled.

## 2018-04-11 NOTE — Telephone Encounter (Signed)
Message from [place] at [place] office:  Laura Burnett, Laura Burnett 04/21/2066 called and left a message on the answering machine. She had a rough weekend, overdid it. Left radiculopathy. Wants to talk to you. I think she wants pain medicine. [phone]. Thanks.   I called and spoke with patient. She tried to go to the flea market this weekend to make some money to pay her bills. She states that she took a chair, but was up and down.  On Saturday night, pain where she had the iliac bone graft removed was severe. She took some flexeril but it is really bothering her.   Patient is out of pain medication. I explained that Dr. Lorin Mercy is out of the office and I will send this message to Benjiman Core, PA-C to advise. I did explain to patient that if [name] advised ok to refill hydrocodone, she would have to have someone pick up in [place] office. Patient will not have anyone that can do that.  She has taken Tylenol #3 with no problems previously.

## 2018-04-12 NOTE — Telephone Encounter (Signed)
I called slurring his words,  Recommended slow wean neurontin. Also on valium and flexeril, nortryriptylline,  Best to not add narcotic. She will work on walking more, use cane and ROV as scheduled.

## 2018-04-12 NOTE — Telephone Encounter (Signed)
Please see below messages and advise.

## 2018-04-21 ENCOUNTER — Encounter (INDEPENDENT_AMBULATORY_CARE_PROVIDER_SITE_OTHER): Payer: Self-pay | Admitting: Orthopaedic Surgery

## 2018-04-21 ENCOUNTER — Ambulatory Visit (INDEPENDENT_AMBULATORY_CARE_PROVIDER_SITE_OTHER): Payer: Self-pay | Admitting: Orthopaedic Surgery

## 2018-04-21 ENCOUNTER — Ambulatory Visit: Payer: Self-pay | Admitting: Endocrinology

## 2018-04-21 VITALS — BP 102/73 | HR 126 | Ht 62.0 in | Wt 198.0 lb

## 2018-04-21 DIAGNOSIS — Z981 Arthrodesis status: Secondary | ICD-10-CM

## 2018-04-21 NOTE — Progress Notes (Signed)
Post-Op Visit Note   Patient: Laura Burnett           Date of Birth: 1966-06-22           MRN: [id number] Visit Date: 04/21/2018 PCP: Guadalupe Dawn, MD   Assessment & Plan:  Chief Complaint:  Chief Complaint  Patient presents with  . Lower Back - Follow-up   Visit Diagnoses:  1. S/P lumbar fusion     Plan: Patient returns post L5-S1 bilateral lateral fusion with revision screws for pseudoarthrosis and loose screws from previous surgery 2018 done by Dr. Cyndy Freeze.  Her revision surgery date was 03/07/2018.  She is off her pain medication she has some pain at the bone graft site on the left.  She still has some numbness laterally on the lateral calf that radiates to the lateral foot and she understands this is something that will probably persist.  She has heel toe gait intact reflexes good lower extremity strength.  She has not put on a brace today and I told her she needs to put her brace on and keep it on to help support her back and increase her chance that the fusion will heal solidly.  I will recheck her in 2 months with repeat lumbar x-rays on return.  She can use Tylenol and Advil for pain.  She had an episode when she went to the flea market was doing a lot a standing sitting standing and sitting and did not have her brace on and then had significant increased pain that evening.  Follow-Up Instructions: Return in about 2 months (around 06/21/2018).   Orders:  No orders of the defined types were placed in this encounter.  No orders of the defined types were placed in this encounter.   Imaging: No results found.  PMFS History: Patient Active Problem List   Diagnosis Date Noted  . Lumbar pain 03/07/2018  . S/P lumbar fusion 02/07/2018  . Viral warts 12/16/2017  . Folliculitis 62/95/2841  . Cough 11/30/2017  . Acute bronchitis 11/21/2017  . Encounter to establish care 11/21/2017  . Lumbar back pain 10/22/2017  . Adjustment disorder with depressed mood 05/24/2017  .  Hoarseness 04/21/2017  . Anxiety disorder 03/16/2017  . Lumbar radiculopathy 12/21/2016  . Neck pain 12/20/2015  . Hypothyroidism    Past Medical History:  Diagnosis Date  . Anxiety   . Arthritis   . Asthma    as a child " exercise induced"  . Bronchitis   . Constipation   . Depression   . DJD (degenerative joint disease)   . Dyspnea    with exertion; chronic cough  . Fibromyalgia   . GERD (gastroesophageal reflux disease)   . Headache   . Hypothyroidism   . Obesity   . PONV (postoperative nausea and vomiting)   . Wears glasses     Family History  Problem Relation Age of Onset  . Thyroid disease Sister   . Thyroid disease Brother   . Alzheimer's disease Mother   . Diabetes Mother   . Heart attack Father   . Colon cancer Father   . Heart failure Father   . Cancer Brother   . Cancer Brother     Past Surgical History:  Procedure Laterality Date  . ABDOMINAL HYSTERECTOMY    . ADENOIDECTOMY    . APPLICATION OF ROBOTIC ASSISTANCE FOR SPINAL PROCEDURE N/A 12/21/2016   Procedure: APPLICATION OF ROBOTIC ASSISTANCE FOR SPINAL PROCEDURE;  Surgeon: Kevan Ny Ditty, MD;  Location: [facility]  OR;  Service: Neurosurgery;  Laterality: N/A;  . BACK SURGERY     L5-S1  . CYST EXCISION     uterine ;subsequent sx followed  . FOOT SURGERY    . HAND SURGERY     from AA  . KNEE ARTHROSCOPY     x2  . TONSILLECTOMY     Social History   Occupational History  . Not on file  Tobacco Use  . Smoking status: Former Research scientist (life sciences)  . Smokeless tobacco: Never Used  . Tobacco comment: stopped smoking cigarettes at age 75  Substance and Sexual Activity  . Alcohol use: Yes    Comment: occasional  . Drug use: No    Comment:  per pt when she was 16 year  . Sexual activity: Not on file

## 2018-04-25 ENCOUNTER — Encounter (HOSPITAL_COMMUNITY): Payer: Self-pay | Admitting: Licensed Clinical Social Worker

## 2018-04-25 ENCOUNTER — Ambulatory Visit (INDEPENDENT_AMBULATORY_CARE_PROVIDER_SITE_OTHER): Payer: Medicaid Other | Admitting: Licensed Clinical Social Worker

## 2018-04-25 DIAGNOSIS — F419 Anxiety disorder, unspecified: Secondary | ICD-10-CM

## 2018-04-25 DIAGNOSIS — F0631 Mood disorder due to known physiological condition with depressive features: Secondary | ICD-10-CM
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
## 2018-04-25 NOTE — Progress Notes (Signed)
   THERAPIST PROGRESS NOTE  Session Time: 1:00 pm-1:45 pm  Participation Level: Active  Behavioral Response: CasualAlertAnxious  Type of Therapy: Individual Therapy  Treatment Goals addressed: Coping  Interventions: CBT and Solution Focused  Summary: Laura Burnett is a 52 y.o. female who presents  oriented x5 (person, place, situation, time and object), alert, casually dressed, appropriately groomed, average height, overwieght,  and cooperative for an assessment on a referral from Dr. Modesta Messing and PCP. Patient has a history of medical treatment including hypothyroidism, back surgery, and fibromyalgia. Patient has minimal mental health treatment that includes medication management. Patient denies symptoms of mania. Patient denies suicidal and homicidal ideations. She denies psychosis including auditory and visual hallucinations. Patient denies substance abuse but admits to occasional alcohol use. Patient is at low risk for lethality at this time. Patient has been treated for Anxiety for several years. She is experiencing depressive symptoms that are connected to medical conditions (fibromyalsia, back pain, chronic pain).  Physically: Patient continues to struggle with chronic pain related to her back, legs, and nerves.  Spiritually/Values: No issues identified.  Relationships: Patient continues to get emotional and financial support from her family and friends.  Emotional/Mental/Behavior: Patient is frustrated waiting to hear from disability. Her mortgage is in forbearance until Oct and then she may be foreclosed on. She is getting behind on her utilities too. She can not physically work to earn money to pay her bills. 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 She is in survival mode.   Patient engaged in session. Patient responded well to interventions. Patient continues to meet criteria for Mood disorder with depressive features due to general medical condition and Anxiety disorder,  unspecified type. Patient will continue in outpatient therapy due to being the least restrictive service to meet her needs. Patient made minimal progress on her goals at this time.    Suicidal/Homicidal: Negativewithout intent/plan  Therapist Response: Therapist reviewed patient's recent thoughts and behaviors. Therapist utilized CBT to address mood. Therapist processed patient's feelings to identify triggers for mood. Therapist discussed acceptance with patient and controlling what she can control.     Plan: Return again in 3-4 weeks.   Diagnosis: Axis I: Anxiety Disorder NOS and Mood disorder with depressive features due to general medical condition    Axis II: No diagnosis    Glori Bickers, LCSW 04/25/2018

## 2018-04-28 ENCOUNTER — Encounter (HOSPITAL_COMMUNITY): Payer: Self-pay | Admitting: Psychiatry

## 2018-04-28 ENCOUNTER — Ambulatory Visit (INDEPENDENT_AMBULATORY_CARE_PROVIDER_SITE_OTHER): Payer: Medicaid Other | Admitting: Psychiatry

## 2018-04-28 VITALS — BP 112/81 | HR 112 | Ht 61.0 in | Wt 199.0 lb

## 2018-04-28 DIAGNOSIS — F321 Major depressive disorder, single episode, moderate: Secondary | ICD-10-CM | POA: Diagnosis not present

## 2018-04-28 MED ORDER — DIAZEPAM 2 MG PO TABS: 2.0000 mg | ORAL_TABLET | Freq: Every day | ORAL | 2 refills | Status: DC

## 2018-04-28 MED ORDER — NORTRIPTYLINE HCL 25 MG PO CAPS: 25.0000 mg | ORAL_CAPSULE | Freq: Every day | ORAL | 2 refills | Status: DC

## 2018-04-28 NOTE — Patient Instructions (Signed)
1. Continue duloxetine 120 mg daily  2. Continue nortriptyline 25 mg at night  3. Continuevalium 2 mg dailyas needed for anxiety 4.Return to clinicina month for 58mins

## 2018-04-30 ENCOUNTER — Other Ambulatory Visit: Payer: Self-pay | Admitting: Family Medicine

## 2018-05-14 ENCOUNTER — Other Ambulatory Visit: Payer: Self-pay | Admitting: Family Medicine

## 2018-05-16 ENCOUNTER — Encounter (HOSPITAL_COMMUNITY): Payer: Self-pay | Admitting: Licensed Clinical Social Worker

## 2018-05-16 ENCOUNTER — Ambulatory Visit (INDEPENDENT_AMBULATORY_CARE_PROVIDER_SITE_OTHER): Payer: Medicaid Other | Admitting: Licensed Clinical Social Worker

## 2018-05-16 DIAGNOSIS — F0631 Mood disorder due to known physiological condition with depressive features: Secondary | ICD-10-CM

## 2018-05-16 DIAGNOSIS — F419 Anxiety disorder, unspecified: Secondary | ICD-10-CM
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
## 2018-05-19 ENCOUNTER — Ambulatory Visit (HOSPITAL_COMMUNITY)
Admission: RE | Admit: 2018-05-19 | Discharge: 2018-05-19 | Disposition: A | Discharge status: Home / Self Care | Payer: Medicaid Other | Source: Ambulatory Visit | Attending: Endocrinology | Admitting: Endocrinology

## 2018-05-19 DIAGNOSIS — E039 Hypothyroidism, unspecified: Secondary | ICD-10-CM

## 2018-05-19 DIAGNOSIS — E010 Iodine-deficiency related diffuse (endemic) goiter: Secondary | ICD-10-CM | POA: Diagnosis not present

## 2018-05-25 ENCOUNTER — Other Ambulatory Visit: Payer: Self-pay

## 2018-05-25 MED ORDER — GABAPENTIN 800 MG PO TABS: 800.0000 mg | ORAL_TABLET | Freq: Three times a day (TID) | ORAL | 0 refills | Status: DC

## 2018-05-26 ENCOUNTER — Ambulatory Visit (INDEPENDENT_AMBULATORY_CARE_PROVIDER_SITE_OTHER): Payer: Self-pay | Admitting: Orthopaedic Surgery

## 2018-05-26 ENCOUNTER — Encounter (INDEPENDENT_AMBULATORY_CARE_PROVIDER_SITE_OTHER): Payer: Self-pay | Admitting: Orthopaedic Surgery

## 2018-05-26 ENCOUNTER — Ambulatory Visit (INDEPENDENT_AMBULATORY_CARE_PROVIDER_SITE_OTHER): Payer: Self-pay

## 2018-05-26 VITALS — BP 117/76 | HR 114 | Ht 62.0 in | Wt 198.0 lb

## 2018-05-26 DIAGNOSIS — Z981 Arthrodesis status: Secondary | ICD-10-CM

## 2018-05-26 DIAGNOSIS — M65311 Trigger thumb, right thumb: Secondary | ICD-10-CM | POA: Insufficient documentation

## 2018-05-26 MED ORDER — METHYLPREDNISOLONE ACETATE 40 MG/ML IJ SUSP
13.3300 mg | INTRAMUSCULAR | Status: AC | PRN
  Administered 2018-05-26: 13.33 mg

## 2018-05-26 MED ORDER — LIDOCAINE HCL 1 % IJ SOLN
0.3000 mL | INTRAMUSCULAR | Status: AC | PRN
  Administered 2018-05-26: .3 mL

## 2018-05-26 MED ORDER — BUPIVACAINE HCL 0.25 % IJ SOLN
0.3300 mL | INTRAMUSCULAR | Status: AC | PRN
  Administered 2018-05-26: .33 mL

## 2018-05-26 NOTE — Progress Notes (Signed)
Office Visit Note   Patient: Laura Burnett           Date of Birth: Aug 04, 1966           MRN: [id number] Visit Date: 05/26/2018              Requested by: Guadalupe Dawn, MD [phone], [place] [address] PCP: Guadalupe Dawn, MD   Assessment & Plan: Visit Diagnoses:  1. S/P lumbar fusion   2. Trigger thumb, right thumb     Plan: Continue walking.  Right trigger thumb injection performed.  She tolerated the injection well.  Return 2 months repeat x-rays with AP and spot lateral L5-S1 on return.  Follow-Up Instructions: Return in about 2 months (around 07/26/2018).   Orders:  Orders Placed This Encounter  Procedures  . XR Lumbar Spine 2-3 Views   No orders of the defined types were placed in this encounter.     Procedures: Hand/UE Inj: R thumb A1 for trigger finger on 05/26/2018 2:36 PM Medications: 0.3 mL lidocaine 1 %; 0.33 mL bupivacaine 0.25 %; 13.33 mg methylPREDNISolone acetate 40 MG/ML      Clinical Data: No additional findings.   Subjective: Chief Complaint  Patient presents with  . Lower Back - Routine Post Op    03/07/18  L5-S1 lateral fusion pedicle screw revision    HPI 68-month follow-up L5-S1 revision for surgery done by Dr. Cyndy Freeze.  Revision with larger screws and bilateral lateral bone graft.  Patient states she is feeling much better.  Still using her brace which tends to irritate her and sometimes rides up.  She is walking as instructed.  She states her thumb is triggering again despite using the brace she cannot flex it is tucked in extended position exquisite tenderness of the A1 pulley she is requesting injection.  Review of Systems change from surgery   Objective: Vital Signs: BP 117/76   Pulse (!) 114   Ht 5\' 2"  (1.575 m)   Wt 198 lb (89.8 kg)   BMI 36.21 kg/m   Physical Exam neurologically intact lumbar incision looks good.  Ortho Exam above.  Specialty Comments:  No specialty comments available.  Imaging: Xr  Lumbar Spine 2-3 Views  Result Date: 05/26/2018 Lateral lumbar spine x-rays demonstrate larger pedicle screws compared to 12/17/2017.  Lateral bone graft present twin intertransverse process without incorporation. Impression revision L5-S1 fusion.  Changes as described above.    PMFS History: Patient Active Problem List   Diagnosis Date Noted  . Trigger thumb, right thumb 05/26/2018  . Lumbar pain 03/07/2018  . S/P lumbar fusion 02/07/2018  . Viral warts 12/16/2017  . Folliculitis 95/05/3266  . Cough 11/30/2017  . Acute bronchitis 11/21/2017  . Encounter to establish care 11/21/2017  . Lumbar back pain 10/22/2017  . Adjustment disorder with depressed mood 05/24/2017  . Hoarseness 04/21/2017  . Anxiety disorder 03/16/2017  . Lumbar radiculopathy 12/21/2016  . Neck pain 12/20/2015  . Hypothyroidism    Past Medical History:  Diagnosis Date  . Anxiety   . Arthritis   . Asthma    as a child " exercise induced"  . Bronchitis   . Constipation   . Depression   . DJD (degenerative joint disease)   . Dyspnea    with exertion; chronic cough  . Fibromyalgia   . GERD (gastroesophageal reflux disease)   . Headache   . Hypothyroidism   . Obesity   . PONV (postoperative nausea and vomiting)   .  Wears glasses     Family History  Problem Relation Age of Onset  . Thyroid disease Sister   . Thyroid disease Brother   . Alzheimer's disease Mother   . Diabetes Mother   . Heart attack Father   . Colon cancer Father   . Heart failure Father   . Cancer Brother   . Cancer Brother     Past Surgical History:  Procedure Laterality Date  . ABDOMINAL HYSTERECTOMY    . ADENOIDECTOMY    . APPLICATION OF ROBOTIC ASSISTANCE FOR SPINAL PROCEDURE N/A 12/21/2016   Procedure: APPLICATION OF ROBOTIC ASSISTANCE FOR SPINAL PROCEDURE;  Surgeon: Kevan Ny Ditty, MD;  Location: [facility];  Service: Neurosurgery;  Laterality: N/A;  . BACK SURGERY     L5-S1  . CYST EXCISION     uterine ;subsequent  sx followed  . FOOT SURGERY    . HAND SURGERY     from AA  . KNEE ARTHROSCOPY     x2  . TONSILLECTOMY     Social History   Occupational History  . Not on file  Tobacco Use  . Smoking status: Former Research scientist (life sciences)  . Smokeless tobacco: Never Used  . Tobacco comment: stopped smoking cigarettes at age 31  Substance and Sexual Activity  . Alcohol use: Yes    Comment: occasional beer  . Drug use: No    Comment:  per pt when she was 16 year  . Sexual activity: Not Currently

## 2018-06-05 ENCOUNTER — Other Ambulatory Visit: Payer: Self-pay | Admitting: Family Medicine

## 2018-06-13 ENCOUNTER — Ambulatory Visit: Payer: Medicaid Other | Admitting: Family Medicine

## 2018-06-13 ENCOUNTER — Other Ambulatory Visit: Payer: Self-pay

## 2018-06-13 ENCOUNTER — Encounter: Payer: Self-pay | Admitting: Family Medicine

## 2018-06-13 ENCOUNTER — Telehealth: Payer: Self-pay | Admitting: *Deleted

## 2018-06-13 VITALS — BP 110/68 | HR 105 | Temp 97.4°F | Ht 62.0 in | Wt 202.0 lb

## 2018-06-13 DIAGNOSIS — R05 Cough: Secondary | ICD-10-CM

## 2018-06-13 DIAGNOSIS — J189 Pneumonia, unspecified organism: Secondary | ICD-10-CM | POA: Diagnosis present

## 2018-06-13 DIAGNOSIS — R059 Cough, unspecified: Secondary | ICD-10-CM

## 2018-06-13 MED ORDER — GUAIFENESIN-DM 100-10 MG/5ML PO SYRP: 5.0000 mL | ORAL_SOLUTION | ORAL | 0 refills | Status: DC | PRN

## 2018-06-13 MED ORDER — CETIRIZINE HCL 10 MG PO TABS: 10.0000 mg | ORAL_TABLET | Freq: Every day | ORAL | 5 refills | Status: DC

## 2018-06-13 MED ORDER — ATORVASTATIN CALCIUM 10 MG PO TABS: 10.0000 mg | ORAL_TABLET | Freq: Every day | ORAL | 0 refills | Status: DC

## 2018-06-13 MED ORDER — AZITHROMYCIN 250 MG PO TABS: ORAL_TABLET | ORAL | 0 refills | Status: DC

## 2018-06-13 NOTE — Patient Instructions (Signed)
It was great seeing you again today!  I am sorry but has a hard time with this cough congestion and other symptoms for the last 2 weeks.  I think your symptoms may be more consistent with an atypical pneumonia, but we did discuss the possibilities of a viral pneumonia, allergies, or another bacterial pneumonia.  For this I would like to give you a prescription strength cough suppressant, a Z-Pak to cover the atypical bacteria, and he can pick up some Cepacol lozenges from the pharmacy.  I will be followed on the right neurosurgeon for your ongoing back care.  Please let me know if you have any questions or concerns.

## 2018-06-13 NOTE — Telephone Encounter (Signed)
Pt calls because she was under the impression that he was going to give her the cough med with codeine in it to help her rest.   Confirmed with Dr. Kris Mouton that right medication was given.  Returned call to pt and explained that the right med was given and the cough suppressant should help with getting rest. She states that the one with codeine was only $3.  She has not checked the price of med Rx'd.  She will check price and give [place] a call if there are issues.  , Salome Spotted, CMA

## 2018-06-14 ENCOUNTER — Encounter: Payer: Self-pay | Admitting: Family Medicine

## 2018-06-14 DIAGNOSIS — J189 Pneumonia, unspecified organism: Secondary | ICD-10-CM | POA: Insufficient documentation

## 2018-06-14 NOTE — Progress Notes (Signed)
   HPI 52 year old female who presents with cough, congestion, sore throat. She states these symptoms first started around 2 weeks ago. Initially the cough was productive, but has since become dry and has settled "lower in her chest". She initially had some mild congestion with rhinorrhea and sinus pressure. The sinus pressure has remained but she is not longer having the rhinorrhea. She has also had a very mild sore throat, but feels that cepacol lozenges have been helpful.  CC: cough, congestion   ROS:   Review of Systems See HPI for ROS.   CC, SH/smoking status, and VS noted  Objective: BP 110/68   Pulse (!) 105   Temp (!) 97.4 F (36.3 C) (Oral)   Ht 5\' 2"  (1.575 m)   Wt 202 lb (91.6 kg)   SpO2 95%   BMI 36.95 kg/m  Gen: 52 year old caucasian female in no acute distress, resting comfortably HEENT: NCAT, EOMI, PERRL. No oropharyngeal erythema CV: RRR, no murmur Resp: CTAB, no wheezes, non-labored Abd: SNTND, BS present, no guarding or organomegaly Ext: No edema, warm Neuro: Alert and oriented, Speech clear, No gross deficits   Assessment and plan:  Atypical pneumonia Given duration of symptoms, lack of improvement, and non-productive cough along with a clear lung exam her symptoms are suspicious for an atypical pneumonia. Other items on differential are a viral pneumonia vs post-infectious cough. Will treat with azithromycin due to clinical suspicion. - zpak, 5 day course with 500mg  loading dose - cough suppressant with OTC meds - cepacol lozenges for sore throat - RTC prn   No orders of the defined types were placed in this encounter.   Meds ordered this encounter  Medications  . azithromycin (ZITHROMAX Z-PAK) 250 MG tablet    Sig: Please take 2 tabs the first day and 1 tab in each subsequent day    Dispense:  6 each    Refill:  0  . guaiFENesin-dextromethorphan (ROBITUSSIN DM) 100-10 MG/5ML syrup    Sig: Take 5 mLs by mouth every 4 (four) hours as needed for  cough.    Dispense:  118 mL    Refill:  0  . cetirizine (ZYRTEC) 10 MG tablet    Sig: Take 1 tablet (10 mg total) by mouth daily.    Dispense:  30 tablet    Refill:  5  . atorvastatin (LIPITOR) 10 MG tablet    Sig: Take 1 tablet (10 mg total) by mouth at bedtime.    Dispense:  90 tablet    Refill:  0     Guadalupe Dawn MD PGY-2 Family Medicine Resident  06/14/2018 9:43 AM

## 2018-06-14 NOTE — Assessment & Plan Note (Signed)
Given duration of symptoms, lack of improvement, and non-productive cough along with a clear lung exam her symptoms are suspicious for an atypical pneumonia. Other items on differential are a viral pneumonia vs post-infectious cough. Will treat with azithromycin due to clinical suspicion. - zpak, 5 day course with 500mg  loading dose - cough suppressant with OTC meds - cepacol lozenges for sore throat - RTC prn

## 2018-06-16 ENCOUNTER — Telehealth: Payer: Self-pay

## 2018-06-16 MED ORDER — GUAIFENESIN-CODEINE 100-10 MG/5ML PO SOLN: 5.0000 mL | Freq: Three times a day (TID) | ORAL | 0 refills | Status: DC | PRN

## 2018-06-16 NOTE — Telephone Encounter (Signed)
Patient left message again asking for cough medicine with codeine. The one she has is not helping her and she is on her last day of abx but still coughing, unable to rest.  Also stated her AVS says she would be getting prescription cough medicine.  Call back is [phone]  Danley Danker, RN [facility] [place])

## 2018-06-16 NOTE — Telephone Encounter (Signed)
Sent in script for cough medicine contain codeine to her pharmacy. Please let the patient know this has been sent in.  Guadalupe Dawn MD PGY-2 Family Medicine Resident

## 2018-06-17 NOTE — Telephone Encounter (Signed)
Pt informed.  , CMA  

## 2018-06-27 ENCOUNTER — Other Ambulatory Visit: Payer: Self-pay

## 2018-06-27 MED ORDER — GABAPENTIN 800 MG PO TABS: 800.0000 mg | ORAL_TABLET | Freq: Three times a day (TID) | ORAL | 2 refills | Status: DC

## 2018-06-29 ENCOUNTER — Other Ambulatory Visit: Payer: Self-pay

## 2018-06-29 MED ORDER — GABAPENTIN 800 MG PO TABS: 800.0000 mg | ORAL_TABLET | Freq: Three times a day (TID) | ORAL | 2 refills | Status: DC

## 2018-06-29 NOTE — Telephone Encounter (Signed)
Re-sent Rx for Gabapentin 800 mg. Spoke to pharmacist and they did not receive.  Danley Danker, RN [facility] RN)

## 2018-07-05 ENCOUNTER — Ambulatory Visit (HOSPITAL_COMMUNITY): Payer: Medicaid Other | Admitting: Licensed Clinical Social Worker

## 2018-07-05 ENCOUNTER — Telehealth: Payer: Self-pay | Admitting: Family Medicine

## 2018-07-05 NOTE — Telephone Encounter (Signed)
Spoke with pt reminding them/confirming their appt for tomorrow 07/06/2018. Pt mentioned they were feeling worse and tried for an appt for today, but nothing was available. Kept their appt in the system with their approval and I recommended they go to the urgent care clinic today if they truly cannot wait until tomorrow. -[place]

## 2018-07-06 ENCOUNTER — Ambulatory Visit
Admission: RE | Admit: 2018-07-06 | Discharge: 2018-07-06 | Disposition: A | Discharge status: Home / Self Care | Payer: Medicaid Other | Source: Ambulatory Visit | Attending: Family Medicine | Admitting: Family Medicine

## 2018-07-06 ENCOUNTER — Ambulatory Visit (INDEPENDENT_AMBULATORY_CARE_PROVIDER_SITE_OTHER): Payer: Medicaid Other | Admitting: Family Medicine

## 2018-07-06 ENCOUNTER — Other Ambulatory Visit: Payer: Self-pay

## 2018-07-06 ENCOUNTER — Encounter: Payer: Self-pay | Admitting: Family Medicine

## 2018-07-06 VITALS — BP 98/72 | HR 96 | Temp 97.9°F | Ht 62.0 in | Wt 201.8 lb

## 2018-07-06 DIAGNOSIS — Z23 Encounter for immunization: Secondary | ICD-10-CM | POA: Diagnosis not present

## 2018-07-06 DIAGNOSIS — R05 Cough: Secondary | ICD-10-CM | POA: Diagnosis not present

## 2018-07-06 DIAGNOSIS — R059 Cough, unspecified: Secondary | ICD-10-CM

## 2018-07-06 MED ORDER — PREDNISONE 50 MG PO TABS: ORAL_TABLET | ORAL | 0 refills | Status: DC

## 2018-07-06 MED ORDER — ACETAMINOPHEN-CODEINE 300-30 MG PO TABS: 1.0000 | ORAL_TABLET | Freq: Four times a day (QID) | ORAL | 0 refills | Status: AC | PRN

## 2018-07-06 NOTE — Progress Notes (Signed)
    CHIEF COMPLAINT / HPI: Continued mildly productive cough.  No blood in her sputum.  She is having episodes of prolonged coughing works difficult to stop.  Feels like her chest is still somewhat tight.  Has never been diagnosed with wheezing or bronchospasm although she did have a diagnosis of exercise-induced asthma as a teenager. No fever the last 48 hours although she feels unwell.  She does have some generalized myalgias.  REVIEW OF SYSTEMS: See HPI.   PERTINENT  PMH / PSH: I have reviewed the patient's medications, allergies, past medical and surgical history, smoking status and updated in the EMR as appropriate.   OBJECTIVE: Vital signs reviewed. GENERAL: Well-developed, well-nourished, no acute distress. CARDIOVASCULAR: Regular rate and rhythm no murmur gallop or rub LUNGS: Expiratory wheeze heard posterior left upper lung.  Otherwise no abnormal sounds.  Air sounds are heard in all lung fields. ABDOMEN: Soft positive bowel sounds NEURO: No gross focal neurological deficits. MSK: Movement of extremity x 4.    ASSESSMENT / PLAN:  No problem-specific Assessment & Plan notes found for this encounter.

## 2018-07-06 NOTE — Patient Instructions (Signed)
I have placed the order for your chest x-ray and I have sent in your prescriptions for prednisone and Tylenol 3.  You will take the prednisone once daily for 7 days.

## 2018-07-07 ENCOUNTER — Encounter: Payer: Self-pay | Admitting: Family Medicine

## 2018-07-07 NOTE — Assessment & Plan Note (Signed)
Do not think she needs another round of antibiotics as I suspect this is mostly viral.  I will get a chest x-ray as she is quite concerned.  Also I did hear some mild expiratory wheezing.  We will start her on steroid burst.  If not improving or if she has worsening symptoms, she will call.

## 2018-07-13 ENCOUNTER — Telehealth: Payer: Self-pay

## 2018-07-13 NOTE — Telephone Encounter (Signed)
Patient calling for xray results from 11/6 office visit.  Call back is [phone]  Danley Danker, RN [facility])

## 2018-07-15 NOTE — Telephone Encounter (Signed)
Patient calling again about CXR results.  Call back is [phone]  Danley Danker, RN [facility] [place])

## 2018-07-15 NOTE — Telephone Encounter (Signed)
Spoke w patient. She had evidently not received the letter I sent. Still having cough. I reviewed the results of CXR (no pneumonia) and told her to make appt w her PCP to discuss continued cough. They may want to consider pulmonary referral, PFT, etc Dorcas Mcmurray

## 2018-07-19 ENCOUNTER — Other Ambulatory Visit: Payer: Self-pay | Admitting: Family Medicine
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
## 2018-07-21 ENCOUNTER — Ambulatory Visit (INDEPENDENT_AMBULATORY_CARE_PROVIDER_SITE_OTHER): Payer: Self-pay

## 2018-07-21 ENCOUNTER — Encounter (INDEPENDENT_AMBULATORY_CARE_PROVIDER_SITE_OTHER): Payer: Self-pay | Admitting: Orthopaedic Surgery

## 2018-07-21 ENCOUNTER — Ambulatory Visit (INDEPENDENT_AMBULATORY_CARE_PROVIDER_SITE_OTHER): Payer: Medicaid Other | Admitting: Orthopaedic Surgery

## 2018-07-21 VITALS — BP 121/75 | HR 121 | Ht 61.0 in | Wt 200.0 lb

## 2018-07-21 DIAGNOSIS — Z981 Arthrodesis status: Secondary | ICD-10-CM

## 2018-07-21 NOTE — Progress Notes (Signed)
Office Visit Note   Patient: Laura Burnett           Date of Birth: [birth year]           MRN: [id number] Visit Date: 07/21/2018              Requested by: Guadalupe Dawn, MD [phone], [place] [address] PCP: Guadalupe Dawn, MD   Assessment & Plan: Visit Diagnoses:  1. S/P lumbar fusion     Plan: Post revision surgery for pseudoarthrosis lumbar.  She is happy with the surgical result.  Good resolution of trigger thumb symptoms with the injection in September.  I will release her from care check her back again on a as needed basis.  I have encouraged her to work on a walking program, weight loss.  Follow-Up Instructions: Return if symptoms worsen or fail to improve.   Orders:  Orders Placed This Encounter  Procedures  . XR Lumbar Spine 2-3 Views   No orders of the defined types were placed in this encounter.     Procedures: No procedures performed   Clinical Data: No additional findings.   Subjective: Chief Complaint  Patient presents with  . Lower Back - Follow-up    03/07/18 L5-S1 lateral fusion pedicle screw revision    HPI 52 year old female returns post 03/17/2018 lumbar fusion revision due to screw loosening.  Original surgery was done by another surgeon.  She is walking better states her back is doing better.  She has had some recent treatment for cough and also may have had some gallbladder issues.  Right trigger thumb injection 05/26/2018 gave her good relief of triggering of her thumb is asymptomatic.  She is moving better and has had improvement in her pain.  Review of Systems updated unchanged since July surgery other than as mentioned in HPI.   Objective: Vital Signs: BP 121/75   Pulse (!) 121   Ht 5\' 1"  (1.549 m)   Wt 200 lb (90.7 kg)   BMI 37.79 kg/m   Physical Exam  Constitutional: She is oriented to person, place, and time. She appears well-developed.  HENT:  Head: Normocephalic.  Right Ear: External ear normal.  Left Ear:  External ear normal.  Eyes: Pupils are equal, round, and reactive to light.  Neck: No tracheal deviation present. No thyromegaly present.  Cardiovascular: Normal rate.  Pulmonary/Chest: Effort normal.  Abdominal: Soft.  Neurological: She is alert and oriented to person, place, and time.  Skin: Skin is warm and dry.  Psychiatric: She has a normal mood and affect. Her behavior is normal.    Ortho Exam is able to ambulate heel and toe walk lumbar incision well-healed.  No triggering of the thumb.  Elbow reaches full extension.  Thyroid is normal palpation.  Good flexion-extension cervical spine.  Specialty Comments:  No specialty comments available.  Imaging: No results found.   PMFS History: Patient Active Problem List   Diagnosis Date Noted  . Atypical pneumonia 06/14/2018  . Trigger thumb, right thumb 05/26/2018  . Lumbar pain 03/07/2018  . S/P lumbar fusion 02/07/2018  . Viral warts 12/16/2017  . Folliculitis 24/05/7352  . Cough 11/30/2017  . Acute bronchitis 11/21/2017  . Encounter to establish care 11/21/2017  . Lumbar back pain 10/22/2017  . Adjustment disorder with depressed mood 05/24/2017  . Hoarseness 04/21/2017  . Anxiety disorder 03/16/2017  . Lumbar radiculopathy 12/21/2016  . Neck pain 12/20/2015  . Hypothyroidism    Past Medical History:  Diagnosis  Date  . Anxiety   . Arthritis   . Asthma    as a child " exercise induced"  . Bronchitis   . Constipation   . Depression   . DJD (degenerative joint disease)   . Dyspnea    with exertion; chronic cough  . Fibromyalgia   . GERD (gastroesophageal reflux disease)   . Headache   . Hypothyroidism   . Obesity   . PONV (postoperative nausea and vomiting)   . Wears glasses     Family History  Problem Relation Age of Onset  . Thyroid disease Sister   . Thyroid disease Brother   . Alzheimer's disease Mother   . Diabetes Mother   . Heart attack Father   . Colon cancer Father   . Heart failure Father     . Cancer Brother   . Cancer Brother     Past Surgical History:  Procedure Laterality Date  . ABDOMINAL HYSTERECTOMY    . ADENOIDECTOMY    . APPLICATION OF ROBOTIC ASSISTANCE FOR SPINAL PROCEDURE N/A 12/21/2016   Procedure: APPLICATION OF ROBOTIC ASSISTANCE FOR SPINAL PROCEDURE;  Surgeon: Kevan Ny Ditty, MD;  Location: [facility];  Service: Neurosurgery;  Laterality: N/A;  . BACK SURGERY     L5-S1  . CYST EXCISION     uterine ;subsequent sx followed  . FOOT SURGERY    . HAND SURGERY     from AA  . KNEE ARTHROSCOPY     x2  . TONSILLECTOMY     Social History   Occupational History  . Not on file  Tobacco Use  . Smoking status: Former Research scientist (life sciences)  . Smokeless tobacco: Never Used  . Tobacco comment: stopped smoking cigarettes at age 89  Substance and Sexual Activity  . Alcohol use: Yes    Comment: occasional beer  . Drug use: No    Comment:  per pt when she was 16 year  . Sexual activity: Not Currently

## 2018-07-25 ENCOUNTER — Ambulatory Visit (HOSPITAL_COMMUNITY): Payer: Self-pay | Admitting: Psychiatry

## 2018-08-05 ENCOUNTER — Encounter (HOSPITAL_COMMUNITY): Payer: Self-pay | Admitting: Psychiatry

## 2018-08-05 NOTE — Telephone Encounter (Signed)
This encounter was created in error - please disregard.

## 2018-08-08 ENCOUNTER — Other Ambulatory Visit (HOSPITAL_COMMUNITY): Payer: Self-pay | Admitting: Psychiatry

## 2018-08-08 MED ORDER — NORTRIPTYLINE HCL 25 MG PO CAPS: 25.0000 mg | ORAL_CAPSULE | Freq: Every day | ORAL | 1 refills | Status: DC

## 2018-08-25 ENCOUNTER — Other Ambulatory Visit: Payer: Self-pay | Admitting: Family Medicine

## 2018-08-29 ENCOUNTER — Telehealth (HOSPITAL_COMMUNITY): Payer: Self-pay | Admitting: Psychiatry

## 2018-08-29 MED ORDER — DIAZEPAM 2 MG PO TABS: 2.0000 mg | ORAL_TABLET | Freq: Every day | ORAL | 0 refills | Status: DC

## 2018-08-29 NOTE — Telephone Encounter (Signed)
Ordered refill for valium per request. Please contact the patient to make follow up appointment in January. I will not be able to do any more refills.

## 2018-08-29 NOTE — Telephone Encounter (Signed)
Spoke with patient & she just received her Medicaid & had questions about [employer] She will call back after speaking with her case worker with her questions

## 2018-09-07 ENCOUNTER — Ambulatory Visit (HOSPITAL_COMMUNITY): Payer: Medicaid Other | Admitting: Licensed Clinical Social Worker

## 2018-09-09 ENCOUNTER — Other Ambulatory Visit: Payer: Self-pay | Admitting: Family Medicine

## 2018-09-09 MED ORDER — ATORVASTATIN CALCIUM 10 MG PO TABS: 10.0000 mg | ORAL_TABLET | Freq: Every day | ORAL | 0 refills | Status: DC

## 2018-09-09 MED ORDER — LEVOTHYROXINE SODIUM 100 MCG PO TABS: 100.0000 ug | ORAL_TABLET | Freq: Every day | ORAL | 3 refills | Status: DC

## 2018-09-09 NOTE — Telephone Encounter (Signed)
Pt called and wants to know if you could send a refill for her prescription to her pharmacyfor her thyroid and cholesterol medication. ad

## 2018-09-17 ENCOUNTER — Other Ambulatory Visit: Payer: Self-pay | Admitting: Family Medicine

## 2018-09-30 ENCOUNTER — Other Ambulatory Visit: Payer: Self-pay

## 2018-09-30 ENCOUNTER — Encounter: Payer: Self-pay | Admitting: Family Medicine

## 2018-09-30 ENCOUNTER — Ambulatory Visit (INDEPENDENT_AMBULATORY_CARE_PROVIDER_SITE_OTHER): Payer: Medicaid Other | Admitting: Family Medicine

## 2018-09-30 VITALS — BP 92/64 | HR 113 | Temp 98.7°F | Ht 62.0 in | Wt 208.0 lb

## 2018-09-30 DIAGNOSIS — R490 Dysphonia: Secondary | ICD-10-CM

## 2018-09-30 DIAGNOSIS — E039 Hypothyroidism, unspecified: Secondary | ICD-10-CM | POA: Diagnosis present

## 2018-09-30 NOTE — Progress Notes (Signed)
   HPI 53 year old Caucasian female who presents for thyroid check and lipid panel.  Thyroid stimulating hormone last checked 01/17/2018 and was 1.12.  T4 also checked and was 0.87.  This checked by her endocrinologist.  Unfortunately the patient seems to have developed in trying relationship with her endocrinologist and states that she does not want you back there.  Patient did have ultrasound of her thyroid back in 05/19/2018.  The study showed thyromegaly with heterogenous parenchyma.  Patient states that since that time she has been able to palpate various parts of her thyroid.  She is concerned that she has a new nodule or cystic structure.  In regards her lipid panel, this was last done in 10/2017.  Advised patient that her insurance would likely not cover this check as it is not been a year.  Patient voiced understanding and no longer wants test done at this time.  Patient has had hoarseness and difficulty swallowing, a referral was placed by her endocrinologist at last appointment to ENT.  She has not heard back from ENT and is desiring to have the referral replaced.  Given that she is no longer going to her endocrinologist, she hopes that I can place this referral.   CC: Thyroid and hoarseness   ROS:   Review of Systems See HPI for ROS.   CC, SH/smoking status, and VS noted  Objective: BP 92/64   Pulse (!) 113   Temp 98.7 F (37.1 C) (Oral)   Ht 5\' 2"  (1.575 m)   Wt 208 lb (94.3 kg)   SpO2 98%   BMI 38.04 kg/m  Gen: Well-appearing 53 year old Caucasian female, resting comfortably, no acute distress HEENT: Palpable nodular structure at all point in median lower neck.  No discrete cystic structures palpated. CV: RRR, no murmur Resp: CTAB, no wheezes, non-labored Abd: SNTND, BS present, no guarding or organomegaly Neuro: Alert and oriented, Speech clear, No gross deficits   Assessment and plan:  Hypothyroidism Will check TSH, T4.  If abnormal will adjust Synthroid as  necessary.  Given normal ultrasound only 4 months ago, likely will not need radionucleotide uptake.  Hoarseness Patient with hoarseness secondary to unknown cause.  Has already had ENT referral placed by endocrinology.  Patient has not heard anything and wishes for this to be replaced.  Given that she is on no longer seeing endocrinology, will place this referral.   Orders Placed This Encounter  Procedures  . TSH  . T4, Free  . Ambulatory referral to ENT    Referral Priority:   Routine    Referral Type:   Consultation    Referral Reason:   Specialty Services Required    Requested Specialty:   Otolaryngology    Number of Visits Requested:   1    No orders of the defined types were placed in this encounter.    Guadalupe Dawn MD PGY-2 Family Medicine Resident  09/30/2018 3:26 PM

## 2018-09-30 NOTE — Patient Instructions (Signed)
It was great seeing you today!  I am sorry been having so much trouble with your hoarseness and inability to swallow.  For this I will replace your ENT referral, that was originally placed by your endocrinologist.  In regards to your thyroid bumps.  I really think it is just your entire thyroid is enlarged secondary to low thyroid level.  We will check a thyroid-stimulating hormone and thyroid level today.  I will call you with these results.  In regards to cholesterol panel, unfortunately insurance will only pay for this 1 time per year.  I do not believe they will cover a check this soon, please come back in 2 months and we will do the cholesterol test.  Regards to your calf pain, I think it is likely just a musculoskeletal problem.  Whether that be a muscle spasm, tendinitis or another problem it is hard to say.  Most usually it could be would be a DVT.  I effectively rule this out as your calf size is the same on both sides.

## 2018-09-30 NOTE — Assessment & Plan Note (Signed)
Will check TSH, T4.  If abnormal will adjust Synthroid as necessary.  Given normal ultrasound only 4 months ago, likely will not need radionucleotide uptake.

## 2018-09-30 NOTE — Assessment & Plan Note (Signed)
Patient with hoarseness secondary to unknown cause.  Has already had ENT referral placed by endocrinology.  Patient has not heard anything and wishes for this to be replaced.  Given that she is on no longer seeing endocrinology, will place this referral.

## 2018-10-01 LAB — T4, FREE: Free T4: 1.44 ng/dL (ref 0.82–1.77)

## 2018-10-01 LAB — TSH: TSH: 3.92 u[IU]/mL (ref 0.450–4.500)

## 2018-10-04 ENCOUNTER — Other Ambulatory Visit: Payer: Self-pay | Admitting: Family Medicine

## 2018-10-04 MED ORDER — NORTRIPTYLINE HCL 25 MG PO CAPS: 25.0000 mg | ORAL_CAPSULE | Freq: Every day | ORAL | 0 refills | Status: DC

## 2018-10-04 NOTE — Telephone Encounter (Signed)
Please let the patient know that her TSH and T4 were both within normal limits. No further adjustment to medications is necessary. No further workup is necessary as well.  Guadalupe Dawn MD PGY-2 Family Medicine Resident

## 2018-10-04 NOTE — Telephone Encounter (Signed)
Pt called nurse line requesting a refill on Pamelor and requesting her lab results. I informed pt her TSH and T4 were WNL and no adjustments necessary to her medications.

## 2018-10-04 NOTE — Addendum Note (Signed)
Addended by: Dorna Bloom on: 10/04/2018 02:19 PM   Modules accepted: Orders

## 2018-10-06 ENCOUNTER — Other Ambulatory Visit: Payer: Self-pay | Admitting: Family Medicine

## 2018-10-18 DIAGNOSIS — E010 Iodine-deficiency related diffuse (endemic) goiter: Secondary | ICD-10-CM | POA: Insufficient documentation

## 2018-10-21 ENCOUNTER — Other Ambulatory Visit: Payer: Self-pay | Admitting: Otolaryngology

## 2018-10-21 DIAGNOSIS — R221 Localized swelling, mass and lump, neck: Secondary | ICD-10-CM

## 2018-10-21 DIAGNOSIS — R490 Dysphonia: Secondary | ICD-10-CM

## 2018-10-21 DIAGNOSIS — E010 Iodine-deficiency related diffuse (endemic) goiter: Secondary | ICD-10-CM

## 2018-11-04 ENCOUNTER — Other Ambulatory Visit: Payer: Self-pay

## 2018-11-04 ENCOUNTER — Ambulatory Visit
Admission: RE | Admit: 2018-11-04 | Discharge: 2018-11-04 | Disposition: A | Discharge status: Home / Self Care | Payer: Medicaid Other | Source: Ambulatory Visit | Attending: Otolaryngology | Admitting: Otolaryngology

## 2018-11-04 DIAGNOSIS — E010 Iodine-deficiency related diffuse (endemic) goiter: Secondary | ICD-10-CM

## 2018-11-04 DIAGNOSIS — R490 Dysphonia: Secondary | ICD-10-CM

## 2018-11-04 DIAGNOSIS — R221 Localized swelling, mass and lump, neck: Secondary | ICD-10-CM

## 2018-11-04 MED ORDER — IOPAMIDOL (ISOVUE-300) INJECTION 61%
75.0000 mL | Freq: Once | INTRAVENOUS | Status: AC | PRN
  Administered 2018-11-04: 75 mL via INTRAVENOUS

## 2018-11-04 MED ORDER — CYCLOBENZAPRINE HCL 10 MG PO TABS: 10.0000 mg | ORAL_TABLET | Freq: Three times a day (TID) | ORAL | 0 refills | Status: DC | PRN

## 2018-11-09 ENCOUNTER — Other Ambulatory Visit: Payer: Self-pay | Admitting: *Deleted

## 2018-11-09 MED ORDER — DULOXETINE HCL 60 MG PO CPEP: 120.0000 mg | ORAL_CAPSULE | Freq: Every day | ORAL | 0 refills | Status: DC

## 2018-11-09 NOTE — Telephone Encounter (Signed)
Pt is out of meds. [place], Laura Burnett, CMA

## 2018-11-30 ENCOUNTER — Other Ambulatory Visit: Payer: Self-pay | Admitting: Family Medicine

## 2018-12-02 ENCOUNTER — Telehealth: Payer: Self-pay

## 2018-12-02 ENCOUNTER — Other Ambulatory Visit: Payer: Self-pay | Admitting: Family Medicine

## 2018-12-02 MED ORDER — DIAZEPAM 2 MG PO TABS: 2.0000 mg | ORAL_TABLET | Freq: Every day | ORAL | 0 refills | Status: DC

## 2018-12-02 NOTE — Telephone Encounter (Signed)
Pt called nurse line stating her insurance will no longer pay for her to go to Dr. Modesta Messing. Pt stated she has been filling her valium for her. Pt is wondering if you can refill until she can find someone else. Please advise.

## 2018-12-02 NOTE — Progress Notes (Signed)
Will refill given difficulties with establishing new care 2/2 coronavirus. Send in 30 tablets to [facility].  Guadalupe Dawn MD PGY-2 Family Medicine Resident

## 2018-12-02 NOTE — Telephone Encounter (Signed)
Refilled and sent to [facility] in [place] MD PGY-2 Family Medicine Resident

## 2018-12-07 ENCOUNTER — Telehealth (INDEPENDENT_AMBULATORY_CARE_PROVIDER_SITE_OTHER): Payer: Self-pay | Admitting: Radiology

## 2018-12-07 NOTE — Telephone Encounter (Signed)
I called patient to confirm appointment for 12/08/2018 in [place] office. Patient states that she has not been feeling well and would like to cancel the appointment. She will call back to reschedule once she feels better.

## 2018-12-08 ENCOUNTER — Ambulatory Visit (INDEPENDENT_AMBULATORY_CARE_PROVIDER_SITE_OTHER): Payer: Medicaid Other | Admitting: Orthopaedic Surgery

## 2018-12-08 ENCOUNTER — Other Ambulatory Visit: Payer: Self-pay | Admitting: Family Medicine

## 2018-12-15 ENCOUNTER — Encounter (INDEPENDENT_AMBULATORY_CARE_PROVIDER_SITE_OTHER): Payer: Self-pay | Admitting: Orthopaedic Surgery

## 2018-12-15 ENCOUNTER — Other Ambulatory Visit: Payer: Self-pay

## 2018-12-15 ENCOUNTER — Ambulatory Visit (INDEPENDENT_AMBULATORY_CARE_PROVIDER_SITE_OTHER): Payer: Medicaid Other | Admitting: Orthopaedic Surgery

## 2018-12-15 VITALS — Ht 62.0 in | Wt 208.0 lb

## 2018-12-15 DIAGNOSIS — G5601 Carpal tunnel syndrome, right upper limb: Secondary | ICD-10-CM | POA: Diagnosis not present

## 2018-12-15 DIAGNOSIS — M65311 Trigger thumb, right thumb: Secondary | ICD-10-CM

## 2018-12-15 NOTE — Progress Notes (Signed)
Office Visit Note   Patient: Laura Burnett           Date of Birth: 1966-04-21           MRN: [id number] Visit Date: 12/15/2018              Requested by: Guadalupe Dawn, MD [phone] [place], [place] [address] PCP: Guadalupe Dawn, MD   Assessment & Plan: Visit Diagnoses:  1. Trigger thumb, right thumb   2. Carpal tunnel syndrome, right upper limb     Plan: Trigger thumb injection performed on the right side.  She like to schedule trigger thumb release and also right carpal tunnel syndrome at the same time as an outpatient procedure.  She could have the left one done a few months later if she desired.  She will bring her electrical test when she returns in a month.Procedure discussed for carpal tunnel release risks of surgery discussed as well as trigger thumb release.  Questions were elicited and answered she understands request we proceed.  Follow-Up Instructions: No follow-ups on file.   Orders:  Orders Placed This Encounter  Procedures   Hand/UE Inj: R thumb A1   No orders of the defined types were placed in this encounter.     Procedures: Hand/UE Inj: R thumb A1 for trigger finger on 12/15/2018 11:43 AM Medications: 0.3 mL lidocaine 1 %; 0.33 mL bupivacaine 0.25 %; 13.33 mg methylPREDNISolone acetate 40 MG/ML      Clinical Data: No additional findings.   Subjective: Chief Complaint  Patient presents with   Right Thumb - Pain    HPI 53 year old female returns with problems with bilateral hand numbness worse in the right than left hand that wakes her up at night she has to shake her hands.  She has had previous electrical tests the diagnosed carpal tunnel syndrome bilaterally but she states she forgot to bring the paperwork with her.  She is here with acute locking of her right thumb and has to manually extend her thumb when it locks.  She has had previous injection of the trigger thumb May 26, 2018 and is now requesting a repeat injection today  would like to set up surgery once outpatient surgery is resumed after the corona virus break and elective surgery.  She is to use wrist splints taken anti-inflammatories without relief.  Patient states her lumbar fusion revision surgery that I did in July 2019 is doing well and she states her back is not giving her problems.  She is requesting injection for trigger thumb today until surgery can be scheduled.  She denies fever chills no associated neck pain.  Review of Systems positive for anxiety, adjustment disorder, depression, GERD, hypothyroidism, lumbar fusion L5-S1 2019.  14 point review of systems otherwise negative is a pertains HPI.   Objective: Vital Signs: Ht 5\' 2"  (1.575 m)    Wt 208 lb (94.3 kg)    BMI 38.04 kg/m   Physical Exam Constitutional:      Appearance: She is well-developed.  HENT:     Head: Normocephalic.     Right Ear: External ear normal.     Left Ear: External ear normal.  Eyes:     Pupils: Pupils are equal, round, and reactive to light.  Neck:     Thyroid: No thyromegaly.     Trachea: No tracheal deviation.  Cardiovascular:     Rate and Rhythm: Normal rate.  Pulmonary:     Effort: Pulmonary effort is normal.  Abdominal:     Palpations: Abdomen is soft.  Skin:    General: Skin is warm and dry.  Neurological:     Mental Status: She is alert and oriented to person, place, and time.  Psychiatric:        Behavior: Behavior normal.     Ortho Exam patient has positive carpal compression test right and left.  Palpable mass at A1 pulley with active locking demonstrated.  No tenderness over A2 A4 pulley.  She has mild bilateral thenar atrophy. Specialty Comments:  No specialty comments available.  Imaging: No results found.   PMFS History: Patient Active Problem List   Diagnosis Date Noted   Trigger thumb, right thumb 05/26/2018   Lumbar pain 03/07/2018   S/P lumbar fusion 02/07/2018   Viral warts 38/93/7342   Folliculitis 87/68/1157   Cough  11/30/2017   Encounter to establish care 11/21/2017   Lumbar back pain 10/22/2017   Adjustment disorder with depressed mood 05/24/2017   Hoarseness 04/21/2017   Anxiety disorder 03/16/2017   Lumbar radiculopathy 12/21/2016   Neck pain 12/20/2015   Hypothyroidism    Past Medical History:  Diagnosis Date   Anxiety    Arthritis    Asthma    as a child " exercise induced"   Bronchitis    Constipation    Depression    DJD (degenerative joint disease)    Dyspnea    with exertion; chronic cough   Fibromyalgia    GERD (gastroesophageal reflux disease)    Headache    Hypothyroidism    Obesity    PONV (postoperative nausea and vomiting)    Wears glasses     Family History  Problem Relation Age of Onset   Thyroid disease Sister    Thyroid disease Brother    Alzheimer's disease Mother    Diabetes Mother    Heart attack Father    Colon cancer Father    Heart failure Father    Cancer Brother    Cancer Brother     Past Surgical History:  Procedure Laterality Date   ABDOMINAL HYSTERECTOMY     ADENOIDECTOMY     APPLICATION OF ROBOTIC ASSISTANCE FOR SPINAL PROCEDURE N/A 12/21/2016   Procedure: APPLICATION OF ROBOTIC ASSISTANCE FOR SPINAL PROCEDURE;  Surgeon: Kevan Ny Ditty, MD;  Location: [facility];  Service: Neurosurgery;  Laterality: N/A;   BACK SURGERY     L5-S1   CYST EXCISION     uterine ;subsequent sx followed   FOOT SURGERY     HAND SURGERY     from [facility]   KNEE ARTHROSCOPY     x2   TONSILLECTOMY     Social History   Occupational History   Not on file  Tobacco Use   Smoking status: Former Smoker   Smokeless tobacco: Never Used   Tobacco comment: stopped smoking cigarettes at age 1  Substance and Sexual Activity   Alcohol use: Yes    Comment: occasional beer   Drug use: No    Comment:  per pt when she was 16 year   Sexual activity: Not Currently

## 2018-12-16 ENCOUNTER — Encounter (INDEPENDENT_AMBULATORY_CARE_PROVIDER_SITE_OTHER): Payer: Self-pay | Admitting: Orthopaedic Surgery

## 2018-12-16 DIAGNOSIS — G5601 Carpal tunnel syndrome, right upper limb: Secondary | ICD-10-CM | POA: Insufficient documentation

## 2018-12-16 MED ORDER — METHYLPREDNISOLONE ACETATE 40 MG/ML IJ SUSP
13.3300 mg | INTRAMUSCULAR | Status: AC | PRN
  Administered 2018-12-15: 13.33 mg

## 2018-12-16 MED ORDER — LIDOCAINE HCL 1 % IJ SOLN
0.3000 mL | INTRAMUSCULAR | Status: AC | PRN
  Administered 2018-12-15: 12:00:00 .3 mL

## 2018-12-16 MED ORDER — BUPIVACAINE HCL 0.25 % IJ SOLN
0.3300 mL | INTRAMUSCULAR | Status: AC | PRN
  Administered 2018-12-15: 12:00:00 .33 mL

## 2018-12-22 ENCOUNTER — Other Ambulatory Visit: Payer: Self-pay | Admitting: Family Medicine

## 2018-12-30 ENCOUNTER — Other Ambulatory Visit: Payer: Self-pay | Admitting: Family Medicine

## 2019-01-03 ENCOUNTER — Telehealth: Payer: Self-pay | Admitting: Radiology

## 2019-01-03 NOTE — Telephone Encounter (Signed)
Laura Burnett will put her on as soon as approved and [facility] starts dloing surgery again for elective cases. Should be about 2 wks I would guess. We are waiting to hear from [facility] for possible 4 /11 start for outpt but they have not approved.

## 2019-01-03 NOTE — Telephone Encounter (Signed)
noted 

## 2019-01-03 NOTE — Telephone Encounter (Signed)
Patient came in to [facility] office and brought a copy of her nerve conduction studies and report done at [facility] for review. She is ready to schedule Right Carpal Tunnel Release and Trigger Thumb Release as soon as possible.

## 2019-01-09 ENCOUNTER — Encounter (HOSPITAL_BASED_OUTPATIENT_CLINIC_OR_DEPARTMENT_OTHER): Payer: Self-pay | Admitting: *Deleted

## 2019-01-09 ENCOUNTER — Other Ambulatory Visit: Payer: Self-pay

## 2019-01-12 ENCOUNTER — Other Ambulatory Visit: Payer: Self-pay

## 2019-01-12 ENCOUNTER — Other Ambulatory Visit (HOSPITAL_COMMUNITY)
Admission: RE | Admit: 2019-01-12 | Discharge: 2019-01-12 | Disposition: A | Discharge status: Home / Self Care | Payer: Medicaid Other | Source: Ambulatory Visit | Attending: Orthopaedic Surgery | Admitting: Orthopaedic Surgery

## 2019-01-12 ENCOUNTER — Encounter (HOSPITAL_BASED_OUTPATIENT_CLINIC_OR_DEPARTMENT_OTHER)
Admission: RE | Admit: 2019-01-12 | Discharge: 2019-01-12 | Disposition: A | Discharge status: Home / Self Care | Payer: Medicaid Other | Source: Ambulatory Visit | Attending: Orthopaedic Surgery | Admitting: Orthopaedic Surgery

## 2019-01-12 DIAGNOSIS — Z87891 Personal history of nicotine dependence: Secondary | ICD-10-CM | POA: Diagnosis not present

## 2019-01-12 DIAGNOSIS — M65311 Trigger thumb, right thumb: Secondary | ICD-10-CM | POA: Diagnosis not present

## 2019-01-12 DIAGNOSIS — E785 Hyperlipidemia, unspecified: Secondary | ICD-10-CM | POA: Diagnosis not present

## 2019-01-12 DIAGNOSIS — Z1159 Encounter for screening for other viral diseases: Secondary | ICD-10-CM | POA: Insufficient documentation

## 2019-01-12 DIAGNOSIS — K219 Gastro-esophageal reflux disease without esophagitis: Secondary | ICD-10-CM | POA: Diagnosis not present

## 2019-01-12 DIAGNOSIS — F329 Major depressive disorder, single episode, unspecified: Secondary | ICD-10-CM | POA: Diagnosis not present

## 2019-01-12 DIAGNOSIS — M797 Fibromyalgia: Secondary | ICD-10-CM | POA: Diagnosis not present

## 2019-01-12 DIAGNOSIS — Z6838 Body mass index (BMI) 38.0-38.9, adult: Secondary | ICD-10-CM | POA: Diagnosis not present

## 2019-01-12 DIAGNOSIS — G5601 Carpal tunnel syndrome, right upper limb: Secondary | ICD-10-CM | POA: Diagnosis present

## 2019-01-12 DIAGNOSIS — Z79899 Other long term (current) drug therapy: Secondary | ICD-10-CM | POA: Diagnosis not present

## 2019-01-12 DIAGNOSIS — F419 Anxiety disorder, unspecified: Secondary | ICD-10-CM | POA: Diagnosis not present

## 2019-01-12 DIAGNOSIS — E669 Obesity, unspecified: Secondary | ICD-10-CM | POA: Diagnosis not present

## 2019-01-12 DIAGNOSIS — Z01812 Encounter for preprocedural laboratory examination: Secondary | ICD-10-CM | POA: Insufficient documentation

## 2019-01-12 DIAGNOSIS — E039 Hypothyroidism, unspecified: Secondary | ICD-10-CM | POA: Diagnosis not present

## 2019-01-12 LAB — BASIC METABOLIC PANEL
Anion gap: 10 (ref 5–15)
BUN: 8 mg/dL (ref 6–20)
CO2: 23 mmol/L (ref 22–32)
Calcium: 9.2 mg/dL (ref 8.9–10.3)
Chloride: 107 mmol/L (ref 98–111)
Creatinine, Ser: 0.93 mg/dL (ref 0.44–1.00)
GFR calc Af Amer: >60 mL/min (ref >60)
GFR calc non Af Amer: >60 mL/min (ref >60)
Glucose, Bld: 94 mg/dL (ref 70–99)
Potassium: 4.7 mmol/L (ref 3.5–5.1)
Sodium: 140 mmol/L (ref 135–145)

## 2019-01-12 LAB — CBC
HCT: 41.1 % (ref 36.0–46.0)
Hemoglobin: 13.4 g/dL (ref 12.0–15.0)
MCH: 29.8 pg (ref 26.0–34.0)
MCHC: 32.6 g/dL (ref 30.0–36.0)
MCV: 91.3 fL (ref 80.0–100.0)
Platelets: 312 10*3/uL (ref 150–400)
RBC: 4.5 MIL/uL (ref 3.87–5.11)
RDW: 14 % (ref 11.5–15.5)
WBC: 6.2 10*3/uL (ref 4.0–10.5)
nRBC: 0 % (ref 0.0–0.2)

## 2019-01-12 NOTE — Progress Notes (Signed)
Pt told me to stick right hand for labs, that is the operative hand. When I stuck her hand, she pulled away and pulled the needle out. Before sticking left hand, instructed her to please not pull away and to take deep breaths. When I stuck her, she yelled out and squeezed her hand, told her to please relax, take deep breath, I am in the vein.

## 2019-01-13 ENCOUNTER — Other Ambulatory Visit: Payer: Self-pay | Admitting: Family Medicine

## 2019-01-13 LAB — NOVEL CORONAVIRUS, NAA (HOSP ORDER, SEND-OUT TO REF LAB; TAT 18-24 HRS): SARS-CoV-2, NAA: NOT DETECTED

## 2019-01-15 ENCOUNTER — Encounter (HOSPITAL_BASED_OUTPATIENT_CLINIC_OR_DEPARTMENT_OTHER): Payer: Self-pay | Admitting: Anesthesiology

## 2019-01-15 NOTE — Anesthesia Preprocedure Evaluation (Addendum)
Anesthesia Evaluation  Patient identified by MRN, date of birth, ID band Patient awake    Reviewed: Allergy & Precautions, NPO status , Patient's Chart, lab work & pertinent test results  History of Anesthesia Complications (+) PONV and history of anesthetic complications  Airway Mallampati: II  TM Distance: >3 FB Neck ROM: Full    Dental no notable dental hx. (+) Teeth Intact   Pulmonary asthma , former smoker,    Pulmonary exam normal breath sounds clear to auscultation       Cardiovascular negative cardio ROS Normal cardiovascular exam Rhythm:Regular Rate:Normal     Neuro/Psych  Headaches, PSYCHIATRIC DISORDERS Anxiety Depression  Neuromuscular disease    GI/Hepatic Neg liver ROS, GERD  Medicated and Controlled,  Endo/Other  Hypothyroidism Obesity Hyperlipidemia  Renal/GU negative Renal ROS  negative genitourinary   Musculoskeletal  (+) Arthritis , Osteoarthritis,  Fibromyalgia -  Abdominal   Peds  Hematology negative hematology ROS (+)   Anesthesia Other Findings   Reproductive/Obstetrics                            Anesthesia Physical Anesthesia Plan  ASA: II  Anesthesia Plan: General   Post-op Pain Management:    Induction: Intravenous  PONV Risk Score and Plan: 4 or greater and Scopolamine patch - Pre-op, Midazolam, Ondansetron, Dexamethasone and Treatment may vary due to age or medical condition  Airway Management Planned: LMA  Additional Equipment:   Intra-op Plan:   Post-operative Plan: Extubation in OR  Informed Consent: I have reviewed the patients History and Physical, chart, labs and discussed the procedure including the risks, benefits and alternatives for the proposed anesthesia with the patient or authorized representative who has indicated his/her understanding and acceptance.     Dental advisory given  Plan Discussed with: CRNA and Surgeon  Anesthesia  Plan Comments:        Anesthesia Quick Evaluation

## 2019-01-16 ENCOUNTER — Encounter (HOSPITAL_BASED_OUTPATIENT_CLINIC_OR_DEPARTMENT_OTHER): Payer: Self-pay

## 2019-01-16 ENCOUNTER — Ambulatory Visit (HOSPITAL_BASED_OUTPATIENT_CLINIC_OR_DEPARTMENT_OTHER)
Admission: RE | Admit: 2019-01-16 | Discharge: 2019-01-16 | Disposition: A | Discharge status: Home / Self Care | Payer: Medicaid Other | Attending: Orthopaedic Surgery | Admitting: Orthopaedic Surgery

## 2019-01-16 ENCOUNTER — Encounter (HOSPITAL_BASED_OUTPATIENT_CLINIC_OR_DEPARTMENT_OTHER)
Admission: RE | Disposition: A | Discharge status: Home / Self Care | Payer: Self-pay | Source: Home / Self Care | Attending: Orthopaedic Surgery

## 2019-01-16 ENCOUNTER — Ambulatory Visit (HOSPITAL_BASED_OUTPATIENT_CLINIC_OR_DEPARTMENT_OTHER): Payer: Medicaid Other | Admitting: Anesthesiology

## 2019-01-16 ENCOUNTER — Other Ambulatory Visit: Payer: Self-pay

## 2019-01-16 DIAGNOSIS — E669 Obesity, unspecified: Secondary | ICD-10-CM | POA: Diagnosis not present

## 2019-01-16 DIAGNOSIS — Z87891 Personal history of nicotine dependence: Secondary | ICD-10-CM | POA: Insufficient documentation

## 2019-01-16 DIAGNOSIS — K219 Gastro-esophageal reflux disease without esophagitis: Secondary | ICD-10-CM | POA: Insufficient documentation

## 2019-01-16 DIAGNOSIS — M65311 Trigger thumb, right thumb: Secondary | ICD-10-CM | POA: Diagnosis not present

## 2019-01-16 DIAGNOSIS — E039 Hypothyroidism, unspecified: Secondary | ICD-10-CM | POA: Insufficient documentation

## 2019-01-16 DIAGNOSIS — Z79899 Other long term (current) drug therapy: Secondary | ICD-10-CM | POA: Insufficient documentation

## 2019-01-16 DIAGNOSIS — E785 Hyperlipidemia, unspecified: Secondary | ICD-10-CM | POA: Insufficient documentation

## 2019-01-16 DIAGNOSIS — G5601 Carpal tunnel syndrome, right upper limb: Secondary | ICD-10-CM | POA: Insufficient documentation

## 2019-01-16 DIAGNOSIS — M797 Fibromyalgia: Secondary | ICD-10-CM | POA: Insufficient documentation

## 2019-01-16 DIAGNOSIS — Z6838 Body mass index (BMI) 38.0-38.9, adult: Secondary | ICD-10-CM | POA: Insufficient documentation

## 2019-01-16 DIAGNOSIS — F419 Anxiety disorder, unspecified: Secondary | ICD-10-CM | POA: Insufficient documentation

## 2019-01-16 DIAGNOSIS — F329 Major depressive disorder, single episode, unspecified: Secondary | ICD-10-CM | POA: Insufficient documentation

## 2019-01-16 HISTORY — PX: TRIGGER FINGER RELEASE: SHX641

## 2019-01-16 HISTORY — PX: CARPAL TUNNEL RELEASE: SHX101

## 2019-01-16 SURGERY — CARPAL TUNNEL RELEASE
Anesthesia: General | Site: Wrist | Laterality: Right

## 2019-01-16 MED ORDER — FENTANYL CITRATE (PF) 100 MCG/2ML IJ SOLN
25.0000 ug | INTRAMUSCULAR | Status: DC | PRN
  Administered 2019-01-16: 50 ug via INTRAVENOUS

## 2019-01-16 MED ORDER — MIDAZOLAM HCL 2 MG/2ML IJ SOLN
INTRAMUSCULAR | Status: AC
  Filled 2019-01-16: qty 2

## 2019-01-16 MED ORDER — CEFAZOLIN SODIUM-DEXTROSE 2-4 GM/100ML-% IV SOLN
INTRAVENOUS | Status: AC
  Filled 2019-01-16: qty 100

## 2019-01-16 MED ORDER — LACTATED RINGERS IV SOLN: INTRAVENOUS | Status: DC

## 2019-01-16 MED ORDER — FENTANYL CITRATE (PF) 100 MCG/2ML IJ SOLN
INTRAMUSCULAR | Status: AC
  Filled 2019-01-16: qty 2

## 2019-01-16 MED ORDER — FENTANYL CITRATE (PF) 100 MCG/2ML IJ SOLN
INTRAMUSCULAR | Status: DC | PRN
  Administered 2019-01-16: 50 ug via INTRAVENOUS

## 2019-01-16 MED ORDER — ACETAMINOPHEN-CODEINE #3 300-30 MG PO TABS: 1.0000 | ORAL_TABLET | ORAL | 0 refills | Status: DC | PRN

## 2019-01-16 MED ORDER — SCOPOLAMINE 1 MG/3DAYS TD PT72
MEDICATED_PATCH | TRANSDERMAL | Status: AC
  Filled 2019-01-16: qty 1

## 2019-01-16 MED ORDER — ACETAMINOPHEN-CODEINE #3 300-30 MG PO TABS
1.0000 | ORAL_TABLET | ORAL | Status: DC | PRN
  Administered 2019-01-16: 1 via ORAL
  Filled 2019-01-16: qty 1

## 2019-01-16 MED ORDER — ONDANSETRON HCL 4 MG/2ML IJ SOLN
INTRAMUSCULAR | Status: DC | PRN
  Administered 2019-01-16: 4 mg via INTRAVENOUS

## 2019-01-16 MED ORDER — MEPERIDINE HCL 25 MG/ML IJ SOLN: 6.2500 mg | INTRAMUSCULAR | Status: DC | PRN

## 2019-01-16 MED ORDER — MIDAZOLAM HCL 2 MG/2ML IJ SOLN: 1.0000 mg | INTRAMUSCULAR | Status: DC | PRN

## 2019-01-16 MED ORDER — CHLORHEXIDINE GLUCONATE 4 % EX LIQD: 60.0000 mL | Freq: Once | CUTANEOUS | Status: DC

## 2019-01-16 MED ORDER — MIDAZOLAM HCL 5 MG/5ML IJ SOLN
INTRAMUSCULAR | Status: DC | PRN
  Administered 2019-01-16: 2 mg via INTRAVENOUS

## 2019-01-16 MED ORDER — LIDOCAINE 2% (20 MG/ML) 5 ML SYRINGE
INTRAMUSCULAR | Status: DC | PRN
  Administered 2019-01-16: 60 mg via INTRAVENOUS

## 2019-01-16 MED ORDER — BUPIVACAINE HCL (PF) 0.25 % IJ SOLN
INTRAMUSCULAR | Status: DC | PRN
  Administered 2019-01-16: 2.5 mL

## 2019-01-16 MED ORDER — PROPOFOL 10 MG/ML IV BOLUS
INTRAVENOUS | Status: DC | PRN
  Administered 2019-01-16: 180 mg via INTRAVENOUS

## 2019-01-16 MED ORDER — LIDOCAINE 2% (20 MG/ML) 5 ML SYRINGE
INTRAMUSCULAR | Status: AC
  Filled 2019-01-16: qty 5

## 2019-01-16 MED ORDER — PHENYLEPHRINE 40 MCG/ML (10ML) SYRINGE FOR IV PUSH (FOR BLOOD PRESSURE SUPPORT)
PREFILLED_SYRINGE | INTRAVENOUS | Status: DC | PRN
  Administered 2019-01-16: 80 ug via INTRAVENOUS
  Administered 2019-01-16: 120 ug via INTRAVENOUS

## 2019-01-16 MED ORDER — BUPIVACAINE HCL (PF) 0.25 % IJ SOLN
INTRAMUSCULAR | Status: AC
  Filled 2019-01-16: qty 30

## 2019-01-16 MED ORDER — LACTATED RINGERS IV SOLN
INTRAVENOUS | Status: DC | PRN
  Administered 2019-01-16: 07:00:00 via INTRAVENOUS

## 2019-01-16 MED ORDER — CEFAZOLIN SODIUM-DEXTROSE 2-4 GM/100ML-% IV SOLN
2.0000 g | INTRAVENOUS | Status: AC
  Administered 2019-01-16: 2 g via INTRAVENOUS

## 2019-01-16 MED ORDER — SCOPOLAMINE 1 MG/3DAYS TD PT72: 1.0000 | MEDICATED_PATCH | TRANSDERMAL | Status: DC

## 2019-01-16 MED ORDER — DEXAMETHASONE SODIUM PHOSPHATE 10 MG/ML IJ SOLN
INTRAMUSCULAR | Status: DC | PRN
  Administered 2019-01-16: 5 mg via INTRAVENOUS

## 2019-01-16 MED ORDER — ONDANSETRON HCL 4 MG/2ML IJ SOLN: 4.0000 mg | Freq: Once | INTRAMUSCULAR | Status: DC | PRN

## 2019-01-16 MED ORDER — SCOPOLAMINE 1 MG/3DAYS TD PT72
1.0000 | MEDICATED_PATCH | Freq: Once | TRANSDERMAL | Status: DC | PRN
  Administered 2019-01-16: 1.5 mg via TRANSDERMAL

## 2019-01-16 MED ORDER — FENTANYL CITRATE (PF) 100 MCG/2ML IJ SOLN: 50.0000 ug | INTRAMUSCULAR | Status: DC | PRN

## 2019-01-16 MED ORDER — LIDOCAINE HCL (PF) 1 % IJ SOLN
INTRAMUSCULAR | Status: AC
  Filled 2019-01-16: qty 30

## 2019-01-16 MED ORDER — LIDOCAINE HCL (PF) 1 % IJ SOLN
INTRAMUSCULAR | Status: DC | PRN
  Administered 2019-01-16: 2.5 mL

## 2019-01-16 SURGICAL SUPPLY — 41 items
BANDAGE ACE 4X5 VEL STRL LF (GAUZE/BANDAGES/DRESSINGS) ×4 IMPLANT
BENZOIN TINCTURE PRP APPL 2/3 (GAUZE/BANDAGES/DRESSINGS) IMPLANT
BLADE SURG 15 STRL LF DISP TIS (BLADE) ×2 IMPLANT
BLADE SURG 15 STRL SS (BLADE) ×2
BNDG ESMARK 4X9 LF (GAUZE/BANDAGES/DRESSINGS) IMPLANT
CLOSURE WOUND 1/2 X4 (GAUZE/BANDAGES/DRESSINGS)
CORD BIPOLAR FORCEPS 12FT (ELECTRODE) ×4 IMPLANT
COVER BACK TABLE REUSABLE LG (DRAPES) ×4 IMPLANT
COVER MAYO STAND REUSABLE (DRAPES) ×4 IMPLANT
COVER WAND RF STERILE (DRAPES) IMPLANT
CUFF TOURN SGL QUICK 18X4 (TOURNIQUET CUFF) ×4 IMPLANT
DRAPE EXTREMITY T 121X128X90 (DISPOSABLE) ×4 IMPLANT
DRAPE SURG 17X23 STRL (DRAPES) ×4 IMPLANT
DURAPREP 26ML APPLICATOR (WOUND CARE) ×4 IMPLANT
GAUZE SPONGE 4X4 12PLY STRL (GAUZE/BANDAGES/DRESSINGS) ×4 IMPLANT
GAUZE XEROFORM 1X8 LF (GAUZE/BANDAGES/DRESSINGS) ×4 IMPLANT
GLOVE BIO SURGEON STRL SZ7 (GLOVE) ×4 IMPLANT
GLOVE BIO SURGEON STRL SZ7.5 (GLOVE) ×4 IMPLANT
GLOVE BIOGEL PI IND STRL 7.5 (GLOVE) ×2 IMPLANT
GLOVE BIOGEL PI IND STRL 8 (GLOVE) ×2 IMPLANT
GLOVE BIOGEL PI INDICATOR 7.5 (GLOVE) ×2
GLOVE BIOGEL PI INDICATOR 8 (GLOVE) ×2
GOWN STRL REUS W/ TWL LRG LVL3 (GOWN DISPOSABLE) ×2 IMPLANT
GOWN STRL REUS W/ TWL XL LVL3 (GOWN DISPOSABLE) ×2 IMPLANT
GOWN STRL REUS W/TWL LRG LVL3 (GOWN DISPOSABLE) ×2
GOWN STRL REUS W/TWL XL LVL3 (GOWN DISPOSABLE) ×2
LOOP VESSEL MAXI BLUE (MISCELLANEOUS) IMPLANT
NEEDLE HYPO 25X1 1.5 SAFETY (NEEDLE) ×4 IMPLANT
NS IRRIG 1000ML POUR BTL (IV SOLUTION) ×4 IMPLANT
PACK BASIN DAY SURGERY FS (CUSTOM PROCEDURE TRAY) ×4 IMPLANT
PAD CAST 3X4 CTTN HI CHSV (CAST SUPPLIES) ×2 IMPLANT
PADDING CAST ABS 4INX4YD NS (CAST SUPPLIES)
PADDING CAST ABS COTTON 4X4 ST (CAST SUPPLIES) IMPLANT
PADDING CAST COTTON 3X4 STRL (CAST SUPPLIES) ×2
STOCKINETTE 4X48 STRL (DRAPES) ×4 IMPLANT
STRIP CLOSURE SKIN 1/2X4 (GAUZE/BANDAGES/DRESSINGS) IMPLANT
SUT ETHILON 4 0 PS 2 18 (SUTURE) ×4 IMPLANT
SYR BULB 3OZ (MISCELLANEOUS) ×4 IMPLANT
SYR CONTROL 10ML LL (SYRINGE) ×4 IMPLANT
TOWEL GREEN STERILE FF (TOWEL DISPOSABLE) ×4 IMPLANT
UNDERPAD 30X30 (UNDERPADS AND DIAPERS) ×4 IMPLANT

## 2019-01-16 NOTE — Interval H&P Note (Signed)
History and Physical Interval Note:  01/16/2019 7:23 AM  Laura Burnett  has presented today for surgery, with the diagnosis of right trigger thumb, right carpal tunnel syndrome.  The various methods of treatment have been discussed with the patient and family. After consideration of risks, benefits and other options for treatment, the patient has consented to  Procedure(s): RIGHT CARPAL TUNNEL RELEASE (Right) RIGHT TRIGGER THUMB RELEASE (Right) as a surgical intervention.  The patient's history has been reviewed, patient examined, no change in status, stable for surgery.  I have reviewed the patient's chart and labs.  Questions were answered to the patient's satisfaction.     [name]

## 2019-01-16 NOTE — Op Note (Signed)
Preop diagnosis: Right carpal tunnel syndrome, right trigger thumb.  Postop diagnosis: Same  Procedure: Right carpal tunnel release, right trigger thumb release.  Surgeon: Rodell Perna, MD  Anesthesia: LMA plus local anesthesia 0.25% Marcaine 1% lidocaine 1:1 mixture 5 cc total  Tourniquet: 250 pressure x11 minutes.  Findings median nerve compression at the carpal canal.  Tenosynovitis with triggering at right thumb A1 pulley.  Procedure after induction general anesthesia preoperative Ancef prophylaxis proximal arm tourniquet prepping with the DuraPrep extremity sheets and drapes were applied timeout procedure was completed sterile skin marker was used.  Incision was made after elevating the hand wrapping with Esmarch and inflating the tourniquet.  Outline was made for both the trigger thumb release over the palpable A1 pulley where patient had active triggering and also over the carpal canal.  Skin was incised carpal canal was released first.  Wheat Lander retractor was placed and transverse carpal ligament was divided along the ulnar aspect of the nerve.  There is chronic flexor tenosynovitis present with hourglass deformity of the nerve.  Complete release was performed distal into the palmar fat with visualization of the arch.  Approximately fingertip was able to be passed into the distal forearm.  Irrigation with saline solution and covered with a moist sponge.  Transverse incision was made over the A1 pulley and Ragnell retractors were used.  There was cortisone preservative noted from previous injections which had not taking care of her symptoms.  Thickening of the A1 pulley was noted and pulley was excised using Ragnell retractors to protect the digital artery nerves.  Complete release was performed thumb was flexed and extended there was no further triggering.  Irrigated with saline solution.  2 simple nylon 4-0 sutures were applied for the trigger thumb.  Hemostasis with bipolar cautery and the  carpal tunnel canal and then interrupted nylon 4-0 sutures for skin closure.  Xeroform 4 x 4's 20 nurse with web roll and Ace wrap was applied for soft dressing.  Patient tolerated procedure well transferred recovery in stable condition.

## 2019-01-16 NOTE — Anesthesia Postprocedure Evaluation (Signed)
Anesthesia Post Note  Patient: Laura Burnett  Procedure(s) Performed: RIGHT CARPAL TUNNEL RELEASE (Right Wrist) RIGHT TRIGGER THUMB RELEASE (Right Thumb)     Patient location during evaluation: PACU Anesthesia Type: General Level of consciousness: awake and alert and oriented Pain management: pain level controlled Vital Signs Assessment: post-procedure vital signs reviewed and stable Respiratory status: spontaneous breathing, nonlabored ventilation and respiratory function stable Cardiovascular status: blood pressure returned to baseline and stable Postop Assessment: no apparent nausea or vomiting Anesthetic complications: no    Last Vitals:  Vitals:   01/16/19 0845 01/16/19 0850  BP: 111/81 113/81  Pulse: 93 93  Resp: 20 19  Temp:    SpO2: 98% 94%    Last Pain:  Vitals:   01/16/19 0850  TempSrc:   PainSc: 7                  , A.

## 2019-01-16 NOTE — H&P (Signed)
Office Visit Note              Patient: Laura Burnett                                             Date of Birth: 16-Jan-1966                                                    MRN: [id number] Visit Date: 12/15/2018                                                                     Requested by: Guadalupe Dawn, MD [phone], [place] [address] PCP: Guadalupe Dawn, MD   Assessment & Plan: Visit Diagnoses:  1. Trigger thumb, right thumb   2. Carpal tunnel syndrome, right upper limb     Plan: Trigger thumb injection performed on the right side.  She like to schedule trigger thumb release and also right carpal tunnel syndrome at the same time as an outpatient procedure.  She could have the left one done a few months later if she desired.  She will bring her electrical test when she returns in a month.Procedure discussed for carpal tunnel release risks of surgery discussed as well as trigger thumb release.  Questions were elicited and answered she understands request we proceed.  Follow-Up Instructions: No follow-ups on file.   Orders:     Orders Placed This Encounter  Procedures  . Hand/UE Inj: R thumb A1   No orders of the defined types were placed in this encounter.     Procedures: Hand/UE Inj: R thumb A1 for trigger finger on 12/15/2018 11:43 AM Medications: 0.3 mL lidocaine 1 %; 0.33 mL bupivacaine 0.25 %; 13.33 mg methylPREDNISolone acetate 40 MG/ML      Clinical Data: No additional findings.   Subjective:    Chief Complaint  Patient presents with  . Right Thumb - Pain    HPI 53 year old female returns with problems with bilateral hand numbness worse in the right than left hand that wakes her up at night she has to shake her hands.  She has had previous electrical tests the diagnosed carpal tunnel syndrome bilaterally but she states she forgot to bring the paperwork with her.  She is here with acute locking of her right thumb and has  to manually extend her thumb when it locks.  She has had previous injection of the trigger thumb May 26, 2018 and is now requesting a repeat injection today would like to set up surgery once outpatient surgery is resumed after the corona virus break and elective surgery.  She is to use wrist splints taken anti-inflammatories without relief.  Patient states her lumbar fusion revision surgery that I did in July 2019 is doing well and she states her back is not giving her problems.  She is requesting injection for trigger thumb today until surgery can be scheduled.  She denies fever chills no associated neck pain.  Review of Systems positive for anxiety, adjustment disorder, depression, GERD, hypothyroidism, lumbar fusion L5-S1 2019.  14 point review of systems otherwise negative is a pertains HPI.   Objective: Vital Signs: Ht 5\' 2"  (1.575 m)   Wt 208 lb (94.3 kg)   BMI 38.04 kg/m   Physical Exam Constitutional:      Appearance: She is well-developed.  HENT:     Head: Normocephalic.     Right Ear: External ear normal.     Left Ear: External ear normal.  Eyes:     Pupils: Pupils are equal, round, and reactive to light.  Neck:     Thyroid: No thyromegaly.     Trachea: No tracheal deviation.  Cardiovascular:     Rate and Rhythm: Normal rate.  Pulmonary:     Effort: Pulmonary effort is normal.  Abdominal:     Palpations: Abdomen is soft.  Skin:    General: Skin is warm and dry.  Neurological:     Mental Status: She is alert and oriented to person, place, and time.  Psychiatric:        Behavior: Behavior normal.     Ortho Exam patient has positive carpal compression test right and left.  Palpable mass at A1 pulley with active locking demonstrated.  No tenderness over A2 A4 pulley.  She has mild bilateral thenar atrophy. Specialty Comments:  No specialty comments available.  Imaging: No results found.   PMFS History:     Patient Active Problem List   Diagnosis  Date Noted  . Trigger thumb, right thumb 05/26/2018  . Lumbar pain 03/07/2018  . S/P lumbar fusion 02/07/2018  . Viral warts 12/16/2017  . Folliculitis 40/34/7425  . Cough 11/30/2017  . Encounter to establish care 11/21/2017  . Lumbar back pain 10/22/2017  . Adjustment disorder with depressed mood 05/24/2017  . Hoarseness 04/21/2017  . Anxiety disorder 03/16/2017  . Lumbar radiculopathy 12/21/2016  . Neck pain 12/20/2015  . Hypothyroidism        Past Medical History:  Diagnosis Date  . Anxiety   . Arthritis   . Asthma    as a child " exercise induced"  . Bronchitis   . Constipation   . Depression   . DJD (degenerative joint disease)   . Dyspnea    with exertion; chronic cough  . Fibromyalgia   . GERD (gastroesophageal reflux disease)   . Headache   . Hypothyroidism   . Obesity   . PONV (postoperative nausea and vomiting)   . Wears glasses          Family History  Problem Relation Age of Onset  . Thyroid disease Sister   . Thyroid disease Brother   . Alzheimer's disease Mother   . Diabetes Mother   . Heart attack Father   . Colon cancer Father   . Heart failure Father   . Cancer Brother   . Cancer Brother          Past Surgical History:  Procedure Laterality Date  . ABDOMINAL HYSTERECTOMY    . ADENOIDECTOMY    . APPLICATION OF ROBOTIC ASSISTANCE FOR SPINAL PROCEDURE N/A 12/21/2016   Procedure: APPLICATION OF ROBOTIC ASSISTANCE FOR SPINAL PROCEDURE;  Surgeon: Kevan Ny Ditty, MD;  Location: [facility];  Service: Neurosurgery;  Laterality: N/A;  . BACK SURGERY     L5-S1  . CYST EXCISION     uterine ;subsequent sx followed  . FOOT SURGERY    . HAND SURGERY  from [place]  . KNEE ARTHROSCOPY     x2  . TONSILLECTOMY     Social History        Occupational History  . Not on file  Tobacco Use  . Smoking status: Former Research scientist (life sciences)  . Smokeless tobacco: Never Used  . Tobacco comment: stopped smoking cigarettes  at age 31  Substance and Sexual Activity  . Alcohol use: Yes    Comment: occasional beer  . Drug use: No    Comment:  per pt when she was 16 year  . Sexual activity: Not Currently              Electronically signed by [name], MD at 12/16/2018 9:04 AM     Office Visit on 12/15/2018       Detailed Report

## 2019-01-16 NOTE — Transfer of Care (Signed)
Immediate Anesthesia Transfer of Care Note  Patient: Laura Burnett  Procedure(s) Performed: RIGHT CARPAL TUNNEL RELEASE (Right Wrist) RIGHT TRIGGER THUMB RELEASE (Right Thumb)  Patient Location: PACU  Anesthesia Type:General  Level of Consciousness: sedated and patient cooperative  Airway & Oxygen Therapy: Patient Spontanous Breathing and Patient connected to nasal cannula oxygen  Post-op Assessment: Report given to RN and Post -op Vital signs reviewed and stable  Post vital signs: Reviewed and stable  Last Vitals:  Vitals Value Taken Time  BP 104/77 01/16/2019  8:15 AM  Temp    Pulse 89 01/16/2019  8:18 AM  Resp 13 01/16/2019  8:18 AM  SpO2 99 % 01/16/2019  8:18 AM  Vitals shown include unvalidated device data.  Last Pain:  Vitals:   01/16/19 8472  TempSrc: Oral  PainSc: 8       Patients Stated Pain Goal: 6 (03/20/81 8833)  Complications: No apparent anesthesia complications

## 2019-01-16 NOTE — Discharge Instructions (Signed)
Ice on and off to help pain. See Dr. Lorin Mercy in one week. Elevate hand above heart for 24 to 48 hrs to decrease pain and swelling.     Post Anesthesia Home Care Instructions  Activity: Get plenty of rest for the remainder of the day. A responsible individual must stay with you for 24 hours following the procedure.  For the next 24 hours, DO NOT: -Drive a car -Paediatric nurse -Drink alcoholic beverages -Take any medication unless instructed by your physician -Make any legal decisions or sign important papers.  Meals: Start with liquid foods such as gelatin or soup. Progress to regular foods as tolerated. Avoid greasy, spicy, heavy foods. If nausea and/or vomiting occur, drink only clear liquids until the nausea and/or vomiting subsides. Call your physician if vomiting continues.  Special Instructions/Symptoms: Your throat may feel dry or sore from the anesthesia or the breathing tube placed in your throat during surgery. If this causes discomfort, gargle with warm salt water. The discomfort should disappear within 24 hours.  If you had a scopolamine patch placed behind your ear for the management of post- operative nausea and/or vomiting:  1. The medication in the patch is effective for 72 hours, after which it should be removed.  Wrap patch in a tissue and discard in the trash. Wash hands thoroughly with soap and water. 2. You may remove the patch earlier than 72 hours if you experience unpleasant side effects which may include dry mouth, dizziness or visual disturbances. 3. Avoid touching the patch. Wash your hands with soap and water after contact with the patch.

## 2019-01-16 NOTE — Anesthesia Procedure Notes (Signed)
Procedure Name: LMA Insertion Date/Time: 01/16/2019 7:38 AM Performed by: Gwyndolyn Saxon, CRNA Pre-anesthesia Checklist: Patient identified, Emergency Drugs available, Suction available and Patient being monitored Patient Re-evaluated:Patient Re-evaluated prior to induction Oxygen Delivery Method: Circle system utilized Preoxygenation: Pre-oxygenation with 100% oxygen Induction Type: IV induction Ventilation: Mask ventilation without difficulty LMA: LMA inserted LMA Size: 4.0 Number of attempts: 1 Airway Equipment and Method: Patient positioned with wedge pillow Placement Confirmation: positive ETCO2 and breath sounds checked- equal and bilateral Tube secured with: Tape Dental Injury: Teeth and Oropharynx as per pre-operative assessment

## 2019-01-17 ENCOUNTER — Telehealth: Payer: Self-pay | Admitting: Orthopaedic Surgery

## 2019-01-17 ENCOUNTER — Encounter (HOSPITAL_BASED_OUTPATIENT_CLINIC_OR_DEPARTMENT_OTHER): Payer: Self-pay | Admitting: Orthopaedic Surgery

## 2019-01-17 NOTE — Telephone Encounter (Signed)
Please see below and advise.  I have sent another message in regards to Tylenol #3 not working for pain.

## 2019-01-17 NOTE — Telephone Encounter (Signed)
I called discussed.  

## 2019-01-17 NOTE — Telephone Encounter (Signed)
Rash where tube was taped. She will try 2 of the tylenol # 3 . Not allergic reaction. She was asking for norco but I discussed she should not need that.

## 2019-01-17 NOTE — Telephone Encounter (Signed)
Please advise 

## 2019-01-17 NOTE — Telephone Encounter (Signed)
Patient called advised her cheeks are red from taking the Tylenol. Patient said she stopped taking the Tylenol. The number to contact patient is [phone]

## 2019-01-17 NOTE — Telephone Encounter (Signed)
Patient called advised the Tylenol 3  Is not helping with the pain she is having. Patient said she had surgery yesterday. Patient said she has been doing ice packs. The number to contact patient is [phone]

## 2019-01-18 ENCOUNTER — Ambulatory Visit: Payer: Self-pay | Admitting: Endocrinology

## 2019-01-19 ENCOUNTER — Other Ambulatory Visit: Payer: Self-pay | Admitting: Family Medicine

## 2019-01-19 MED ORDER — TRAMADOL HCL 50 MG PO TABS: 50.0000 mg | ORAL_TABLET | Freq: Three times a day (TID) | ORAL | 0 refills | Status: DC | PRN

## 2019-01-19 NOTE — Telephone Encounter (Signed)
Patient called back today and states that she is allergic to Tylenol #3 and in pain. She would also like to know if she should change her bandage?  Dr. Lorin Mercy called and spoke with patient. Advised to call in Tramadol 1 po tid prn pain #30 with no refills.  Tramadol called to pharmacy.

## 2019-01-20 ENCOUNTER — Telehealth: Payer: Self-pay | Admitting: Orthopaedic Surgery

## 2019-01-20 NOTE — Telephone Encounter (Signed)
Patient called advised the pharmacy is waiting for prior auth before they will fill Rx for (Ultram) The number to contact patient is [phone]

## 2019-01-20 NOTE — Telephone Encounter (Signed)
I have not received a prior auth request for medication.

## 2019-01-25 NOTE — Telephone Encounter (Signed)
I tried to reach patient. Still have not received any paperwork regarding prior authorization. Will await fax from pharmacy.

## 2019-01-26 ENCOUNTER — Ambulatory Visit (INDEPENDENT_AMBULATORY_CARE_PROVIDER_SITE_OTHER): Payer: Medicaid Other | Admitting: Orthopaedic Surgery

## 2019-01-26 ENCOUNTER — Other Ambulatory Visit: Payer: Self-pay

## 2019-01-26 ENCOUNTER — Encounter: Payer: Self-pay | Admitting: Orthopaedic Surgery

## 2019-01-26 VITALS — Ht 61.0 in | Wt 199.0 lb

## 2019-01-26 DIAGNOSIS — M65311 Trigger thumb, right thumb: Secondary | ICD-10-CM

## 2019-01-26 DIAGNOSIS — G5601 Carpal tunnel syndrome, right upper limb: Secondary | ICD-10-CM

## 2019-01-26 NOTE — Progress Notes (Signed)
Post-Op Visit Note   Patient: Laura Burnett           Date of Birth: 08-10-1966           MRN: [id number] Visit Date: 01/26/2019 PCP: Guadalupe Dawn, MD   Assessment & Plan: Follow-up right carpal tunnel release right trigger thumb release.  Sutures are harvested today Steri-Strips applied.  She had problems with the Tylenol 3 breaking out in a rash tramadol caused headaches and dizziness she is just used ibuprofen.  Incision looks good thumb is no longer triggering she still has some soreness in the thumb she has a splint at home she can use for her wrist for the next week or so if she is active with her hand.  Patient has some cervical spondylosis without nerve compression by MRI last year her neck still bothers her with some soreness.  Opposite hand on the left show some carpal tunnel not as severe as the right.  She will call if he gets worse.  Chief Complaint:  Chief Complaint  Patient presents with  . Right Hand - Routine Post Op    01/16/2019 Right CTR, Right Trigger Thumb Release   Visit Diagnoses:  1. Trigger thumb, right thumb   2. Carpal tunnel syndrome, right upper limb     Plan: Sutures removed return as needed.  Follow-Up Instructions: Return if symptoms worsen or fail to improve.   Orders:  No orders of the defined types were placed in this encounter.  No orders of the defined types were placed in this encounter.   Imaging: No results found.  PMFS History: Patient Active Problem List   Diagnosis Date Noted  . Carpal tunnel syndrome, right upper limb 12/16/2018  . Trigger thumb, right thumb 05/26/2018  . Lumbar pain 03/07/2018  . S/P lumbar fusion 02/07/2018  . Viral warts 12/16/2017  . Folliculitis 98/92/1194  . Cough 11/30/2017  . Encounter to establish care 11/21/2017  . Lumbar back pain 10/22/2017  . Adjustment disorder with depressed mood 05/24/2017  . Hoarseness 04/21/2017  . Anxiety disorder 03/16/2017  . Lumbar radiculopathy 12/21/2016  .  Neck pain 12/20/2015  . Hypothyroidism    Past Medical History:  Diagnosis Date  . Anxiety   . Arthritis   . Asthma    as a child " exercise induced"  . Bronchitis   . Constipation   . Depression   . DJD (degenerative joint disease)   . Fibromyalgia   . GERD (gastroesophageal reflux disease)   . Headache   . Hypothyroidism   . Obesity   . PONV (postoperative nausea and vomiting)   . Wears glasses     Family History  Problem Relation Age of Onset  . Thyroid disease Sister   . Thyroid disease Brother   . Alzheimer's disease Mother   . Diabetes Mother   . Heart attack Father   . Colon cancer Father   . Heart failure Father   . Cancer Brother   . Cancer Brother     Past Surgical History:  Procedure Laterality Date  . ABDOMINAL HYSTERECTOMY    . ADENOIDECTOMY    . APPLICATION OF ROBOTIC ASSISTANCE FOR SPINAL PROCEDURE N/A 12/21/2016   Procedure: APPLICATION OF ROBOTIC ASSISTANCE FOR SPINAL PROCEDURE;  Surgeon: Kevan Ny Ditty, MD;  Location: [facility];  Service: Neurosurgery;  Laterality: N/A;  . BACK SURGERY     L5-S1  . CARPAL TUNNEL RELEASE Right 01/16/2019   Procedure: RIGHT CARPAL TUNNEL RELEASE;  Surgeon:  [name], MD;  Location: [facility];  Service: Orthopedics;  Laterality: Right;  . CYST EXCISION     uterine ;subsequent sx followed  . FOOT SURGERY    . HAND SURGERY     from AA  . KNEE ARTHROSCOPY     x2  . TONSILLECTOMY    . TRIGGER FINGER RELEASE Right 01/16/2019   Procedure: RIGHT TRIGGER THUMB RELEASE;  Surgeon: [name], MD;  Location: [facility];  Service: Orthopedics;  Laterality: Right;   Social History   Occupational History  . Not on file  Tobacco Use  . Smoking status: Former Research scientist (life sciences)  . Smokeless tobacco: Never Used  . Tobacco comment: stopped smoking cigarettes at age 59  Substance and Sexual Activity  . Alcohol use: Yes    Comment: occasional beer  . Drug use: No    Comment:  per pt when she was 16  year  . Sexual activity: Not Currently

## 2019-02-02 ENCOUNTER — Encounter: Payer: Self-pay | Admitting: Orthopaedic Surgery

## 2019-02-02 ENCOUNTER — Ambulatory Visit (INDEPENDENT_AMBULATORY_CARE_PROVIDER_SITE_OTHER): Payer: Medicaid Other | Admitting: Orthopaedic Surgery

## 2019-02-02 VITALS — Ht 61.0 in | Wt 199.0 lb

## 2019-02-02 DIAGNOSIS — M65311 Trigger thumb, right thumb: Secondary | ICD-10-CM

## 2019-02-02 DIAGNOSIS — G5601 Carpal tunnel syndrome, right upper limb: Secondary | ICD-10-CM

## 2019-02-02 NOTE — Progress Notes (Signed)
Follow-up right carpal tunnel release right trigger thumb release.  Sutures are harvested today.  She has some problems with carpal tunnel on the opposite left wrist.  She can call us in the future if this gets worse.  She is happy the results of surgery.  Patient also had L5-S1 revision surgery in July 2019 which is doing well.

## 2019-02-09 ENCOUNTER — Ambulatory Visit (INDEPENDENT_AMBULATORY_CARE_PROVIDER_SITE_OTHER): Payer: Medicaid Other | Admitting: Family Medicine

## 2019-02-09 ENCOUNTER — Other Ambulatory Visit: Payer: Self-pay

## 2019-02-09 DIAGNOSIS — G5601 Carpal tunnel syndrome, right upper limb: Secondary | ICD-10-CM

## 2019-02-09 DIAGNOSIS — W19XXXA Unspecified fall, initial encounter: Secondary | ICD-10-CM

## 2019-02-09 NOTE — Patient Instructions (Addendum)
It was great seeing you today! I think your head looks fine aside from some mild bruising. The pain from the bruising and the headache might last for 1-2 days. You can continue taking tylenol and ibuprofen in alternation for this.   In regards to the healing incision from your carpal tunnel, I would give this another 10-14 days and if it isnt pretty much completely healed I would likely recommend having Dr. Lorin Mercy look at it.

## 2019-02-10 DIAGNOSIS — W19XXXA Unspecified fall, initial encounter: Secondary | ICD-10-CM | POA: Insufficient documentation

## 2019-02-10 NOTE — Progress Notes (Signed)
   HPI 53 year old female who presents after rolling out of bed this am and hitting her head. She states the fall happened around 12 hours prior to clinic presentation. She was able to see some bruising but did not notice any bleeding. She has some mild throbbing pain at the site of the bruising and a mild headache. She has been alternating tylenol and ibuprofen, which she has already been doing as she is s/p carpal tunnel release.   CC: fell and hit head   ROS:   Review of Systems See HPI for ROS.   CC, SH/smoking status, and VS noted  Objective: BP 100/70   Pulse 93   SpO2 28%  Gen: 53 year old female, no acute distress, resting comfortably HEENT: eomi, perrla CV: RRR, no murmur Resp: CTAB, no wheezes, non-labored Abd: SNTND, BS present, no guarding or organomegaly Neuro: cn 2-12 intact, no focal deficits, 5/5 strength all muscle groups Head: mild bruising noted on right, medial parietal distribution. No abrasion. Right arm: well healing incision site from carpal tunnel release. Very mild fluid still noted.   Assessment and plan:  Fall Per patient she rolled out of bed and hit her head on the ground. No alarm symptoms. Completely benign neuro exam. After 12 hours would expect to see some neuro symptoms if subdural/epidural present. Can continue alternating ibuprofen and tylenol for HA/pain. Discussed alarm symptoms. Can push bed up against wall if she is concerned about rolling out of bed again.  Carpal tunnel syndrome, right upper limb Well healing incision, with mild swelling still noted. Can follow up with dr. Lorin Mercy if she notices any abnormality.   No orders of the defined types were placed in this encounter.   No orders of the defined types were placed in this encounter.    Guadalupe Dawn MD PGY-2 Family Medicine Resident  02/10/2019 8:56 AM

## 2019-02-10 NOTE — Assessment & Plan Note (Signed)
Well healing incision, with mild swelling still noted. Can follow up with dr. Lorin Mercy if she notices any abnormality.

## 2019-02-10 NOTE — Assessment & Plan Note (Signed)
Per patient she rolled out of bed and hit her head on the ground. No alarm symptoms. Completely benign neuro exam. After 12 hours would expect to see some neuro symptoms if subdural/epidural present. Can continue alternating ibuprofen and tylenol for HA/pain. Discussed alarm symptoms. Can push bed up against wall if she is concerned about rolling out of bed again.

## 2019-02-13 ENCOUNTER — Telehealth: Payer: Self-pay | Admitting: *Deleted

## 2019-02-13 NOTE — Telephone Encounter (Signed)
Pt calls because she fell out of bed again this am.  This time she hit her head/ear and her "ear is busted open".   She declines appt here as she is going to go to the ED in [place] as it is closer.   She would like Dr. Kris Mouton to review her meds and see if one of her "meds may be causing me to fall out of the bed".  Christen Bame, CMA

## 2019-02-18 IMAGING — DX DG CHEST 2V
2 series · 2 of 2 positions shown · non-contrast
Comparison: 06/08/2017

CLINICAL DATA: Productive cough, 2 days duration.

EXAM:
CHEST - 2 VIEW

[chest pa]
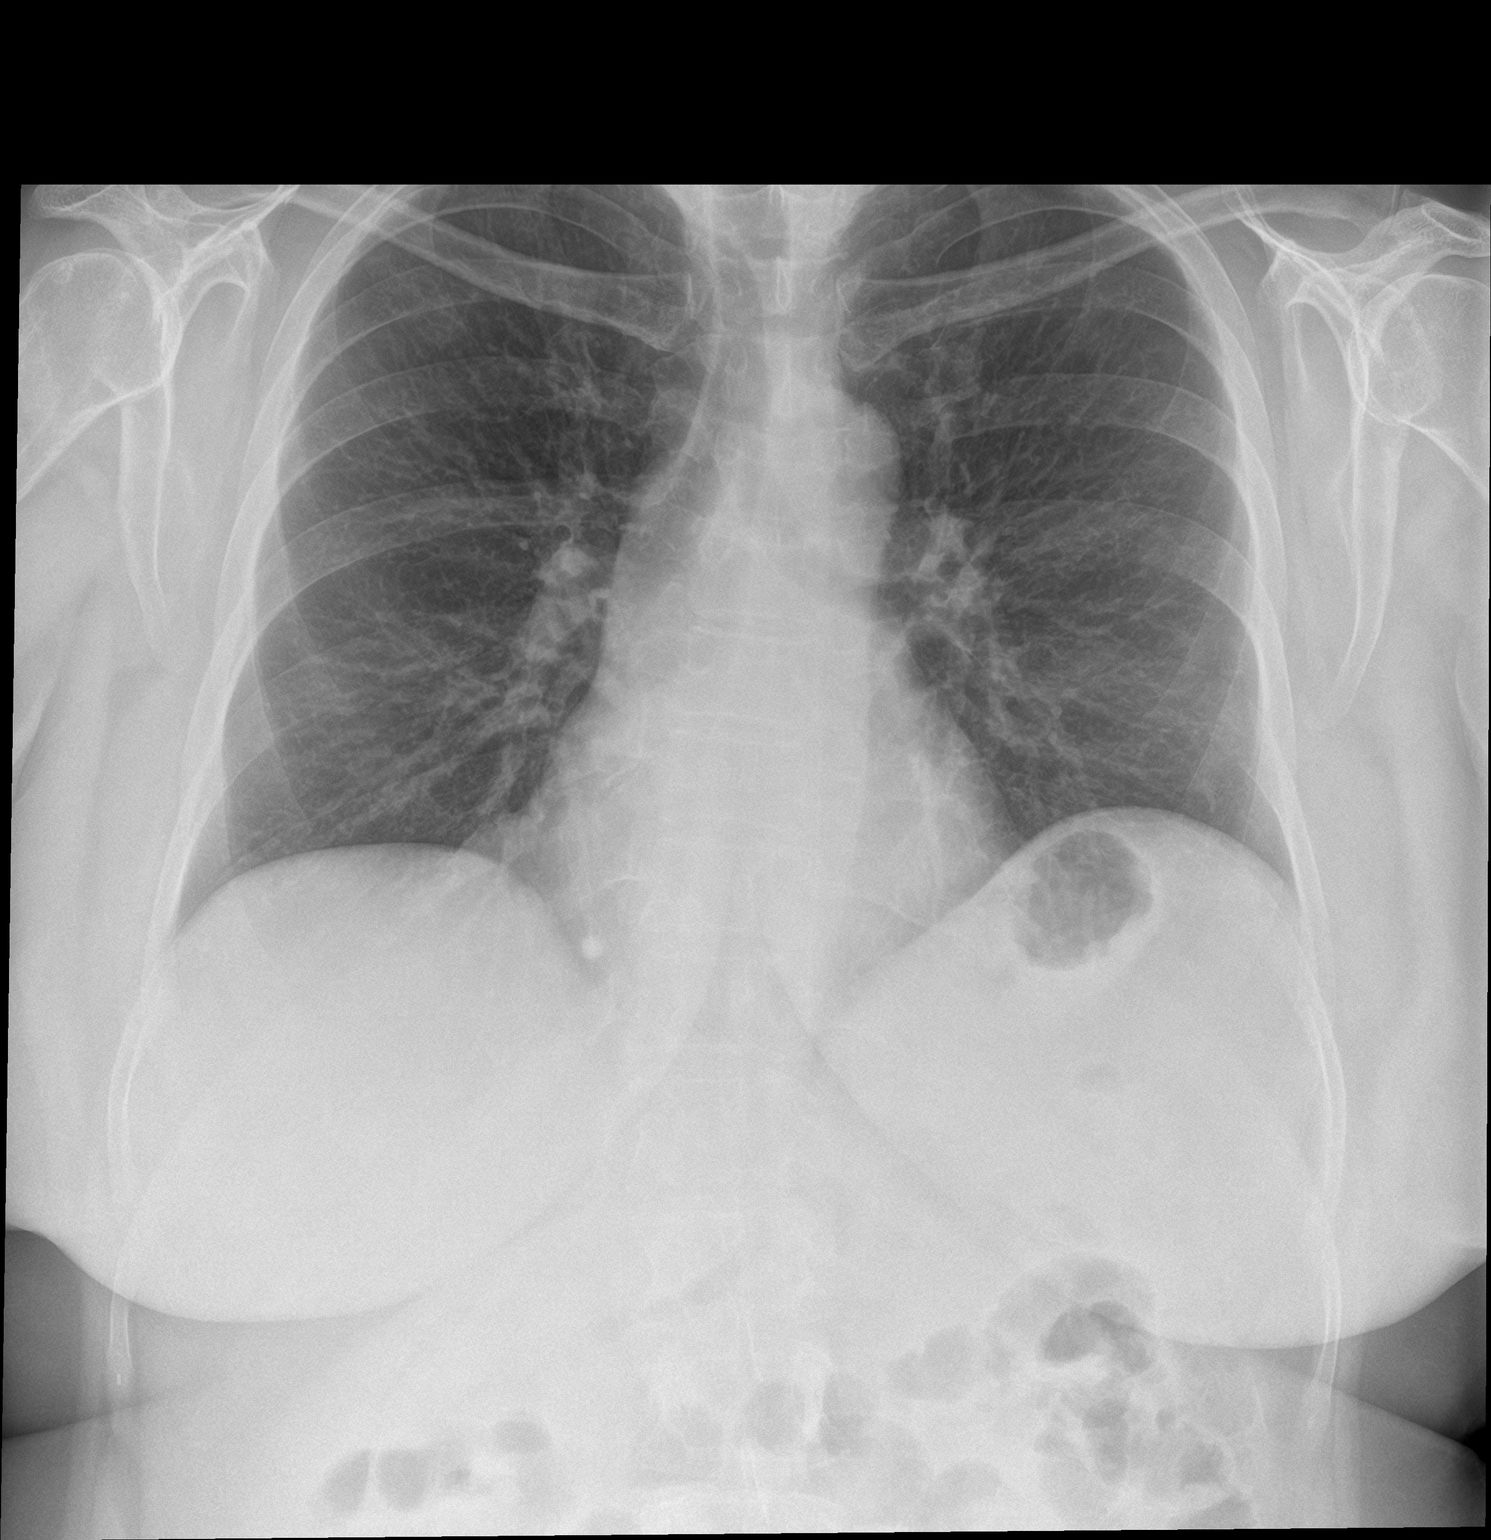

[chest lat]
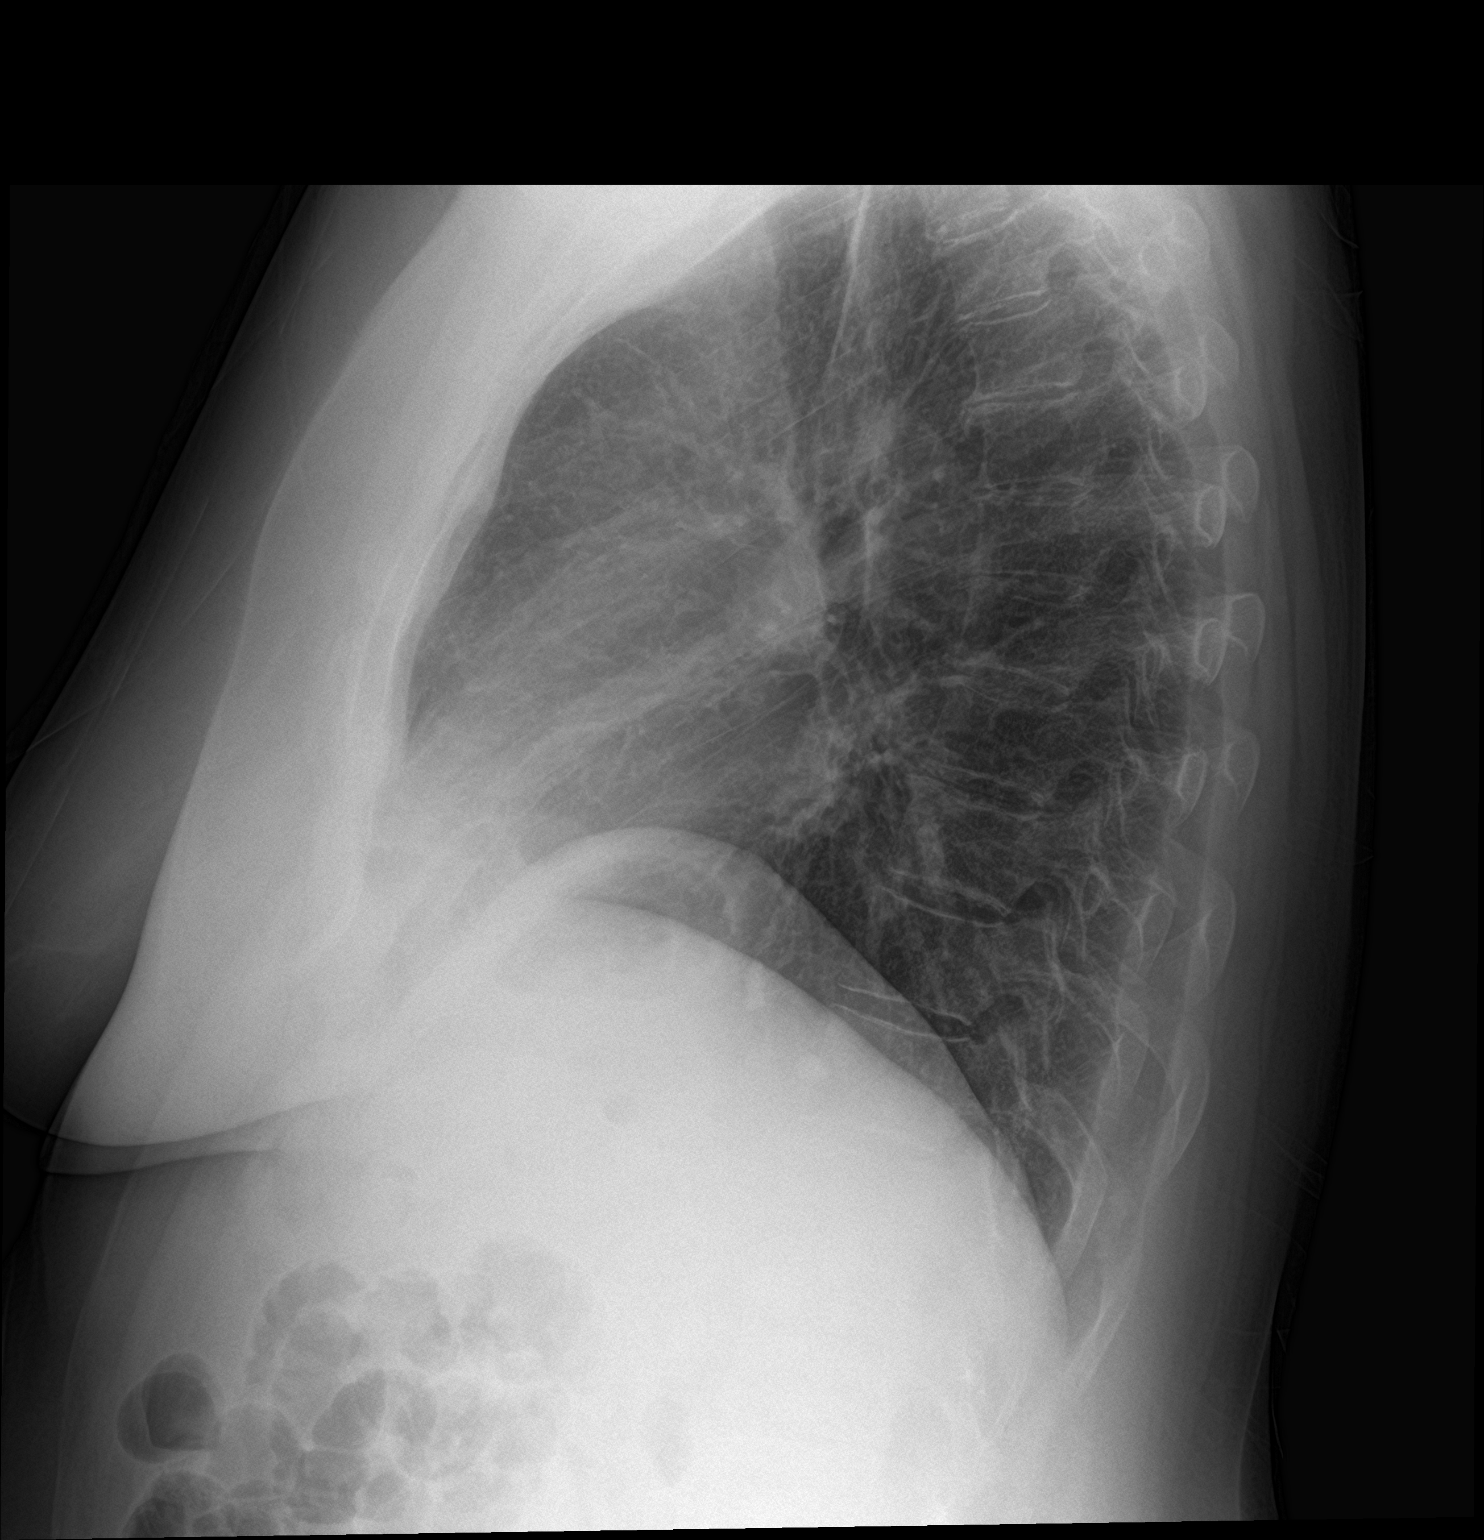

[2 of 2 positions shown; findings below may reference images not displayed]

FINDINGS: Heart size is normal. Mediastinal shadows are normal. There may be
central bronchial thickening, but there is no infiltrate, collapse
or effusion. Bony structures are unremarkable. Calcified granuloma
right lung base as seen previously.
IMPRESSION: Possible bronchitis.  No consolidation or collapse.

## 2019-02-18 IMAGING — DX DG NECK SOFT TISSUE
2 series · 2 of 2 positions shown · non-contrast
Comparison: MR 10/12/2017

CLINICAL DATA: Productive cough, loss of voice x2 days

EXAM:
NECK SOFT TISSUES - 1+ VIEW

[neck ap]
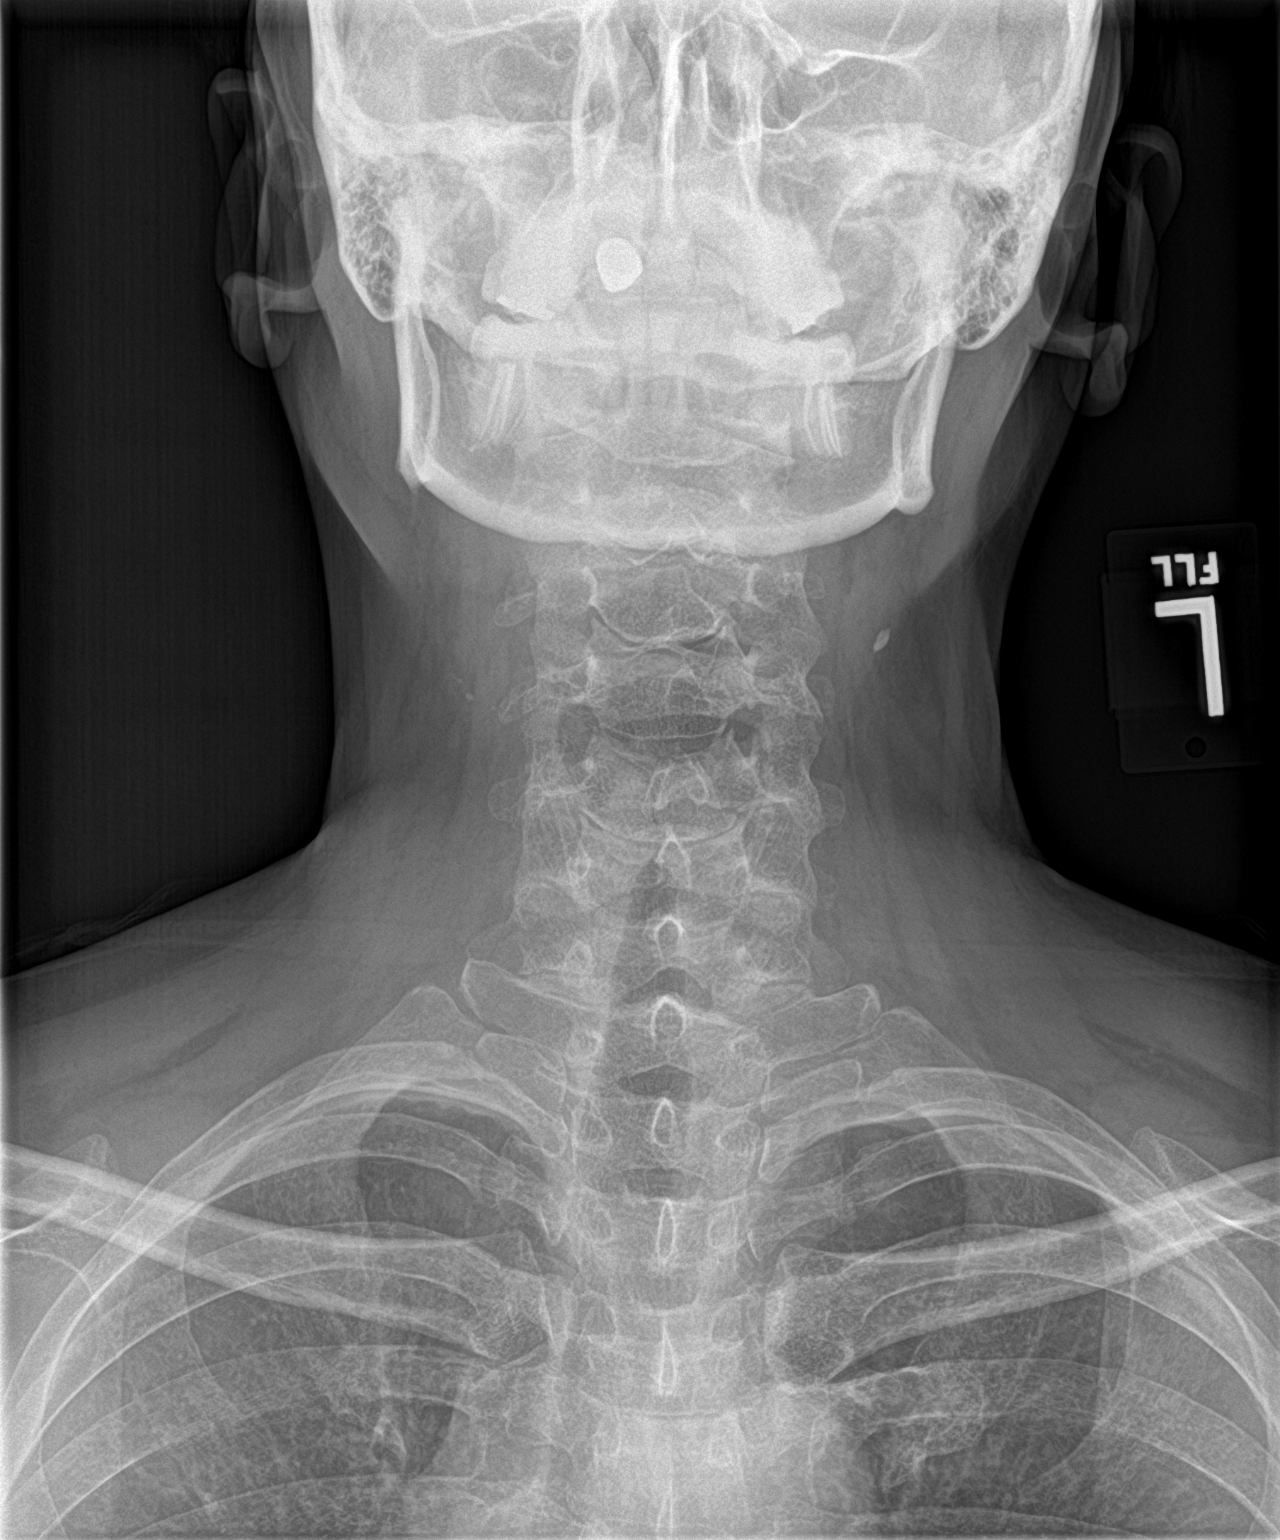

[neck lat]
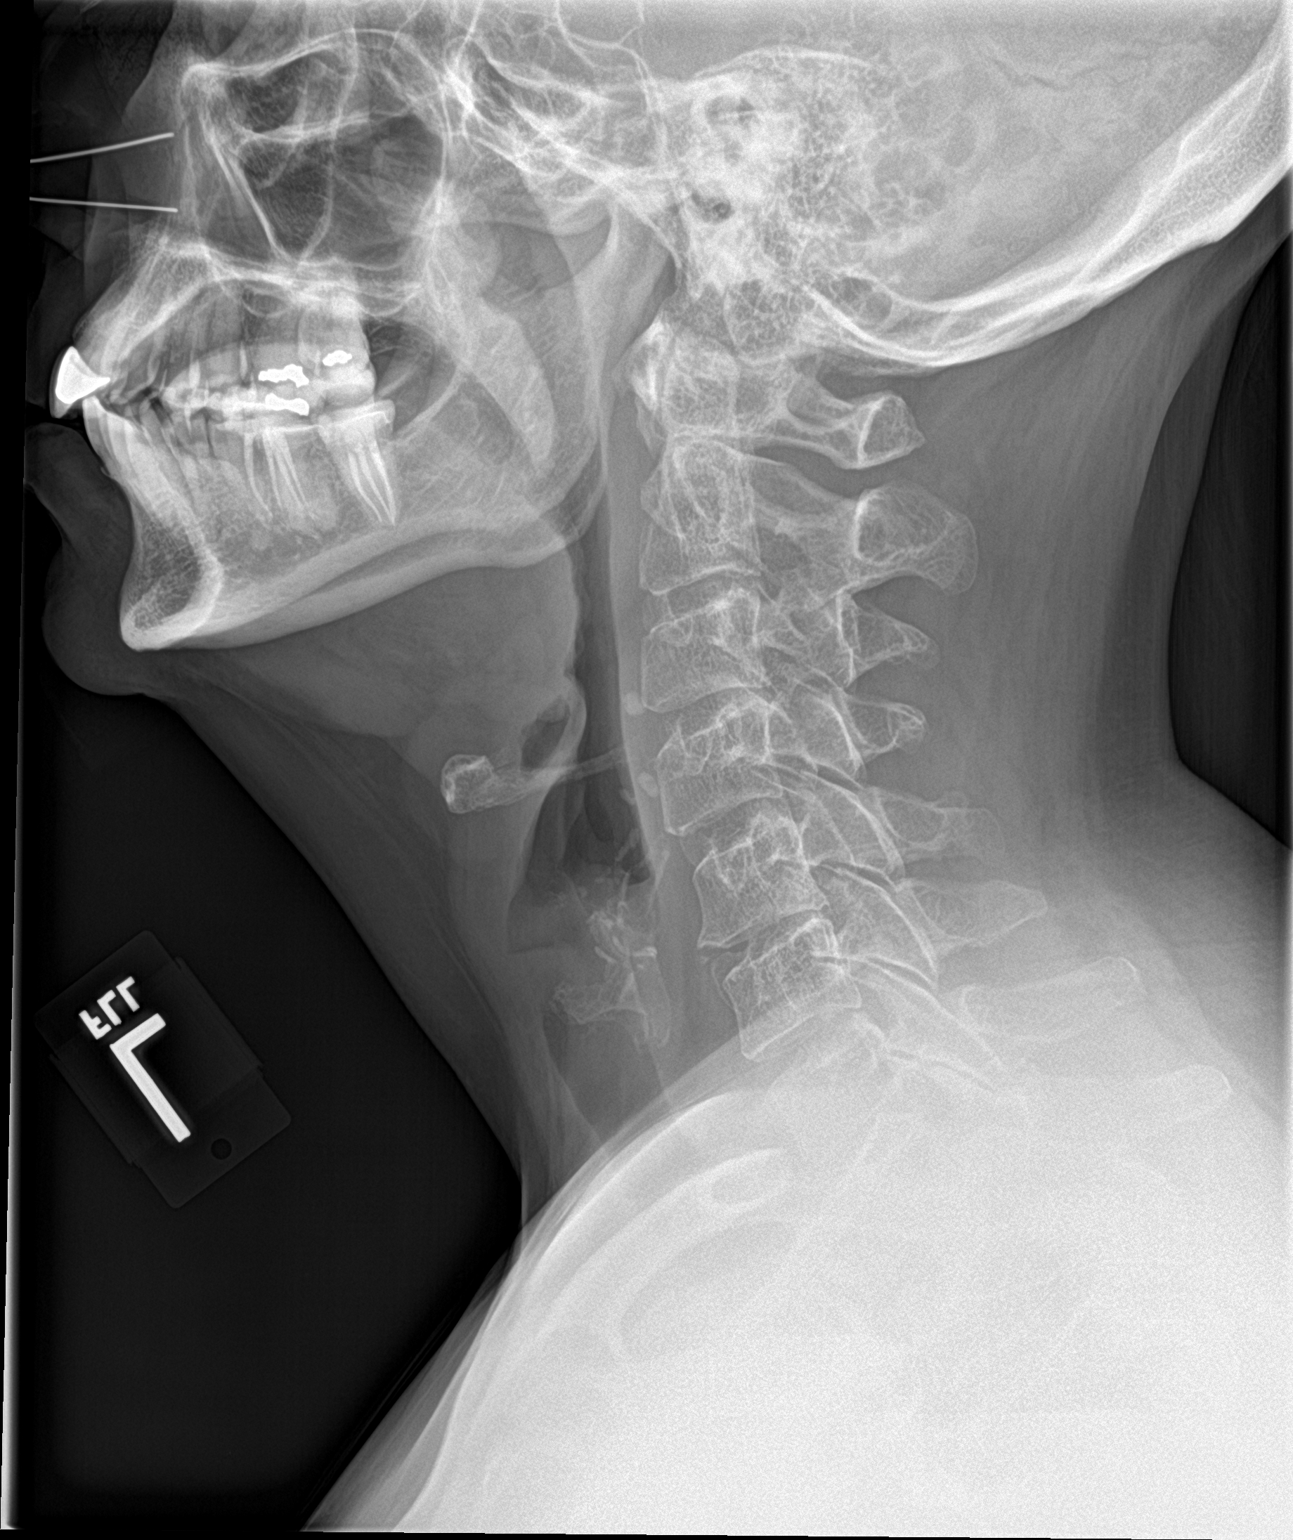

[2 of 2 positions shown; findings below may reference images not displayed]

FINDINGS: There is no evidence of retropharyngeal soft tissue swelling or
epiglottic enlargement. The cervical airway is unremarkable and no
radio-opaque foreign body identified. Bilateral carotid bulb
calcifications. Multiple dental restorations.
IMPRESSION: 1. No acute findings.
2. Bilateral calcified carotid bulb plaque.

## 2019-02-21 ENCOUNTER — Other Ambulatory Visit: Payer: Self-pay | Admitting: Family Medicine

## 2019-02-21 DIAGNOSIS — R05 Cough: Secondary | ICD-10-CM

## 2019-02-21 DIAGNOSIS — R059 Cough, unspecified: Secondary | ICD-10-CM

## 2019-02-23 ENCOUNTER — Ambulatory Visit (INDEPENDENT_AMBULATORY_CARE_PROVIDER_SITE_OTHER): Payer: Medicaid Other | Admitting: Orthopaedic Surgery

## 2019-02-23 ENCOUNTER — Other Ambulatory Visit: Payer: Self-pay

## 2019-02-23 ENCOUNTER — Encounter: Payer: Self-pay | Admitting: Orthopaedic Surgery

## 2019-02-23 VITALS — Ht 61.0 in | Wt 199.0 lb

## 2019-02-23 DIAGNOSIS — M65311 Trigger thumb, right thumb: Secondary | ICD-10-CM

## 2019-02-23 DIAGNOSIS — G5601 Carpal tunnel syndrome, right upper limb: Secondary | ICD-10-CM

## 2019-02-23 NOTE — Progress Notes (Signed)
53 year old female returns post right carpal tunnel release right trigger thumb release on 01/16/2019 she has had some erythema of her incision no drainage.  She has had some tenderness when she opens large jars uses her hand she has had some soreness and tenderness.  No triggering of the thumb.  She is concerned she had more soreness at the incision than she expected and return to have it checked.  No thenar atrophy.  Sensation of the fingertips are improved median distribution.  She can apply just plain lotion use Tylenol or ibuprofen for pain use her splint intermittently when she is doing heavy activities such as emptying the washing machine making beds etc.  I discussed with her that her symptoms should gradually resolve.  Follow-up as needed.

## 2019-03-03 ENCOUNTER — Other Ambulatory Visit: Payer: Self-pay | Admitting: Family Medicine

## 2019-03-10 IMAGING — DX DG CHEST 2V
2 series · 2 of 2 positions shown · non-contrast
Comparison: PA and lateral chest x-ray November 09, 2017

CLINICAL DATA: Left back pain radiating into the left breast with
increased severity over night. History of cough for the past 2-3
weeks attributed to bronchitis. Former smoker.

EXAM:
CHEST - 2 VIEW

[chest pa]
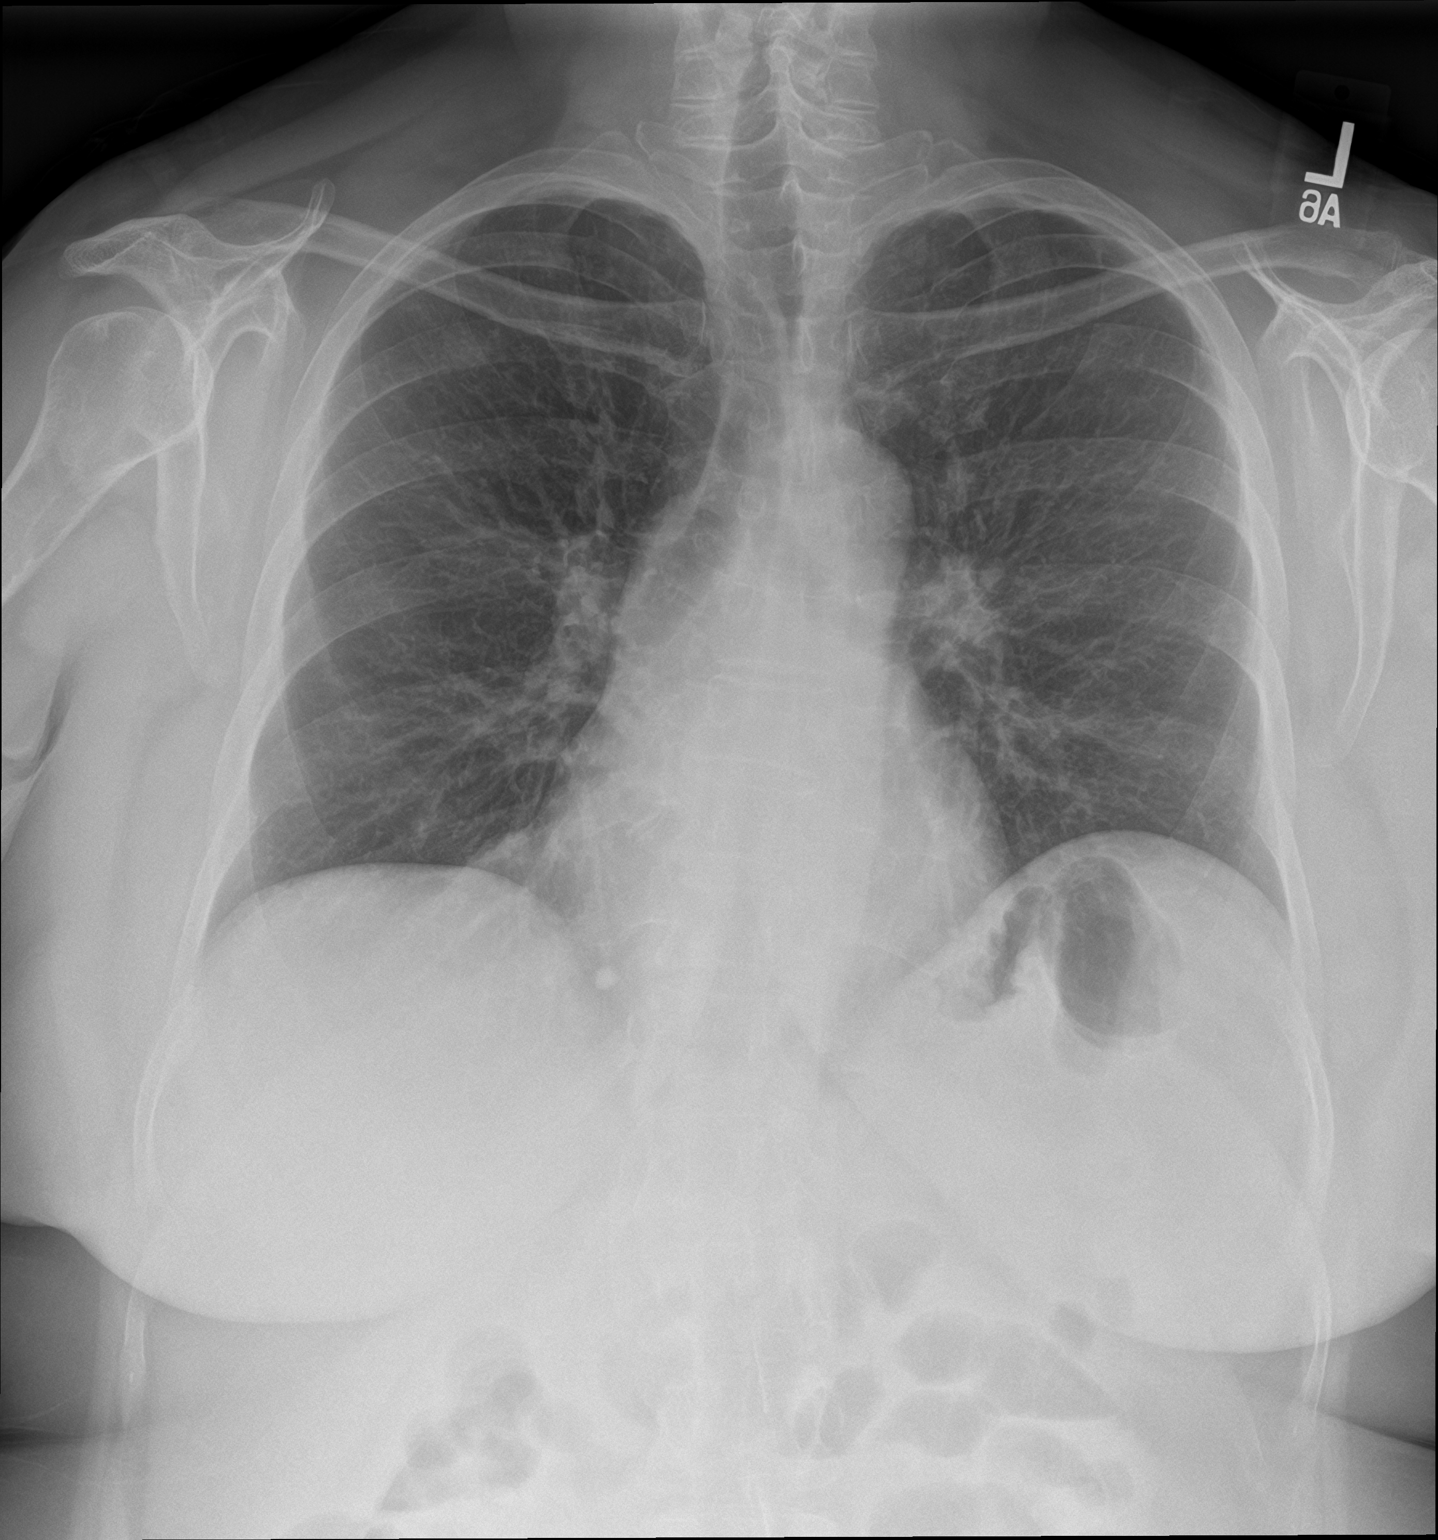

[chest lat]
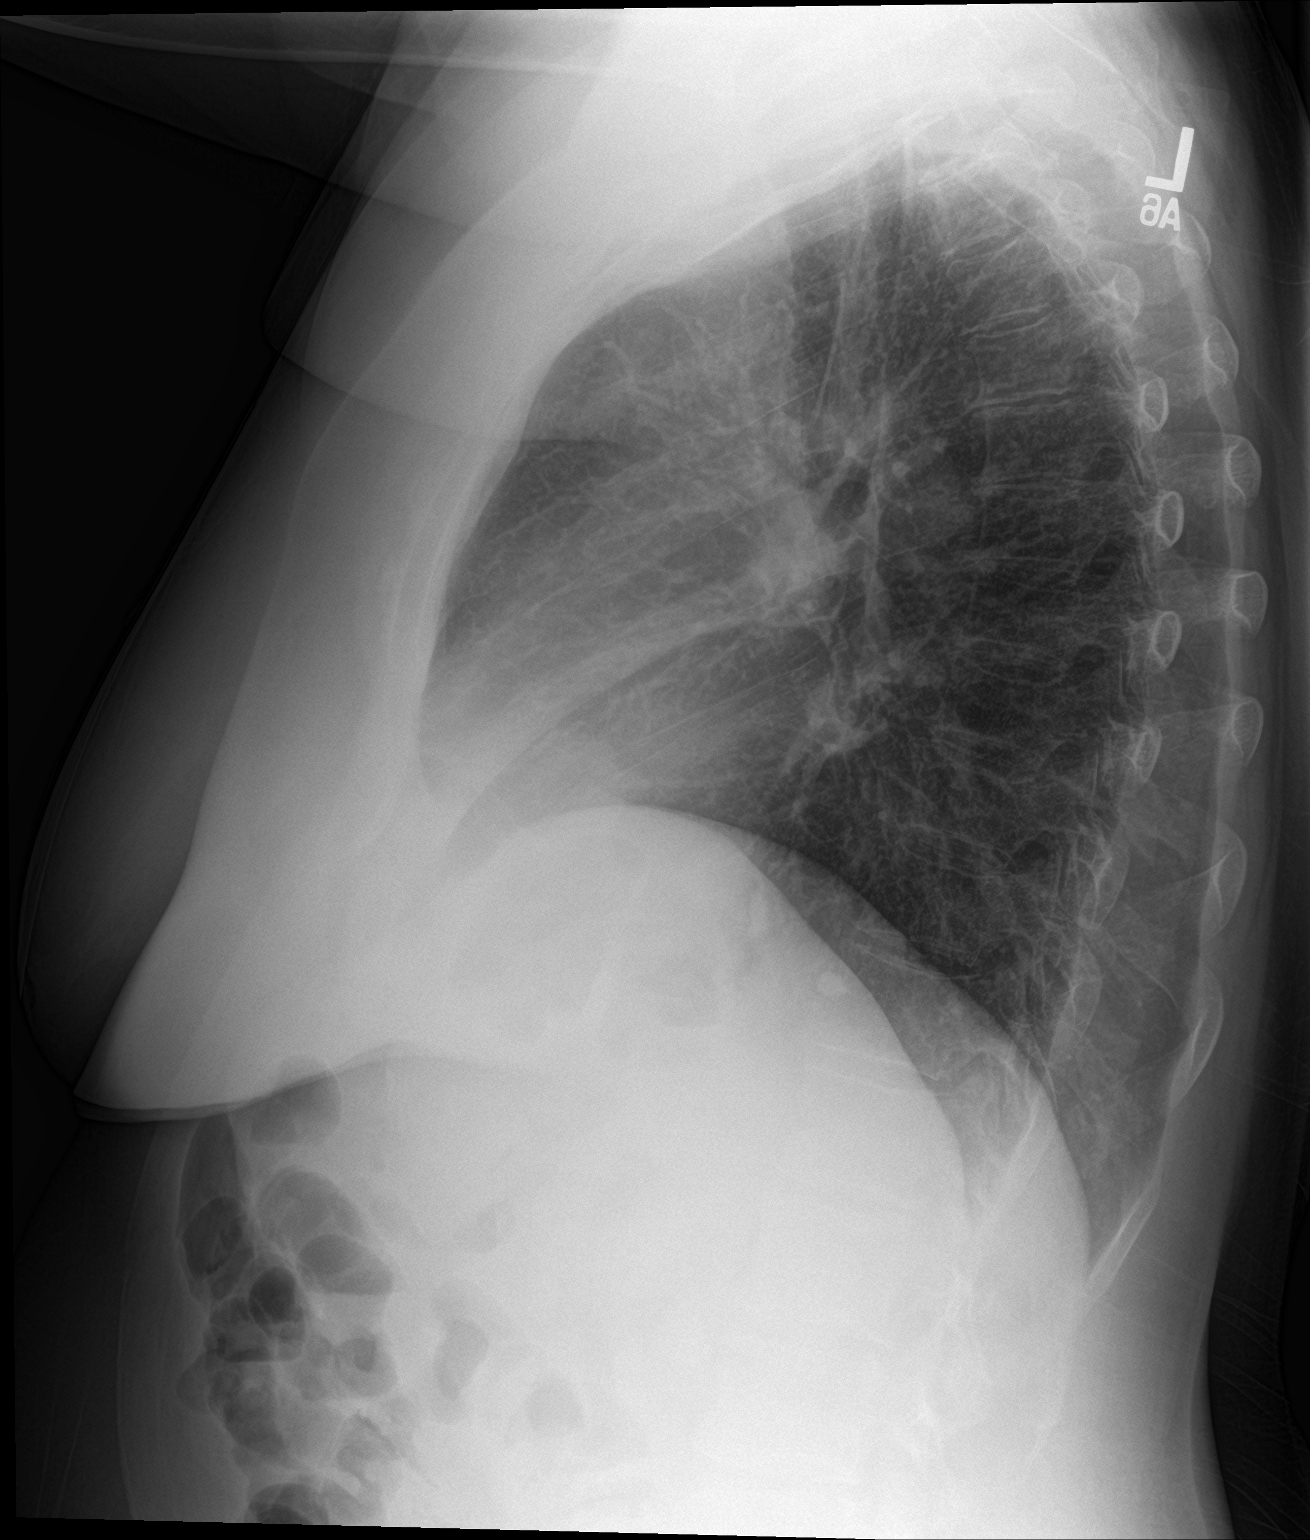

[2 of 2 positions shown; findings below may reference images not displayed]

FINDINGS: The lungs are adequately inflated. The interstitial markings are
coarse though stable. There is an approximately 5 mm diameter nodule
projecting in the medial costovertebral gutter on the right which is
stable. The heart and pulmonary vascularity are normal. The
mediastinum is normal in width. The bony thorax exhibits no acute
abnormality.
IMPRESSION: Chronic bronchitic changes.  No alveolar pneumonia nor CHF.

Previous granulomatous infection.

## 2019-03-12 ENCOUNTER — Other Ambulatory Visit: Payer: Self-pay | Admitting: Family Medicine

## 2019-03-29 ENCOUNTER — Other Ambulatory Visit: Payer: Self-pay | Admitting: Family Medicine

## 2019-04-03 ENCOUNTER — Other Ambulatory Visit: Payer: Self-pay

## 2019-04-03 MED ORDER — CYCLOBENZAPRINE HCL 10 MG PO TABS: 10.0000 mg | ORAL_TABLET | Freq: Three times a day (TID) | ORAL | 0 refills | Status: DC | PRN

## 2019-04-03 NOTE — Telephone Encounter (Signed)
Patient calls nurse line stating she needs a refill on her Flexeril. Patient is requesting a 90 day supply.

## 2019-04-13 DIAGNOSIS — M47812 Spondylosis without myelopathy or radiculopathy, cervical region: Secondary | ICD-10-CM | POA: Diagnosis not present

## 2019-04-13 DIAGNOSIS — M9901 Segmental and somatic dysfunction of cervical region: Secondary | ICD-10-CM | POA: Diagnosis not present

## 2019-04-20 ENCOUNTER — Other Ambulatory Visit: Payer: Self-pay

## 2019-04-20 ENCOUNTER — Ambulatory Visit (INDEPENDENT_AMBULATORY_CARE_PROVIDER_SITE_OTHER): Payer: Medicare HMO | Admitting: Orthopaedic Surgery

## 2019-04-20 VITALS — BP 109/73 | HR 106 | Ht 61.0 in | Wt 195.0 lb

## 2019-04-20 DIAGNOSIS — G5601 Carpal tunnel syndrome, right upper limb: Secondary | ICD-10-CM | POA: Diagnosis not present

## 2019-04-20 DIAGNOSIS — M65311 Trigger thumb, right thumb: Secondary | ICD-10-CM

## 2019-04-21 ENCOUNTER — Encounter: Payer: Self-pay | Admitting: Orthopaedic Surgery

## 2019-04-21 DIAGNOSIS — M47812 Spondylosis without myelopathy or radiculopathy, cervical region: Secondary | ICD-10-CM | POA: Diagnosis not present

## 2019-04-21 DIAGNOSIS — M9901 Segmental and somatic dysfunction of cervical region: Secondary | ICD-10-CM | POA: Diagnosis not present

## 2019-04-21 DIAGNOSIS — G5601 Carpal tunnel syndrome, right upper limb: Secondary | ICD-10-CM | POA: Diagnosis not present

## 2019-04-24 NOTE — Progress Notes (Signed)
Office Visit Note   Patient: Laura Burnett           Date of Birth: 1966-02-29           MRN: [id number] Visit Date: 04/20/2019              Requested by: Guadalupe Dawn, MD [phone],  [place] [address] PCP: Guadalupe Dawn, MD   Assessment & Plan: Visit Diagnoses:  1. Trigger thumb, right thumb   2. Carpal tunnel syndrome, right upper limb     Plan: Postop surgical correction of above problem 01/16/2019.  He still has some tenderness over the scar at the base of the thumb but no catching.  She can apply lotion to the area.  She has been wearing the wrist splint which unfortunately strap rubs directly over the scar over the A1 pulley and we discussed avoiding using the splint since it may be aggravating her scar at the base of the thumb.  She can continue to rub this and desensitize it apply lotion twice a day and return as needed.  Follow-Up Instructions: No follow-ups on file.   Orders:  No orders of the defined types were placed in this encounter.  No orders of the defined types were placed in this encounter.     Procedures: No procedures performed   Clinical Data: No additional findings.   Subjective: Chief Complaint  Patient presents with   Right Hand - Pain, Follow-up    01/16/2019 Right CTR, Right Trigger Thumb Release    HPI 53 year old female returns post right thumb trigger finger release on 01/16/2019 and also right carpal tunnel release at that time.  She has been using Advil gabapentin.  She states she has had some prominence over the scar of the trigger thumb release but is not had any recurrent triggering.  At times when she grabs objects it feels like it is tender and she has pain at the scar.  Scar is prominent if she hyperextends the MP joint since the scar is on the palmar surface.  Patient states her back is doing well after July 2019 L5-S1 bilateral lateral fusion with iliac crest bone graft after previous instrumented fusion by Dr.  Cyndy Freeze in 2018 with pseudoarthrosis.  Review of Systems is systems updated unchanged since her May 2020 surgery procedure other than mentioned in HPI.   Objective: Vital Signs: BP 109/73    Pulse (!) 106    Ht 5\' 1"  (1.549 m)    Wt 195 lb (88.5 kg)    BMI 36.84 kg/m   Physical Exam Constitutional:      Appearance: She is well-developed.  HENT:     Head: Normocephalic.     Right Ear: External ear normal.     Left Ear: External ear normal.  Eyes:     Pupils: Pupils are equal, round, and reactive to light.  Neck:     Thyroid: No thyromegaly.     Trachea: No tracheal deviation.  Cardiovascular:     Rate and Rhythm: Normal rate.  Pulmonary:     Effort: Pulmonary effort is normal.  Abdominal:     Palpations: Abdomen is soft.  Skin:    General: Skin is warm and dry.  Neurological:     Mental Status: She is alert and oriented to person, place, and time.  Psychiatric:        Behavior: Behavior normal.     Ortho Exam carpal tunnel incisions well-healed.  Incision over base  of the thumb transverse is healed and nodule over the flexor tendon is noted proximal incision but she is unable to demonstrate any triggering or catching.  Thenar strength is good.  Specialty Comments:  No specialty comments available.  Imaging: No results found.   PMFS History: Patient Active Problem List   Diagnosis Date Noted   Fall 02/10/2019   Carpal tunnel syndrome, right upper limb 12/16/2018   Trigger thumb, right thumb 05/26/2018   Lumbar pain 03/07/2018   S/P lumbar fusion 02/07/2018   Viral warts [id number]   Folliculitis [id number]   Cough 11/30/2017   Encounter to establish care 11/21/2017   Lumbar back pain 10/22/2017   Adjustment disorder with depressed mood 05/24/2017   Hoarseness 04/21/2017   Anxiety disorder 03/16/2017   Lumbar radiculopathy 12/21/2016   Neck pain 12/20/2015   Hypothyroidism    Past Medical History:  Diagnosis Date   Anxiety    Arthritis     Asthma    as a child " exercise induced"   Bronchitis    Constipation    Depression    DJD (degenerative joint disease)    Fibromyalgia    GERD (gastroesophageal reflux disease)    Headache    Hypothyroidism    Obesity    PONV (postoperative nausea and vomiting)    Wears glasses     Family History  Problem Relation Age of Onset   Thyroid disease Sister    Thyroid disease Brother    Alzheimer's disease Mother    Diabetes Mother    Heart attack Father    Colon cancer Father    Heart failure Father    Cancer Brother    Cancer Brother     Past Surgical History:  Procedure Laterality Date   ABDOMINAL HYSTERECTOMY     ADENOIDECTOMY     APPLICATION OF ROBOTIC ASSISTANCE FOR SPINAL PROCEDURE N/A 12/21/2016   Procedure: APPLICATION OF ROBOTIC ASSISTANCE FOR SPINAL PROCEDURE;  Surgeon: Kevan Ny Ditty, MD;  Location: [facility];  Service: Neurosurgery;  Laterality: N/A;   BACK SURGERY     L5-S1   CARPAL TUNNEL RELEASE Right 01/16/2019   Procedure: RIGHT CARPAL TUNNEL RELEASE;  Surgeon: [name], MD;  Location: [facility];  Service: Orthopedics;  Laterality: Right;   CYST EXCISION     uterine ;subsequent sx followed   FOOT SURGERY     HAND SURGERY     from AA   KNEE ARTHROSCOPY     x2   TONSILLECTOMY     TRIGGER FINGER RELEASE Right 01/16/2019   Procedure: RIGHT TRIGGER THUMB RELEASE;  Surgeon: [name], MD;  Location: [facility];  Service: Orthopedics;  Laterality: Right;   Social History   Occupational History   Not on file  Tobacco Use   Smoking status: Former Smoker   Smokeless tobacco: Never Used   Tobacco comment: stopped smoking cigarettes at age 61  Substance and Sexual Activity   Alcohol use: Yes    Comment: occasional beer   Drug use: No    Comment:  per pt when she was 16 year   Sexual activity: Not Currently

## 2019-04-25 ENCOUNTER — Other Ambulatory Visit: Payer: Self-pay | Admitting: Family Medicine

## 2019-05-03 DIAGNOSIS — N6489 Other specified disorders of breast: Secondary | ICD-10-CM | POA: Diagnosis not present

## 2019-05-03 DIAGNOSIS — N6081 Other benign mammary dysplasias of right breast: Secondary | ICD-10-CM | POA: Diagnosis not present

## 2019-05-03 DIAGNOSIS — L989 Disorder of the skin and subcutaneous tissue, unspecified: Secondary | ICD-10-CM | POA: Diagnosis not present

## 2019-05-04 ENCOUNTER — Other Ambulatory Visit: Payer: Self-pay | Admitting: Family Medicine

## 2019-05-09 ENCOUNTER — Ambulatory Visit (INDEPENDENT_AMBULATORY_CARE_PROVIDER_SITE_OTHER): Payer: Medicare HMO | Admitting: Family Medicine

## 2019-05-09 ENCOUNTER — Other Ambulatory Visit: Payer: Self-pay

## 2019-05-09 ENCOUNTER — Encounter: Payer: Self-pay | Admitting: Family Medicine

## 2019-05-09 VITALS — BP 122/80 | HR 93 | Wt 200.6 lb

## 2019-05-09 DIAGNOSIS — L304 Erythema intertrigo: Secondary | ICD-10-CM | POA: Diagnosis not present

## 2019-05-09 DIAGNOSIS — B372 Candidiasis of skin and nail: Secondary | ICD-10-CM | POA: Insufficient documentation

## 2019-05-09 DIAGNOSIS — Z23 Encounter for immunization: Secondary | ICD-10-CM | POA: Diagnosis not present

## 2019-05-09 MED ORDER — KETOCONAZOLE 2 % EX CREA: 1.0000 "application " | TOPICAL_CREAM | Freq: Every day | CUTANEOUS | 0 refills | Status: DC

## 2019-05-09 NOTE — Patient Instructions (Addendum)
Wonderful to see you today.  I believe that the rash is likely yeast/fungal in nature.  I have sent in ketoconazole cream for you to use once daily for the next 2-3 weeks.  Additionally try to keep this area dry as possible, wear cotton clothing nothing too tight.  Try to use a gentle soap, non-fragrance Hulan Fray is a great one.  Also working towards a well-balanced diet and activity as you can will be additionally beneficial.  Please follow-up in the next few weeks if this is not improving, or sooner if worsening including spreading of the area, or development of lesions, fever, drainage etc.--we can reevaluate at that time.

## 2019-05-09 NOTE — Progress Notes (Signed)
   Subjective:    Patient ID: Laura Burnett, female    DOB: [birth year], 53 y.o.   MRN: [id number]   CC: boils on inner thigh  HPI: Laura Burnett is a 53 year old female with a history of folliculitis presenting discuss the following:  Boils: Present for the past 3-4 days in her groin area, endorses numerous areas of tiny "welps". Associated redness/rash on skin in the folds.  Area is somewhat sore, sometimes itchy. Has a history of these developing, but hasn't had anything like this in years. Denies have other lesions in her axillary or breast areas. Denies any associated fever or drainage. Has been trying to keep the area dry. Hasn't used warm compresses.  No new lotions or soaps, however recently switched to gain detergent from [employer] approximately 1 week ago.  Denies being sexually active, changes in vaginal discharge, vaginal itching/irritation.  Smoking status reviewed  Review of Systems Per HPI, also denies recent illness, fever, headache, changes in vision, chest pain, shortness of breath, abdominal pain, N/V/D, weakness   Patient Active Problem List   Diagnosis Date Noted  . Candidal intertrigo 05/09/2019  . Fall 02/10/2019  . Carpal tunnel syndrome, right upper limb 12/16/2018  . Trigger thumb, right thumb 05/26/2018  . Lumbar pain 03/07/2018  . S/P lumbar fusion 02/07/2018  . Viral warts 12/16/2017  . Folliculitis [id number]  . Cough 11/30/2017  . Encounter to establish care 11/21/2017  . Lumbar back pain 10/22/2017  . Adjustment disorder with depressed mood 05/24/2017  . Hoarseness 04/21/2017  . Anxiety disorder 03/16/2017  . Lumbar radiculopathy 12/21/2016  . Neck pain 12/20/2015  . Hypothyroidism      Objective:  BP 122/80   Pulse 93   Wt 200 lb 9.6 oz (91 kg)   SpO2 97%   BMI 37.90 kg/m  Vitals and nursing note reviewed  General: NAD, pleasant Cardiac: RRR, normal heart sounds Respiratory: CTAB, normal effort Extremities: no edema or cyanosis. WWP. Skin/GU:  warm and dry, area of coalesced erythematous rash within groin skin folds, mild hyper pigmentation on the periphery with scattered satellite erythematous papules.  No drainage noted. Neuro: alert and oriented, no focal deficits Psych: normal affect  Assessment & Plan:   Candidal intertrigo 2-day history of erythematous rash within groin folds associated with satellite papules on the periphery.  Suspect secondary to candidal intertrigo, however could also consider tinea cruris, contact dermatitis given recent transition of detergent, vs erythrasma. No regions suggestive of cyst/abscess formation requiring drainage or signs concerning for secondary bacterial infection. -Will trial ketoconazole cream, daily for approximately 2-3 weeks - Supportive care including keeping area dry, gentle soap such as Dove, and avoiding tight clothing -Counseled on well-balanced diet and physical activity towards decreasing BMI - Return precautions discussed, including if rash worsening, development of fever/drainage, enlargement of papules - Follow-up if not improving in the next 2 weeks or sooner if worsening   Received her flu shot today.  [facility] Medicine Resident PGY-2

## 2019-05-09 NOTE — Assessment & Plan Note (Signed)
2-day history of erythematous rash within groin folds associated with satellite papules on the periphery.  Suspect secondary to candidal intertrigo, however could also consider tinea cruris, contact dermatitis given recent transition of detergent, vs erythrasma. No regions suggestive of cyst/abscess formation requiring drainage or signs concerning for secondary bacterial infection. -Will trial ketoconazole cream, daily for approximately 2-3 weeks - Supportive care including keeping area dry, gentle soap such as Dove, and avoiding tight clothing -Counseled on well-balanced diet and physical activity towards decreasing BMI - Return precautions discussed, including if rash worsening, development of fever/drainage, enlargement of papules - Follow-up if not improving in the next 2 weeks or sooner if worsening

## 2019-05-11 NOTE — Telephone Encounter (Signed)
Medication wasn't received electronically.  Verbal order given via telephone to [place] at [facility].  Jazmin Hartsell,CMA

## 2019-06-08 ENCOUNTER — Other Ambulatory Visit: Payer: Self-pay | Admitting: Family Medicine

## 2019-06-08 ENCOUNTER — Encounter: Payer: Self-pay | Admitting: Family Medicine

## 2019-06-09 ENCOUNTER — Other Ambulatory Visit: Payer: Self-pay | Admitting: Family Medicine

## 2019-06-14 ENCOUNTER — Encounter: Payer: Self-pay | Admitting: Family Medicine

## 2019-06-14 ENCOUNTER — Ambulatory Visit (INDEPENDENT_AMBULATORY_CARE_PROVIDER_SITE_OTHER): Payer: Medicare HMO | Admitting: Family Medicine

## 2019-06-14 ENCOUNTER — Other Ambulatory Visit: Payer: Self-pay

## 2019-06-14 DIAGNOSIS — S0990XA Unspecified injury of head, initial encounter: Secondary | ICD-10-CM | POA: Diagnosis not present

## 2019-06-14 NOTE — Patient Instructions (Signed)
It was great seeing you again today!  I am sorry about your fall and hitting the back of your head.  I think he did have a pretty moderate concussion based on your symptoms.  Fortunately 3 days out and with improvement I do not think we need to get any imaging or further work-up.  I will give you some information on concussions but typically you would want to restart activity gradually and advance it slowly.  He can continue to take Tylenol as needed for your headaches and I would continue applying ice packs to that big bump.  I would like for you to check in with me on Friday or so, he can either let the clinic know or message me through my chart.  If you have any new symptoms or symptoms fail to continue improving please let me know.

## 2019-06-16 ENCOUNTER — Encounter: Payer: Self-pay | Admitting: Family Medicine

## 2019-06-20 ENCOUNTER — Encounter: Payer: Self-pay | Admitting: Family Medicine

## 2019-06-20 DIAGNOSIS — S0990XA Unspecified injury of head, initial encounter: Secondary | ICD-10-CM | POA: Insufficient documentation

## 2019-06-20 NOTE — Assessment & Plan Note (Signed)
Head bruise due to impact of head against a wooden door frame. Patient with some subjective headache and mild photophobia. The patient may have sustained a mild concussion after the fall and the head trauma. The symptoms of headache are slowly improving the light is not bother her as much anymore. Gave return to activity precautions and schedule to patient. Discussed alarm symptoms and when to follow up. I do not think advanced head imaging is needed at this point given the mild symptoms, improvement in those symptoms, and lack of objective neuro findings. - follow up prn - gave return precautions - tylenol for headache

## 2019-06-20 NOTE — Progress Notes (Signed)
   HPI 52 year old who presents 2/2 hitting her head. The patient was apparently sitting in an antique chair when the legs gave way and she fell backward into a door frame. This occurred approximately 5 days prior to the clinic visit. Since that time the patient has had a painful bump on her posterior scalp. The patient complains of subjective headache, intermittently since the fall.  The patient does not complain of any vision or sensory nerve changes. She does not feel like her field of vision has been affected in any way. She states that tylenol has helped her headache. She does state that bright lights have hurt her eyes since the fall. She has had no nausea or vomiting since the fall.  The spot of impact initially hurt her head quite a bit, but this has improved over time. She feels like the swelling has improved.  CC: fall follow up  ROS:  Review of Systems See HPI for ROS.   CC, SH/smoking status, and VS noted  Objective: BP 120/80   Pulse (!) 103   SpO2 51%  Gen: 53 year old caucasian female, no acute distress, comfortable HEENT: eomi, perrla. No field of vision deficits. No cervical lymphadenopathy. Mild bruising noted on posterior scalp. Very minimal surrounding swelling. CV: RRR, no murmur Resp: CTAB, no wheezes, non-labored Abd: SNTND, BS present, no guarding or organomegaly Neuro: cn 2-12 intact, 5/5 strength all muscle groups BUE, BLE. No gait abnormality. No focal neuro deficit.  Vision: 20/30 R and L with glasses on. Consistent with baseline   Assessment and plan:  Head trauma Head bruise due to impact of head against a wooden door frame. Patient with some subjective headache and mild photophobia. The patient may have sustained a mild concussion after the fall and the head trauma. The symptoms of headache are slowly improving the light is not bother her as much anymore. Gave return to activity precautions and schedule to patient. Discussed alarm symptoms and when to  follow up. I do not think advanced head imaging is needed at this point given the mild symptoms, improvement in those symptoms, and lack of objective neuro findings. - follow up prn - gave return precautions - tylenol for headache   No orders of the defined types were placed in this encounter.   No orders of the defined types were placed in this encounter.    Guadalupe Dawn MD PGY-3 Family Medicine Resident  06/20/2019 3:55 PM

## 2019-06-24 ENCOUNTER — Other Ambulatory Visit: Payer: Self-pay | Admitting: Family Medicine

## 2019-06-26 ENCOUNTER — Encounter: Payer: Self-pay | Admitting: Family Medicine

## 2019-06-26 ENCOUNTER — Other Ambulatory Visit: Payer: Self-pay | Admitting: Family Medicine

## 2019-06-26 DIAGNOSIS — B372 Candidiasis of skin and nail: Secondary | ICD-10-CM

## 2019-06-26 MED ORDER — FLUCONAZOLE 150 MG PO TABS: 150.0000 mg | ORAL_TABLET | ORAL | 0 refills | Status: DC

## 2019-06-26 NOTE — Progress Notes (Signed)
Sending in diflucan 150mg  weekly for 4 weeks given intertrigo refractory to topical therapy.   Patient to follow up in the clinic if no improvement with oral therapy for re-evaluation.   Patriciaann Clan, DO

## 2019-07-04 ENCOUNTER — Other Ambulatory Visit: Payer: Self-pay | Admitting: Family Medicine

## 2019-07-05 ENCOUNTER — Other Ambulatory Visit: Payer: Self-pay | Admitting: *Deleted

## 2019-07-06 MED ORDER — CYCLOBENZAPRINE HCL 10 MG PO TABS: 10.0000 mg | ORAL_TABLET | Freq: Three times a day (TID) | ORAL | 0 refills | Status: DC | PRN

## 2019-07-07 ENCOUNTER — Encounter: Payer: Self-pay | Admitting: Family Medicine

## 2019-07-10 ENCOUNTER — Encounter: Payer: Self-pay | Admitting: Family Medicine

## 2019-07-12 ENCOUNTER — Other Ambulatory Visit: Payer: Self-pay | Admitting: Family Medicine

## 2019-07-13 ENCOUNTER — Other Ambulatory Visit: Payer: Self-pay | Admitting: *Deleted

## 2019-07-17 DIAGNOSIS — Z79899 Other long term (current) drug therapy: Secondary | ICD-10-CM | POA: Diagnosis not present

## 2019-07-17 DIAGNOSIS — R1012 Left upper quadrant pain: Secondary | ICD-10-CM | POA: Diagnosis not present

## 2019-07-17 DIAGNOSIS — R109 Unspecified abdominal pain: Secondary | ICD-10-CM | POA: Diagnosis not present

## 2019-07-21 ENCOUNTER — Ambulatory Visit (INDEPENDENT_AMBULATORY_CARE_PROVIDER_SITE_OTHER): Payer: Medicare HMO | Admitting: Family Medicine

## 2019-07-21 ENCOUNTER — Telehealth: Payer: Self-pay | Admitting: Radiology

## 2019-07-21 ENCOUNTER — Encounter: Payer: Self-pay | Admitting: Family Medicine

## 2019-07-21 ENCOUNTER — Other Ambulatory Visit: Payer: Self-pay

## 2019-07-21 VITALS — BP 98/70 | HR 98 | Wt 197.2 lb

## 2019-07-21 DIAGNOSIS — K259 Gastric ulcer, unspecified as acute or chronic, without hemorrhage or perforation: Secondary | ICD-10-CM

## 2019-07-21 DIAGNOSIS — R1013 Epigastric pain: Secondary | ICD-10-CM | POA: Insufficient documentation

## 2019-07-21 DIAGNOSIS — B372 Candidiasis of skin and nail: Secondary | ICD-10-CM | POA: Diagnosis not present

## 2019-07-21 HISTORY — DX: Epigastric pain: R10.13

## 2019-07-21 MED ORDER — OMEPRAZOLE 40 MG PO CPDR: 40.0000 mg | DELAYED_RELEASE_CAPSULE | Freq: Every day | ORAL | 0 refills | Status: DC

## 2019-07-21 NOTE — Assessment & Plan Note (Signed)
Patient reports continued presence of rash not improved with previous ketoconazole and oral Diflucan trial.  Did not have a chance during today's appointment to evaluate this area again, will have her come back in the next several weeks for further evaluation and consider skin biopsy pending clinical exam.

## 2019-07-21 NOTE — Telephone Encounter (Signed)
I called discussed. We had reviewed CT before. Reviewed both and called her. She may have a stomach ulcer which was reason for CT. She can followup in a month or so. Reviewed some loosening of screw. Not in a position where it will give her any problem and no motion on previous motion xrays.  FYi

## 2019-07-21 NOTE — Patient Instructions (Addendum)
Wonderful to see you as always.  In terms of your abdominal pain, I wonder if you may have a gastric ulcer or inflammation of your stomach.  We have done the H. pylori breath test today to make sure that that is not an infection causing irritation in your stomach.  In the meantime I would like you to take the antiacid medication, omeprazole, for at least the next 2 weeks and see if this makes a difference.  If your abdominal pain becomes more severe, development of fever, or if you are keeping yourself hydrated you need to be seen back.  Please follow-up if it is not improving or sooner if worsening.

## 2019-07-21 NOTE — Telephone Encounter (Signed)
Patient called [facility] office and states that she had a CT Abdomen done at [facility] and she received a report that the screw is coming out in her back. Patient would like to discuss.  Please advise.  CB for pt is [phone]

## 2019-07-21 NOTE — Telephone Encounter (Signed)
noted 

## 2019-07-21 NOTE — Progress Notes (Signed)
Subjective:    Patient ID: Laura Burnett, female    DOB: 01-11-1966, 52 y.o.   MRN: [id number]   CC: f/u ED visit, abdominal pain  HPI: Ms. Moman is a 53 year old female with hypothyroidism, s/p lumbar fusion, and anxiety presenting discuss the following:  ED follow-up: 5 days of LUQ abdominal pain like a "bad toothache."  Feels the pain right after she eats something, last for several hours afterwards.  Poor appetite because of this. Will go into left upper flank.  Feels better with putting pressure on that side.  Has had some indigestion with it.  Denies any associated fever, vomiting, dysuria, changes in bowel movements/diarrhea, melena/hematochezia, shortness of breath, coughing, chest pain.  No difference in abdominal pain with movements or activity, no recent increase in exercise regimen.  While the records of her ED visit in [place] are not on our EMR, but she was able to show me the imaging read and her lab results.  Ct abdomen and pelvis unremarkable with exception of mid bladder thickening and divertula without evidence of inflammation.  No evidence of pancreatic inflammation or splenic abnormality. Labs showing Cr 0.84, glucose 88, Alk phos 159, bili 0.3, lipase 26, WBC 4.9, hgb 13.4 U/A unremarkable.  She does not use NSAIDs on a regular basis, nonalcohol drinker.  She was given hydrocodone-acetaminophen 5-3 25 in the ED, has not used this.  Took Tums yesterday with minimal improvement.  Intertrigo: Last seen by myself on 9/8 for inner groin rash felt to be intertrigo, given ketoconazole cream at that time.  She was not having success with this, thus trialed 4 weeks of oral Diflucan weekly.  She states today that she felt like the Diflucan actually made it worse, stopped this.  She is continue to do conservative measurements including keeping these area extremely dry and airing out when possible.   Smoking status reviewed  Review of Systems Per HPI    Objective:  BP 98/70   Pulse  98   Wt 197 lb 4 oz (89.5 kg)   SpO2 96%   BMI 37.27 kg/m  Vitals and nursing note reviewed  General: NAD, pleasant Respiratory: Unlabored breathing Abdomen: soft, nondistended, tender to epigastrium and left upper quadrant without rebounding or guarding, normal active bowel sounds in all 4 quadrants, no hepatosplenomegaly palpated, no masses palpated.  Extremities: no edema or cyanosis. WWP. Skin: warm and dry Neuro: alert and oriented, no focal deficits Psych: normal affect  Assessment & Plan:   Abdominal pain, acute, epigastric Approximate 1 week history of epigastric and LUQ abdominal pain worse with eating, suspect likely in the setting of a gastric ulcer vs gastritis.  Reassuringly had a largely unremarkable CT stone study performed on 11/16 suggesting against pancreatitis, nephrolithiasis, or spleen abnormality.  Additionally her CBC, and BMP were normal at that time as well, with no leukocytosis or fever thus have low suspicion for infection.  Given that the abdominal pain seems to be related to eating rather than with activity, have minimal concern for an atypical anginal presentation.  Will perform an H. pylori breath test today to rule this out as a precipitating factor and start her on a daily PPI for a brief period.  Encouraged continued avoidance of NSAIDs and maintaining hydration.  May follow-up if abdominal pain not improving or sooner if worsening.  Candidal intertrigo Patient reports continued presence of rash not improved with previous ketoconazole and oral Diflucan trial.  Did not have a chance during today's appointment to  evaluate this area again, will have her come back in the next several weeks for further evaluation and consider skin biopsy pending clinical exam.    Follow-up in the neck several weeks for above, sooner if abdominal pain not improving/worsening.  [place] [facility] Resident PGY-2

## 2019-07-21 NOTE — Assessment & Plan Note (Addendum)
Approximate 1 week history of epigastric and LUQ abdominal pain worse with eating, suspect likely in the setting of a gastric ulcer vs gastritis.  Reassuringly had a largely unremarkable CT stone study performed on 11/16 suggesting against pancreatitis, nephrolithiasis, or spleen abnormality.  Additionally her CBC, and BMP were normal at that time as well, with no leukocytosis or fever thus have low suspicion for infection.  Given that the abdominal pain seems to be related to eating rather than with activity, have minimal concern for an atypical anginal presentation.  Will perform an H. pylori breath test today to rule this out as a precipitating factor and start her on a daily PPI for a brief period.  Encouraged continued avoidance of NSAIDs and maintaining hydration.  May follow-up if abdominal pain not improving or sooner if worsening.

## 2019-07-23 LAB — H. PYLORI BREATH TEST: H pylori Breath Test: NEGATIVE

## 2019-07-30 ENCOUNTER — Other Ambulatory Visit: Payer: Self-pay | Admitting: Family Medicine

## 2019-08-07 ENCOUNTER — Ambulatory Visit (INDEPENDENT_AMBULATORY_CARE_PROVIDER_SITE_OTHER): Payer: Medicare HMO | Admitting: Family Medicine

## 2019-08-07 ENCOUNTER — Other Ambulatory Visit: Payer: Self-pay

## 2019-08-07 VITALS — BP 101/70 | HR 102 | Wt 194.0 lb

## 2019-08-07 DIAGNOSIS — R748 Abnormal levels of other serum enzymes: Secondary | ICD-10-CM | POA: Diagnosis not present

## 2019-08-07 DIAGNOSIS — Z800 Family history of malignant neoplasm of digestive organs: Secondary | ICD-10-CM | POA: Diagnosis not present

## 2019-08-07 DIAGNOSIS — R1013 Epigastric pain: Secondary | ICD-10-CM

## 2019-08-07 DIAGNOSIS — R1011 Right upper quadrant pain: Secondary | ICD-10-CM

## 2019-08-07 DIAGNOSIS — Z1211 Encounter for screening for malignant neoplasm of colon: Secondary | ICD-10-CM | POA: Diagnosis not present

## 2019-08-07 MED ORDER — OMEPRAZOLE 40 MG PO CPDR: 40.0000 mg | DELAYED_RELEASE_CAPSULE | Freq: Two times a day (BID) | ORAL | 2 refills | Status: DC

## 2019-08-07 NOTE — Patient Instructions (Addendum)
Please increase your Omeprazole to twice a day. Take Maalox as needed for break through pain. Try to avoid foods that worsen the pain such as acidic foods, large fatty meals.   I have sent in a referral for an upper endoscopy and right upper quadrant ultrasound. Please look out for a call in 1-2 weeks to schedule appointment. We have scheduled you a right upper quadrant ultrasound to further evaluate your gall bladder. I will cal you with the results.  I have obtained more labs to evaluate your pain. I will call you if anything is abnormal.   Take care, Dr. Tarry Kos

## 2019-08-07 NOTE — Progress Notes (Signed)
Subjective:   Patient ID: Laura Burnett    DOB: 1966/06/21, 53 y.o. female   MRN: [id number]  Laura Burnett is a 53 y.o. female with a history of GERD, hypothyroidism, lumbar radiculopathy, depression/anxiety/adjustment disorder here for persistent abdominal pain.  Abdominal Pain: Patient returns today with persistent epigastric pain x ~15month Patient notes pain is worse with eating, especially with certain foods. She notes she isnt able to each much anymore due to the pain. Described as achy, throbbing pain that is sometimes burning or stabbing. Started on Omeprazole 463mQD with minimal improvement. It appears to have only helped the first few days. She notes she has really cut out a lot of foods to minimize symptoms. She notes the symptoms resolve/dont occur with very small bland foods but appears to restart by the following morning. She has not tried anything for the break through pain such as Tums, Pepcid, or Pepto. Per chart review[facility] Plannd was unremarkable with exception of mid bladder thickening and divertula without evidence of inflammation. No evidence of pancreatic inflammation or splenic abnormality. Labs showing Cr 0.84, glucose 88, Alk phos 159, bili 0.3, lipase 26, WBC 4.9, hgb 13.4 U/A unremarkable. Denies chest pain, SOB, or worsening pain with activity. Weight down from 197 to 194lbs. Some difficulty with swallowing at times as things sometimes seem to get stuck at the top of her esophagus. This only occurs with solids, not liquids. Notes normal bowel movements that are brown in color. Denies any melena or hematochezia, vomiting, or hematemesis. Denies any chest pain, SOB, fevers, chills. Patient endorses family history of gastric cancer in her brother at age 8926nd colon cancer in her father at age 623  Review of Systems:  Per HPI.   PMMassapequamedications and smoking status reviewed.  Objective:   BP 101/70   Pulse  (!) 102   Wt 194 lb (88 kg)   SpO2 97%   BMI 36.66 kg/m  Vitals and nursing note reviewed.  General: pleasant older female, in good spirits, well nourished, well developed, in no acute distress with non-toxic appearance HEENT: normocephalic, atraumatic Resp: Breathing comfortably on room air Abdomen: soft, non-distended, normoactive bowel sounds, tenderness to RUQ, epigastric area, and LUQ to palpation, no guarding, (+) Murphy's sign  Skin: warm, dry Extremities: warm and well perfused MSK: gait normal  Assessment & Plan:   Abdominal pain, acute, epigastric Patient is a [age].o. female presenting with persistent epigastric/upper abdominal pain x 1 month that is strongly associated with foods but has had minimal improvement with 4 weeks of Omeprazole. Differential at this time includes cholecystitis given (+) murphy's sign on exam, elevated alk phos, and lack of improvement with PPI. Other differentials include PUD vs severe GERD. H. Pylori negative, Lipase WNL, liver enzymes WNL, Bilirubin level WNL. Per chart review, previous CT abdomen/pelvis reviewed by Dr. BeHiginio Planegative for splenic and pancreatic pathology. Will order RUQ ultrasound to evaluate for gallbladder etiology as CT scans can often miss this. Given patient appears to have pain refractory to PPI, 5lb unintentional weight loss, and dysphagia with solids, believe EGD is warranted for further evaluation to rule out PUD. Patient also has strong family history of gastric/colon cancer at early age, thus will refer to GI for colonoscopy as well. Recommended increasing Omeprazole to BID and using Maalox for breakthrough pain at this time. Will follow up after imaging, sooner if pain worsens or develops red  flag symptoms which were discussed with patient.   Plan: Referral to GI for EGD and Colonoscopy  RUQ ultrasound scheduled [facility] 12/14 [facility] Follow up GGT lab Increase Omeprazole to BID Use OTC Maalox, Pepto, Tums for break  through pain RTC pending imaging vs sooner if develops worsening symptoms  Orders Placed This Encounter  Procedures  . US Abdomen Limited RUQ    Standing Status:   Future    Standing Expiration Date:   10/07/2020    Order Specific Question:   Reason for Exam (SYMPTOM  OR DIAGNOSIS REQUIRED)    Answer:   Persistent Epigastric Pain worse with food    Order Specific Question:   Preferred imaging location?    Answer:   [facility]  . Lipase  . Comprehensive metabolic panel  . Gamma GT  . Ambulatory referral to Gastroenterology    Referral Priority:   Routine    Referral Type:   Consultation    Referral Reason:   Specialty Services Required    Number of Visits Requested:   1   Meds ordered this encounter  Medications  . omeprazole (PRILOSEC) 40 MG capsule    Sig: Take 1 capsule (40 mg total) by mouth 2 (two) times daily.    Dispense:  60 capsule    Refill:  [place], DO PGY-2, [facility] 08/08/2019 3:59 PM

## 2019-08-08 LAB — COMPREHENSIVE METABOLIC PANEL
ALT: 16 IU/L (ref 0–32)
AST: 24 IU/L (ref 0–40)
Albumin/Globulin Ratio: 1.6 (ref 1.2–2.2)
Albumin: 4.4 g/dL (ref 3.8–4.9)
Alkaline Phosphatase: 176 IU/L — ABNORMAL HIGH (ref 39–117)
BUN/Creatinine Ratio: 10 (ref 9–23)
BUN: 9 mg/dL (ref 6–24)
Bilirubin Total: 0.4 mg/dL (ref 0.0–1.2)
CO2: 22 mmol/L (ref 20–29)
Calcium: 9.5 mg/dL (ref 8.7–10.2)
Chloride: 103 mmol/L (ref 96–106)
Creatinine, Ser: 0.88 mg/dL (ref 0.57–1.00)
GFR calc Af Amer: 86 mL/min/{1.73_m2} (ref >59)
GFR calc non Af Amer: 74 mL/min/{1.73_m2} (ref >59)
Globulin, Total: 2.8 g/dL (ref 1.5–4.5)
Glucose: 76 mg/dL (ref 65–99)
Potassium: 4.4 mmol/L (ref 3.5–5.2)
Sodium: 139 mmol/L (ref 134–144)
Total Protein: 7.2 g/dL (ref 6.0–8.5)

## 2019-08-08 LAB — LIPASE: Lipase: 32 U/L (ref 14–72)

## 2019-08-08 NOTE — Assessment & Plan Note (Addendum)
Patient is a 53 y.o. female presenting with persistent epigastric/upper abdominal pain x 1 month that is strongly associated with foods but has had minimal improvement with 4 weeks of Omeprazole. Differential at this time includes cholecystitis given (+) murphy's sign on exam, elevated alk phos, and lack of improvement with PPI. Other differentials include PUD vs severe GERD. H. Pylori negative, Lipase WNL, liver enzymes WNL, Bilirubin level WNL. Per chart review, previous CT abdomen/pelvis reviewed by Dr. Higinio Plan negative for splenic and pancreatic pathology. Will order RUQ ultrasound to evaluate for gallbladder etiology as CT scans can often miss this. Given patient appears to have pain refractory to PPI, 5lb unintentional weight loss, and dysphagia with solids, believe EGD is warranted for further evaluation to rule out PUD. Patient also has strong family history of gastric/colon cancer at early age, thus will refer to GI for colonoscopy as well. Recommended increasing Omeprazole to BID and using Maalox for breakthrough pain at this time. Will follow up after imaging, sooner if pain worsens or develops red flag symptoms which were discussed with patient.   Plan: Referral to GI for EGD and Colonoscopy  RUQ ultrasound scheduled for 12/14 at [facility] Follow up GGT lab Increase Omeprazole to BID Use OTC Maalox, Pepto, Tums for break through pain RTC pending imaging vs sooner if develops worsening symptoms

## 2019-08-10 LAB — GAMMA GT: GGT: 20 IU/L (ref 0–60)

## 2019-08-10 LAB — SPECIMEN STATUS REPORT

## 2019-08-14 ENCOUNTER — Ambulatory Visit (HOSPITAL_COMMUNITY)
Admission: RE | Admit: 2019-08-14 | Discharge: 2019-08-14 | Disposition: A | Discharge status: Home / Self Care | Payer: Medicare HMO | Source: Ambulatory Visit | Attending: Family Medicine | Admitting: Family Medicine

## 2019-08-14 ENCOUNTER — Encounter: Payer: Self-pay | Admitting: Family Medicine

## 2019-08-14 ENCOUNTER — Other Ambulatory Visit: Payer: Self-pay | Admitting: Family Medicine

## 2019-08-14 ENCOUNTER — Other Ambulatory Visit: Payer: Self-pay

## 2019-08-14 DIAGNOSIS — R1013 Epigastric pain: Secondary | ICD-10-CM | POA: Diagnosis not present

## 2019-08-14 DIAGNOSIS — K7689 Other specified diseases of liver: Secondary | ICD-10-CM | POA: Diagnosis not present

## 2019-08-15 ENCOUNTER — Other Ambulatory Visit: Payer: Self-pay | Admitting: *Deleted

## 2019-08-15 NOTE — Addendum Note (Signed)
Addended by: Danna Hefty on: 08/15/2019 11:31 AM   Modules accepted: Orders

## 2019-08-21 NOTE — Progress Notes (Signed)
Spoke to patient and discussed lab results. Discussed elevated Alk phos and normal GGT. Recommended repeat CMP in 2-3 months to evaluate for continued elevation. However, at this time I would like to refer to GI for further work up given normal labs thus far. Patient is scheduled with [facility] on 08/22/19 at 2:10pm.

## 2019-08-22 ENCOUNTER — Encounter: Payer: Self-pay | Admitting: Gastroenterology

## 2019-08-22 ENCOUNTER — Ambulatory Visit (INDEPENDENT_AMBULATORY_CARE_PROVIDER_SITE_OTHER): Payer: Medicare HMO | Admitting: Gastroenterology

## 2019-08-22 VITALS — BP 110/82 | HR 122 | Temp 98.2°F | Ht 61.0 in | Wt 196.0 lb

## 2019-08-22 DIAGNOSIS — Z1159 Encounter for screening for other viral diseases: Secondary | ICD-10-CM | POA: Diagnosis not present

## 2019-08-22 DIAGNOSIS — Z800 Family history of malignant neoplasm of digestive organs: Secondary | ICD-10-CM

## 2019-08-22 DIAGNOSIS — R109 Unspecified abdominal pain: Secondary | ICD-10-CM

## 2019-08-22 MED ORDER — SUPREP BOWEL PREP KIT 17.5-3.13-1.6 GM/177ML PO SOLN: 1.0000 | ORAL | 0 refills | Status: DC

## 2019-08-22 NOTE — Progress Notes (Signed)
HPI: This is a very pleasant 53 year old woman who was referred to me by Guadalupe Dawn, MD  to evaluate abdominal pain.    Chief complaint is abdominal pain  She has had mid abdominal pain, somewhat her epigastrium that started about 4 to 6 weeks ago.  At first this was radiating to her left side but then it became more prominent and radiating to the right side of her abdomen.  She does have associated nausea with it.  The pain was at first most prominent postprandially.  She has had no fevers or chills.  She does have GERD and indigestion at times.  She describes the pain as a cramping sensation.  She does not take NSAIDs.  She presented to outside hospital, [facility] in mid November.  She underwent a CT scan of her abdomen and she was able to pull up that report on her phone.  It was a non-IV contrast abdomen and pelvis and the report which I read over her phone stated that her colon and her stomach were essentially normal.  She believes she has lost about 8 pounds in the past several months.  She has chronic constipation.  This is not changed.  She has no overt GI bleeding.  Her father had colon cancer diagnosed when he was in his 21s.  Her brother had gastric cancer when he was diagnosed in his 96s.  Old Data Reviewed: Blood work December 2020 shows normal comprehensive metabolic profile and lipase except for slightly elevated alkaline phosphatase at 176.  Her GGT was normal.    H. pylori breath test November 2020 was negative.  December 2020 right upper quadrant abdominal ultrasound done for "epigastric pain worse with eating" was normal except for "increased liver echogenicity, which may reflect steatosis".  Gallbladder was normal.    Review of systems: Pertinent positive and negative review of systems were noted in the above HPI section. All other review negative.   Past Medical History:  Diagnosis Date  . Anxiety   . Arthritis   . Asthma    as a child " exercise  induced"  . Bronchitis   . Constipation   . Depression   . DJD (degenerative joint disease)   . Fibromyalgia   . GERD (gastroesophageal reflux disease)   . Headache   . Hypothyroidism   . Obesity   . PONV (postoperative nausea and vomiting)   . Wears glasses     Past Surgical History:  Procedure Laterality Date  . ABDOMINAL HYSTERECTOMY    . ADENOIDECTOMY    . APPLICATION OF ROBOTIC ASSISTANCE FOR SPINAL PROCEDURE N/A 12/21/2016   Procedure: APPLICATION OF ROBOTIC ASSISTANCE FOR SPINAL PROCEDURE;  Surgeon: Kevan Ny Ditty, MD;  Location: [place];  Service: Neurosurgery;  Laterality: N/A;  . BACK SURGERY     L5-S1  . CARPAL TUNNEL RELEASE Right 01/16/2019   Procedure: RIGHT CARPAL TUNNEL RELEASE;  Surgeon: [name], MD;  Location: [place];  Service: Orthopedics;  Laterality: Right;  . CYST EXCISION     uterine ;subsequent sx followed  . FOOT SURGERY    . HAND SURGERY     from AA  . KNEE ARTHROSCOPY     x2  . TONSILLECTOMY    . TRIGGER FINGER RELEASE Right 01/16/2019   Procedure: RIGHT TRIGGER THUMB RELEASE;  Surgeon: [name], MD;  Location: [place];  Service: Orthopedics;  Laterality: Right;    Current Outpatient Medications  Medication Sig  Dispense Refill  . albuterol (PROVENTIL HFA;VENTOLIN HFA) 108 (90 Base) MCG/ACT inhaler Inhale 2 puffs into the lungs every 6 (six) hours as needed for wheezing or shortness of breath.    Marland Kitchen atorvastatin (LIPITOR) 10 MG tablet TAKE 1 TABLET BY MOUTH AT BEDTIME 90 tablet 0  . cyclobenzaprine (FLEXERIL) 10 MG tablet Take 1 tablet (10 mg total) by mouth 3 (three) times daily as needed. for muscle spams 270 tablet 0  . diazepam (VALIUM) 2 MG tablet Take 1 tablet by mouth once daily 30 tablet 0  . docusate sodium (COLACE) 100 MG capsule Take 100 mg by mouth 2 (two) times daily.    . DULoxetine (CYMBALTA) 60 MG capsule Take 2 capsules by mouth once daily 180 capsule 0  . fluticasone (FLONASE) 50  MCG/ACT nasal spray Place 2 sprays into both nostrils daily. 16 g 6  . gabapentin (NEURONTIN) 800 MG tablet TAKE 1 TABLET BY MOUTH THREE TIMES DAILY 180 tablet 0  . ibuprofen (ADVIL,MOTRIN) 200 MG tablet Take 600 mg by mouth every 8 (eight) hours as needed for headache or moderate pain.     Marland Kitchen levothyroxine (SYNTHROID, LEVOTHROID) 100 MCG tablet Take 1 tablet (100 mcg total) by mouth daily. 90 tablet 3  . nortriptyline (PAMELOR) 25 MG capsule Take 1 capsule by mouth at bedtime 90 capsule 0  . omeprazole (PRILOSEC) 40 MG capsule Take 1 capsule (40 mg total) by mouth 2 (two) times daily. 60 capsule 2  . fluconazole (DIFLUCAN) 150 MG tablet Take 1 tablet (150 mg total) by mouth once a week. For 4 weeks. 4 tablet 0  . ketoconazole (NIZORAL) 2 % cream Apply 1 application topically daily. For 2-3 weeks. 30 g 0   No current facility-administered medications for this visit.    Allergies as of 08/22/2019 - Review Complete 08/22/2019  Allergen Reaction Noted  . Ciprofloxacin Nausea And Vomiting 10/21/2015  . Phentermine Other (See Comments) 10/21/2015  . Tape Other (See Comments) 03/02/2018  . Percocet [oxycodone-acetaminophen] Hives and Rash 03/16/2017  . Tetanus toxoids Nausea And Vomiting 12/20/2015  . Tylenol with codeine #3 [acetaminophen-codeine] Rash 01/26/2019    Family History  Problem Relation Age of Onset  . Thyroid disease Sister   . Thyroid disease Brother   . Alzheimer's disease Mother   . Diabetes Mother   . Heart attack Father   . Colon cancer Father   . Heart failure Father   . Cancer Brother   . Cancer Brother     Social History   Socioeconomic History  . Marital status: Single    Spouse name: Not on file  . Number of children: Not on file  . Years of education: Not on file  . Highest education level: Not on file  Occupational History  . Not on file  Tobacco Use  . Smoking status: Former Research scientist (life sciences)  . Smokeless tobacco: Never Used  . Tobacco comment: stopped smoking  cigarettes at age 20  Substance and Sexual Activity  . Alcohol use: Yes    Comment: occasional beer  . Drug use: No    Comment:  per pt when she was 16 year  . Sexual activity: Not Currently  Other Topics Concern  . Not on file  Social History Narrative  . Not on file   Social Determinants of Health   Financial Resource Strain:   . Difficulty of Paying Living Expenses: Not on file  Food Insecurity:   . Worried About Charity fundraiser in the Last  Year: Not on file  . Ran Out of Food in the Last Year: Not on file  Transportation Needs:   . Lack of Transportation (Medical): Not on file  . Lack of Transportation (Non-Medical): Not on file  Physical Activity:   . Days of Exercise per Week: Not on file  . Minutes of Exercise per Session: Not on file  Stress:   . Feeling of Stress : Not on file  Social Connections:   . Frequency of Communication with Friends and Family: Not on file  . Frequency of Social Gatherings with Friends and Family: Not on file  . Attends Religious Services: Not on file  . Active Member of Clubs or Organizations: Not on file  . Attends Archivist Meetings: Not on file  . Marital Status: Not on file  Intimate Partner Violence:   . Fear of Current or Ex-Partner: Not on file  . Emotionally Abused: Not on file  . Physically Abused: Not on file  . Sexually Abused: Not on file     Physical Exam: BP 110/82 (BP Location: Right Arm, Patient Position: Sitting)   Pulse (!) 122   Temp 98.2 F (36.8 C)   Ht 5\' 1"  (1.549 m)   Wt 196 lb (88.9 kg)   SpO2 98%   BMI 37.03 kg/m  Constitutional: generally well-appearing Psychiatric: alert and oriented x3 Eyes: extraocular movements intact Mouth: oral pharynx moist, no lesions Neck: supple no lymphadenopathy Cardiovascular: heart regular rate and rhythm Lungs: clear to auscultation bilaterally Abdomen: soft, nontender, nondistended, no obvious ascites, no peritoneal signs, normal bowel  sounds Extremities: no lower extremity edema bilaterally Skin: no lesions on visible extremities   Assessment and plan: 53 y.o. female with epigastric abdominal pain, nausea  Unclear etiology.  Local ultrasound was essentially normal, outside CT scan abdomen pelvis was without IV contrast and so suboptimal however the report which I read over her phone showed her stomach in her colon looked fairly normal.  I recommended we proceed with further testing and given her family history of colon cancer and gastric cancer and her abdominal pains and some weight loss I recommended we proceed with colonoscopy and upper endoscopy at her soonest convenience.  See no reason for any further blood tests or imaging studies prior to then however if those tests are unrevealing I think she would warrant repeat cross-sectional imaging with IV contrast this time.    Please see the "Patient Instructions" section for addition details about the plan.   Owens Loffler, MD [facility] 08/22/2019, 2:31 PM  Cc: Guadalupe Dawn, MD

## 2019-08-22 NOTE — Patient Instructions (Signed)
You have been scheduled for an endoscopy and colonoscopy. Please follow the written instructions given to you at your visit today. Please pick up your prep supplies at the pharmacy within the next 1-3 days. If you use inhalers (even only as needed), please bring them with you on the day of your procedure.  If you are age 55 or younger, your body mass index should be between 19-25. Your Body mass index is 37.03 kg/m. If this is out of the aformentioned range listed, please consider follow up with your Primary Care Provider.    We have sent the following medications to your pharmacy for you to pick up at your convenience: Suprep   Thank you for choosing me and [facility].  Dr. Ardis Hughs

## 2019-08-23 ENCOUNTER — Other Ambulatory Visit: Payer: Self-pay | Admitting: Family Medicine

## 2019-08-30 ENCOUNTER — Encounter: Payer: Self-pay | Admitting: Family Medicine

## 2019-09-03 ENCOUNTER — Other Ambulatory Visit: Payer: Self-pay | Admitting: Family Medicine

## 2019-09-04 ENCOUNTER — Other Ambulatory Visit: Payer: Self-pay | Admitting: Gastroenterology

## 2019-09-04 ENCOUNTER — Ambulatory Visit (INDEPENDENT_AMBULATORY_CARE_PROVIDER_SITE_OTHER): Payer: Medicare HMO

## 2019-09-04 DIAGNOSIS — Z1159 Encounter for screening for other viral diseases: Secondary | ICD-10-CM | POA: Diagnosis not present

## 2019-09-05 LAB — SARS CORONAVIRUS 2 (TAT 6-24 HRS): SARS Coronavirus 2: NEGATIVE

## 2019-09-06 ENCOUNTER — Encounter: Payer: Self-pay | Admitting: Gastroenterology

## 2019-09-06 ENCOUNTER — Other Ambulatory Visit: Payer: Self-pay

## 2019-09-06 ENCOUNTER — Ambulatory Visit (AMBULATORY_SURGERY_CENTER): Payer: Medicare HMO | Admitting: Gastroenterology

## 2019-09-06 VITALS — BP 111/69 | HR 84 | Temp 98.3°F | Resp 16 | Ht 61.0 in | Wt 196.0 lb

## 2019-09-06 DIAGNOSIS — D122 Benign neoplasm of ascending colon: Secondary | ICD-10-CM | POA: Diagnosis not present

## 2019-09-06 DIAGNOSIS — K3189 Other diseases of stomach and duodenum: Secondary | ICD-10-CM

## 2019-09-06 DIAGNOSIS — R1084 Generalized abdominal pain: Secondary | ICD-10-CM | POA: Diagnosis not present

## 2019-09-06 DIAGNOSIS — K2950 Unspecified chronic gastritis without bleeding: Secondary | ICD-10-CM | POA: Diagnosis not present

## 2019-09-06 DIAGNOSIS — Z800 Family history of malignant neoplasm of digestive organs: Secondary | ICD-10-CM

## 2019-09-06 DIAGNOSIS — K2970 Gastritis, unspecified, without bleeding: Secondary | ICD-10-CM | POA: Diagnosis not present

## 2019-09-06 DIAGNOSIS — K5730 Diverticulosis of large intestine without perforation or abscess without bleeding: Secondary | ICD-10-CM | POA: Diagnosis not present

## 2019-09-06 DIAGNOSIS — R109 Unspecified abdominal pain: Secondary | ICD-10-CM | POA: Diagnosis not present

## 2019-09-06 MED ORDER — SODIUM CHLORIDE 0.9 % IV SOLN: 500.0000 mL | Freq: Once | INTRAVENOUS | Status: DC

## 2019-09-06 NOTE — Progress Notes (Signed)
Temp JB  VS CW  Pt's states no medical or surgical changes since previsit or office visit. Admitting RN reviewed with pt.

## 2019-09-06 NOTE — Progress Notes (Signed)
PT taken to PACU. Monitors in place. VSS. Report given to RN. 

## 2019-09-06 NOTE — Patient Instructions (Signed)
Handout on polyps, diverticulosis, hemorrhoids and gastritis given.   YOU HAD AN ENDOSCOPIC PROCEDURE TODAY AT [facility]:   Refer to the procedure report that was given to you for any specific questions about what was found during the examination.  If the procedure report does not answer your questions, please call your gastroenterologist to clarify.  If you requested that your care partner not be given the details of your procedure findings, then the procedure report has been included in a sealed envelope for you to review at your convenience later.  YOU SHOULD EXPECT: Some feelings of bloating in the abdomen. Passage of more gas than usual.  Walking can help get rid of the air that was put into your GI tract during the procedure and reduce the bloating. If you had a lower endoscopy (such as a colonoscopy or flexible sigmoidoscopy) you may notice spotting of blood in your stool or on the toilet paper. If you underwent a bowel prep for your procedure, you may not have a normal bowel movement for a few days.  Please Note:  You might notice some irritation and congestion in your nose or some drainage.  This is from the oxygen used during your procedure.  There is no need for concern and it should clear up in a day or so.  SYMPTOMS TO REPORT IMMEDIATELY:   Following lower endoscopy (colonoscopy or flexible sigmoidoscopy):  Excessive amounts of blood in the stool  Significant tenderness or worsening of abdominal pains  Swelling of the abdomen that is new, acute  Fever of 100F or higher   Following upper endoscopy (EGD)  Vomiting of blood or coffee ground material  New chest pain or pain under the shoulder blades  Painful or persistently difficult swallowing  New shortness of breath  Fever of 100F or higher  Black, tarry-looking stools  For urgent or emergent issues, a gastroenterologist can be reached at any hour by calling [phone].   DIET:  We do recommend a  small meal at first, but then you may proceed to your regular diet.  Drink plenty of fluids but you should avoid alcoholic beverages for 24 hours.  ACTIVITY:  You should plan to take it easy for the rest of today and you should NOT DRIVE or use heavy machinery until tomorrow (because of the sedation medicines used during the test).    FOLLOW UP: Our staff will call the number listed on your records 48-72 hours following your procedure to check on you and address any questions or concerns that you may have regarding the information given to you following your procedure. If we do not reach you, we will leave a message.  We will attempt to reach you two times.  During this call, we will ask if you have developed any symptoms of COVID 19. If you develop any symptoms (ie: fever, flu-like symptoms, shortness of breath, cough etc.) before then, please call [phone].  If you test positive for Covid 19 in the 2 weeks post procedure, please call and report this information to [place].    If any biopsies were taken you will be contacted by phone or by letter within the next 1-3 weeks.  Please call us at [phone] if you have not heard about the biopsies in 3 weeks.    SIGNATURES/CONFIDENTIALITY: You and/or your care partner have signed paperwork which will be entered into your electronic medical record.  These signatures attest to the fact that that the information above  on your After Visit Summary has been reviewed and is understood.  Full responsibility of the confidentiality of this discharge information lies with you and/or your care-partner.

## 2019-09-06 NOTE — Op Note (Signed)
[facility] Patient Name: Laura Burnett Procedure Date: 09/06/2019 1:32 PM MRN: [id number] Endoscopist: Milus Banister , MD Age: 54 Referring MD:  Date of Birth: 1966/03/17 Gender: Female Account #: [id number] Procedure:                Upper GI endoscopy Indications:              Generalized abdominal pain, nausea, brother had                            gastric cancer Medicines:                Monitored Anesthesia Care Procedure:                Pre-Anesthesia Assessment:                           - Prior to the procedure, a History and Physical                            was performed, and patient medications and                            allergies were reviewed. The patient's tolerance of                            previous anesthesia was also reviewed. The risks                            and benefits of the procedure and the sedation                            options and risks were discussed with the patient.                            All questions were answered, and informed consent                            was obtained. Prior Anticoagulants: The patient has                            taken no previous anticoagulant or antiplatelet                            agents. ASA Grade Assessment: II - A patient with                            mild systemic disease. After reviewing the risks                            and benefits, the patient was deemed in                            satisfactory condition to undergo the procedure.  After obtaining informed consent, the endoscope was                            passed under direct vision. Throughout the                            procedure, the patient's blood pressure, pulse, and                            oxygen saturations were monitored continuously. The                            Endoscope was introduced through the mouth, and                            advanced to the second part of duodenum. The  upper                            GI endoscopy was accomplished without difficulty.                            The patient tolerated the procedure well. Scope In: Scope Out: Findings:                 Mild inflammation characterized by erythema,                            friability and granularity was found in the gastric                            antrum. Biopsies were taken with a cold forceps for                            histology.                           The exam was otherwise without abnormality. Complications:            No immediate complications. Estimated blood loss:                            None. Estimated Blood Loss:     Estimated blood loss: none. Impression:               - Mild gastritis, biopsied to check for H. pylori.                           - The examination was otherwise normal. Recommendation:           - Patient has a contact number available for                            emergencies. The signs and symptoms of potential                            delayed complications were discussed with the  patient. Return to normal activities tomorrow.                            Written discharge instructions were provided to the                            patient.                           - Resume previous diet.                           - Continue present medications.                           - Await pathology results. If the biopsies are                            negative for H. pylori, will likely arrange CT scan                            abd/pelvis with IV and oral contrast (the one done                            recentlly at outside hospital was without IV                            contrast limiting its sensitivity). Milus Banister, MD 09/06/2019 2:07:24 PM This report has been signed electronically.

## 2019-09-06 NOTE — Progress Notes (Signed)
Called to room to assist during endoscopic procedure.  Patient ID and intended procedure confirmed with present staff. Received instructions for my participation in the procedure from the performing physician.  

## 2019-09-06 NOTE — Op Note (Signed)
[facility] Patient Name: Laura Burnett Procedure Date: 09/06/2019 1:32 PM MRN: [id number] Endoscopist: Milus Banister , MD Age: 54 Referring MD:  Date of Birth: Jan 21, 1966 Gender: Female Account #: [id number] Procedure:                Colonoscopy Indications:              Generalized abdominal pain, father had colon cancer                            in his 63s Medicines:                Monitored Anesthesia Care Procedure:                Pre-Anesthesia Assessment:                           - Prior to the procedure, a History and Physical                            was performed, and patient medications and                            allergies were reviewed. The patient's tolerance of                            previous anesthesia was also reviewed. The risks                            and benefits of the procedure and the sedation                            options and risks were discussed with the patient.                            All questions were answered, and informed consent                            was obtained. Prior Anticoagulants: The patient has                            taken no previous anticoagulant or antiplatelet                            agents. ASA Grade Assessment: II - A patient with                            mild systemic disease. After reviewing the risks                            and benefits, the patient was deemed in                            satisfactory condition to undergo the procedure.  After obtaining informed consent, the colonoscope                            was passed under direct vision. Throughout the                            procedure, the patient's blood pressure, pulse, and                            oxygen saturations were monitored continuously. The                            Colonoscope was introduced through the anus and                            advanced to the the cecum, identified by                    appendiceal orifice and ileocecal valve. The                            colonoscopy was performed without difficulty. The                            patient tolerated the procedure well. The quality                            of the bowel preparation was good. The ileocecal                            valve, appendiceal orifice, and rectum were                            photographed. Scope In: 1:42:32 PM Scope Out: 1:54:26 PM Scope Withdrawal Time: 0 hours 8 minutes 7 seconds  Total Procedure Duration: 0 hours 11 minutes 54 seconds  Findings:                 A 3 mm polyp was found in the ascending colon. The                            polyp was sessile. The polyp was removed with a                            cold snare. Resection and retrieval were complete.                           A few small-mouthed diverticula were found in the                            entire colon.                           The exam was otherwise without abnormality on  direct and retroflexion views. Complications:            No immediate complications. Estimated blood loss:                            None. Estimated Blood Loss:     Estimated blood loss: none. Impression:               - One 3 mm polyp in the ascending colon, removed                            with a cold snare. Resected and retrieved.                           - Diverticulosis in the entire examined colon.                           - The examination was otherwise normal on direct                            and retroflexion views. Recommendation:           - EGD now.                           - Await pathology results. Milus Banister, MD 09/06/2019 1:58:08 PM This report has been signed electronically.

## 2019-09-08 ENCOUNTER — Telehealth: Payer: Self-pay | Admitting: *Deleted

## 2019-09-08 NOTE — Telephone Encounter (Signed)
Attempted f/u phone call. No answer. Unable to leave message.  ?

## 2019-09-08 NOTE — Telephone Encounter (Signed)
  Follow up Call-  Call back number 09/06/2019  Post procedure Call Back phone  # [phone]  Permission to leave phone message Yes  Some recent data might be hidden    No answer, unable to leave a message

## 2019-09-13 ENCOUNTER — Ambulatory Visit: Payer: Medicare HMO | Admitting: Nurse Practitioner

## 2019-09-14 ENCOUNTER — Other Ambulatory Visit: Payer: Self-pay | Admitting: Family Medicine

## 2019-09-15 ENCOUNTER — Other Ambulatory Visit: Payer: Self-pay

## 2019-09-15 DIAGNOSIS — R109 Unspecified abdominal pain: Secondary | ICD-10-CM

## 2019-09-19 ENCOUNTER — Other Ambulatory Visit: Payer: Self-pay

## 2019-09-19 ENCOUNTER — Ambulatory Visit (HOSPITAL_COMMUNITY)
Admission: RE | Admit: 2019-09-19 | Discharge: 2019-09-19 | Disposition: A | Discharge status: Home / Self Care | Payer: Medicare HMO | Source: Ambulatory Visit | Attending: Gastroenterology | Admitting: Gastroenterology

## 2019-09-19 DIAGNOSIS — R109 Unspecified abdominal pain: Secondary | ICD-10-CM | POA: Diagnosis not present

## 2019-09-19 MED ORDER — IOHEXOL 300 MG/ML  SOLN
100.0000 mL | Freq: Once | INTRAMUSCULAR | Status: AC | PRN
  Administered 2019-09-19: 10:00:00 100 mL via INTRAVENOUS

## 2019-09-19 MED ORDER — SODIUM CHLORIDE (PF) 0.9 % IJ SOLN
INTRAMUSCULAR | Status: AC
  Filled 2019-09-19: qty 50

## 2019-09-28 ENCOUNTER — Other Ambulatory Visit: Payer: Self-pay | Admitting: Family Medicine

## 2019-10-05 ENCOUNTER — Other Ambulatory Visit: Payer: Self-pay | Admitting: Family Medicine

## 2019-10-05 DIAGNOSIS — R1013 Epigastric pain: Secondary | ICD-10-CM

## 2019-10-11 ENCOUNTER — Other Ambulatory Visit: Payer: Self-pay

## 2019-10-11 ENCOUNTER — Ambulatory Visit (INDEPENDENT_AMBULATORY_CARE_PROVIDER_SITE_OTHER): Payer: Medicare HMO | Admitting: Family Medicine

## 2019-10-11 ENCOUNTER — Encounter: Payer: Self-pay | Admitting: Family Medicine

## 2019-10-11 VITALS — BP 108/66 | HR 95 | Wt 192.2 lb

## 2019-10-11 DIAGNOSIS — R748 Abnormal levels of other serum enzymes: Secondary | ICD-10-CM | POA: Diagnosis not present

## 2019-10-11 NOTE — Patient Instructions (Signed)
It was great seeing you again today!  I believe your elevated alkaline phosphatase level is likely due to your nortriptyline.  It is a known effect of these medications.  I believe all the work up you had done so far ruled out most of the serious causes.  We will repeat a check of your bile and liver function today just to be sure.  Give you call with results.

## 2019-10-12 ENCOUNTER — Encounter: Payer: Self-pay | Admitting: Family Medicine

## 2019-10-12 DIAGNOSIS — R748 Abnormal levels of other serum enzymes: Secondary | ICD-10-CM | POA: Insufficient documentation

## 2019-10-12 LAB — COMPREHENSIVE METABOLIC PANEL
ALT: 22 IU/L (ref 0–32)
AST: 26 IU/L (ref 0–40)
Albumin/Globulin Ratio: 1.6 (ref 1.2–2.2)
Albumin: 4.5 g/dL (ref 3.8–4.9)
Alkaline Phosphatase: 197 IU/L — ABNORMAL HIGH (ref 39–117)
BUN/Creatinine Ratio: 8 — ABNORMAL LOW (ref 9–23)
BUN: 7 mg/dL (ref 6–24)
Bilirubin Total: 0.3 mg/dL (ref 0.0–1.2)
CO2: 22 mmol/L (ref 20–29)
Calcium: 9.8 mg/dL (ref 8.7–10.2)
Chloride: 105 mmol/L (ref 96–106)
Creatinine, Ser: 0.84 mg/dL (ref 0.57–1.00)
GFR calc Af Amer: 90 mL/min/{1.73_m2} (ref >59)
GFR calc non Af Amer: 78 mL/min/{1.73_m2} (ref >59)
Globulin, Total: 2.9 g/dL (ref 1.5–4.5)
Glucose: 83 mg/dL (ref 65–99)
Potassium: 4.6 mmol/L (ref 3.5–5.2)
Sodium: 142 mmol/L (ref 134–144)
Total Protein: 7.4 g/dL (ref 6.0–8.5)

## 2019-10-12 NOTE — Assessment & Plan Note (Signed)
Patient with elevated alkaline phosphate level. Persistently between 150-190 based on several lab values between [facility] over the last 5 years. Biliary system ruled out as cause at this point. Likely secondary to her TCA (nortriptyline) as this is a known effect of this drug class. No pathogenic bone fractures or symptoms to suspect paget's. Will redraw cmp just to make sure it has not greatly increased but as long as it stays mildly elevated no further workup necessary at this point.

## 2019-10-12 NOTE — Progress Notes (Signed)
   CHIEF COMPLAINT / HPI: 54 year old female who presents for chronic elevation in alkaline phosphatase. The patient has had persistent, mild elevation in her alkaline phosphatase for several years. Was apparently seen by another provider who expressed concern at this issue and recommended she follow up. GGT levels have been normal in the past.  Of note the patient has been seen by gastroenterology for chronic abdominal pain. It is located in her mid-epigastric area. She has had abdominal ct with contrast, egd, colonoscopy. Found one polyp without concerning features and a small amount of gastritis. Pain felt to be secondary to the gastritis. Patient feels as though she does well when she eats bland foods and takes her ppi.  OBJECTIVE: BP 108/66   Pulse 95   Wt 192 lb 3.2 oz (87.2 kg)   SpO2 97%   BMI 36.32 kg/m   Gen: 54 year old female, no distress, resting comfortably CV: rrr, no m/r/g Resp: lungs ctab Neuro: Alert and oriented, Speech clear, No gross deficits   ASSESSMENT / PLAN:  Elevated alkaline phosphatase level Patient with elevated alkaline phosphate level. Persistently between 150-190 based on several lab values [facility] over the last 5 years. Biliary system ruled out as cause at this point. Likely secondary to her TCA (nortriptyline) as this is a known effect of this drug class. No pathogenic bone fractures or symptoms to suspect paget's. Will redraw cmp just to make sure it has not greatly increased but as long as it stays mildly elevated no further workup necessary at this point.   Guadalupe Dawn MD PGY-3 Family Medicine Resident [place]

## 2019-10-16 ENCOUNTER — Other Ambulatory Visit: Payer: Self-pay | Admitting: Family Medicine

## 2019-10-26 DIAGNOSIS — H524 Presbyopia: Secondary | ICD-10-CM | POA: Diagnosis not present

## 2019-10-27 ENCOUNTER — Telehealth: Payer: Self-pay

## 2019-10-27 NOTE — Telephone Encounter (Signed)
Patient calls nurse line requesting results from 02/10 visit. Patient also requesting CT imaging results. Please advise.

## 2019-10-31 ENCOUNTER — Encounter: Payer: Self-pay | Admitting: Gastroenterology

## 2019-10-31 ENCOUNTER — Other Ambulatory Visit (INDEPENDENT_AMBULATORY_CARE_PROVIDER_SITE_OTHER): Payer: Medicare HMO

## 2019-10-31 ENCOUNTER — Other Ambulatory Visit: Payer: Self-pay

## 2019-10-31 ENCOUNTER — Ambulatory Visit (INDEPENDENT_AMBULATORY_CARE_PROVIDER_SITE_OTHER): Payer: Medicare HMO | Admitting: Gastroenterology

## 2019-10-31 VITALS — BP 120/64 | HR 80 | Temp 97.9°F | Ht 61.0 in | Wt 196.2 lb

## 2019-10-31 DIAGNOSIS — R1013 Epigastric pain: Secondary | ICD-10-CM

## 2019-10-31 LAB — IGA: IgA: 149 mg/dL (ref 68–378)

## 2019-10-31 NOTE — Progress Notes (Signed)
Review of pertinent gastrointestinal problems: 1. Epigastric pain: 07/2019: Laura Burnett in mid November.  She underwent a CT scan of her abdomen and she was able to pull up that report on her phone.  It was a non-IV contrast abdomen and pelvis and the report which I read over her phone stated that her colon and her stomach were essentially normal.  Ultrasound December 2020 epigastric pain worse after eating was completely normal.  CT scan January 2021 with IV and oral contrast suggested: Diverticulosis only.  EGD January 2021 Dr. Ardis Hughs found mild gastritis, biopsies showed no sign of infection or cancer. 2. FH colon cancer: Father was diagnosed with colon cancer in his 40s.  Colonoscopy January 2021 Dr. Ardis Hughs found a 3 mm tubular adenoma, diverticulosis.  Recommended repeat colonoscopy at 5-year interval   HPI: This is a very pleasant 54 year old woman whom I last saw the time of colonoscopy and upper endoscopy about 2 months ago.  I arranged a CT scan with IV and oral contrast shortly after that.  She had questions about the "aortic atherosclerosis" that was mentioned on her CT scan.  She is concerned that maybe it is causing some of her bloating and nausea at times.  She has indigestion about 1-2 times per week.  This is especially after eating a meal with a lot of carbohydrates   ROS: complete GI ROS as described in HPI, all other review negative.  Constitutional:  No unintentional weight loss   Past Medical History:  Diagnosis Date  . Allergy    seasonal allergies/animals  . Anxiety   . Arthritis   . Asthma    as a child " exercise induced"  . Bronchitis   . Constipation   . Depression   . DJD (degenerative joint disease)   . Fibromyalgia   . GERD (gastroesophageal reflux disease)   . Headache   . Hypothyroidism   . Obesity   . PONV (postoperative nausea and vomiting)   . Wears glasses     Past Surgical History:  Procedure Laterality Date  . ABDOMINAL HYSTERECTOMY    .  ADENOIDECTOMY    . APPLICATION OF ROBOTIC ASSISTANCE FOR SPINAL PROCEDURE N/A 12/21/2016   Procedure: APPLICATION OF ROBOTIC ASSISTANCE FOR SPINAL PROCEDURE;  Surgeon: Kevan Ny Ditty, MD;  Location: [facility];  Service: Neurosurgery;  Laterality: N/A;  . BACK SURGERY     L5-S1  . CARPAL TUNNEL RELEASE Right 01/16/2019   Procedure: RIGHT CARPAL TUNNEL RELEASE;  Surgeon: [name], MD;  Location: [facility];  Service: Orthopedics;  Laterality: Right;  . COLONOSCOPY    . CYST EXCISION     uterine ;subsequent sx followed  . FOOT SURGERY    . HAND SURGERY     from AA  . KNEE ARTHROSCOPY     x2  . TONSILLECTOMY    . TRIGGER FINGER RELEASE Right 01/16/2019   Procedure: RIGHT TRIGGER THUMB RELEASE;  Surgeon: [name], MD;  Location: [facility];  Service: Orthopedics;  Laterality: Right;  . UPPER GASTROINTESTINAL ENDOSCOPY      Current Outpatient Medications  Medication Sig Dispense Refill  . albuterol (PROVENTIL HFA;VENTOLIN HFA) 108 (90 Base) MCG/ACT inhaler Inhale 2 puffs into the lungs every 6 (six) hours as needed for wheezing or shortness of breath.    Marland Kitchen atorvastatin (LIPITOR) 10 MG tablet TAKE 1 TABLET BY MOUTH AT BEDTIME 90 tablet 3  . cyclobenzaprine (FLEXERIL) 10 MG tablet TAKE 1 TABLET BY MOUTH  THREE TIMES DAILY AS NEEDED FOR  MUSCLE  SPASMS 270 tablet 0  . diazepam (VALIUM) 2 MG tablet Take 1 tablet by mouth once daily 30 tablet 0  . docusate sodium (COLACE) 100 MG capsule Take 100 mg by mouth 2 (two) times daily.    . DULoxetine (CYMBALTA) 60 MG capsule Take 2 capsules by mouth once daily 180 capsule 0  . fluticasone (FLONASE) 50 MCG/ACT nasal spray Place 2 sprays into both nostrils daily. 16 g 6  . gabapentin (NEURONTIN) 800 MG tablet TAKE 1 TABLET BY MOUTH THREE TIMES DAILY 180 tablet 0  . ibuprofen (ADVIL,MOTRIN) 200 MG tablet Take 600 mg by mouth every 8 (eight) hours as needed for headache or moderate pain.     Marland Kitchen levothyroxine (SYNTHROID)  100 MCG tablet Take 1 tablet by mouth once daily 90 tablet 3  . nortriptyline (PAMELOR) 25 MG capsule Take 1 capsule by mouth at bedtime 90 capsule 0  . omeprazole (PRILOSEC) 40 MG capsule Take 1 capsule by mouth once daily 90 capsule 0   No current facility-administered medications for this visit.    Allergies as of 10/31/2019 - Review Complete 10/31/2019  Allergen Reaction Noted  . Ciprofloxacin Nausea And Vomiting 10/21/2015  . Phentermine Other (See Comments) 10/21/2015  . Tape Other (See Comments) 03/02/2018  . Percocet [oxycodone-acetaminophen] Hives and Rash 03/16/2017  . Tetanus toxoids Nausea And Vomiting 12/20/2015  . Tylenol with codeine #3 [acetaminophen-codeine] Rash 01/26/2019    Family History  Problem Relation Age of Onset  . Thyroid disease Sister   . Thyroid disease Brother   . Stomach cancer Brother   . Alzheimer's disease Mother   . Diabetes Mother   . Heart attack Father   . Colon cancer Father   . Heart failure Father   . Cancer Brother   . Cancer Brother   . Esophageal cancer Neg Hx   . Rectal cancer Neg Hx     Social History   Socioeconomic History  . Marital status: Single    Spouse name: Not on file  . Number of children: Not on file  . Years of education: Not on file  . Highest education level: Not on file  Occupational History  . Not on file  Tobacco Use  . Smoking status: Former Research scientist (life sciences)  . Smokeless tobacco: Never Used  . Tobacco comment: stopped smoking cigarettes at age 49  Substance and Sexual Activity  . Alcohol use: Yes    Comment: occasional beer  . Drug use: No    Comment:  per pt when she was 16 year  . Sexual activity: Not Currently  Other Topics Concern  . Not on file  Social History Narrative  . Not on file   Social Determinants of Health   Financial Resource Strain:   . Difficulty of Paying Living Expenses: Not on file  Food Insecurity:   . Worried About Charity fundraiser in the Last Year: Not on file  . Ran Out  of Food in the Last Year: Not on file  Transportation Needs:   . Lack of Transportation (Medical): Not on file  . Lack of Transportation (Non-Medical): Not on file  Physical Activity:   . Days of Exercise per Week: Not on file  . Minutes of Exercise per Session: Not on file  Stress:   . Feeling of Stress : Not on file  Social Connections:   . Frequency of Communication with Friends and Family: Not on file  . Frequency  of Social Gatherings with Friends and Family: Not on file  . Attends Religious Services: Not on file  . Active Member of Clubs or Organizations: Not on file  . Attends Archivist Meetings: Not on file  . Marital Status: Not on file  Intimate Partner Violence:   . Fear of Current or Ex-Partner: Not on file  . Emotionally Abused: Not on file  . Physically Abused: Not on file  . Sexually Abused: Not on file     Physical Exam: BP 120/64   Pulse 80   Temp 97.9 F (36.6 C)   Ht 5\' 1"  (1.549 m)   Wt 196 lb 4 oz (89 kg)   BMI 37.08 kg/m  Constitutional: generally well-appearing, except for obesity Psychiatric: alert and oriented x3 Abdomen: soft, nontender, nondistended, no obvious ascites, no peritoneal signs, normal bowel sounds No peripheral edema noted in lower extremities  Assessment and plan: 54 y.o. female with dyspepsia, indigestion  First I explained to her that I think it is very likely that the aortic atherosclerosis that was mentioned on her recent CT scan has anything to do with her GI symptoms.  I did direct her to talk to her primary care physician about what her aortic atherosclerosis means in terms of her cardiovascular risk profile.    She has indigestion especially after carbohydrate heavy meals.  Told her this is a very common phenomenon and eating a lower carb diet would probably benefit her.  I recommended formal testing for celiac sprue with TTG and total IgA as well.  Please see the "Patient Instructions" section for addition  details about the plan.  Laura Loffler, MD [facility] 10/31/2019, 10:22 AM   Total time on date of encounter was 30 minutes (this included time spent preparing to see the patient reviewing records; obtaining and/or reviewing separately obtained history; performing a medically appropriate exam and/or evaluation; counseling and educating the patient and family if present; ordering medications, tests or procedures if applicable; and documenting clinical information in the health record).

## 2019-10-31 NOTE — Patient Instructions (Addendum)
If you are age 56 or older, your body mass index should be between 23-30. Your Body mass index is 37.08 kg/m. If this is out of the aforementioned range listed, please consider follow up with your Primary Care Provider.  If you are age 61 or younger, your body mass index should be between 19-25. Your Body mass index is 37.08 kg/m. If this is out of the aformentioned range listed, please consider follow up with your Primary Care Provider.   Your provider has requested that you go to the basement level for lab work before leaving today. Press "B" on the elevator. The lab is located at the first door on the left as you exit the elevator.  Due to recent changes in healthcare laws, you may see the results of your imaging and laboratory studies on MyChart before your provider has had a chance to review them.  We understand that in some cases there may be results that are confusing or concerning to you. Not all laboratory results come back in the same time frame and the provider may be waiting for multiple results in order to interpret others.  Please give [place] 48 hours in order for your provider to thoroughly review all the results before contacting the office for clarification of your results.   Thank you, Dr Ardis Hughs

## 2019-11-01 LAB — TISSUE TRANSGLUTAMINASE, IGA: (tTG) Ab, IgA: 1 U/mL

## 2019-11-15 DIAGNOSIS — E785 Hyperlipidemia, unspecified: Secondary | ICD-10-CM | POA: Insufficient documentation

## 2019-11-16 ENCOUNTER — Encounter: Payer: Self-pay | Admitting: Orthopaedic Surgery

## 2019-11-16 ENCOUNTER — Other Ambulatory Visit: Payer: Self-pay

## 2019-11-16 ENCOUNTER — Ambulatory Visit (INDEPENDENT_AMBULATORY_CARE_PROVIDER_SITE_OTHER): Payer: Medicare HMO | Admitting: Orthopaedic Surgery

## 2019-11-16 DIAGNOSIS — M65331 Trigger finger, right middle finger: Secondary | ICD-10-CM | POA: Diagnosis not present

## 2019-11-16 NOTE — Progress Notes (Signed)
Office Visit Note   Patient: Laura Burnett           Date of Birth: November 21, 1965           MRN: [id number] Visit Date: 11/16/2019              Requested by: Guadalupe Dawn, MD [phone],  [place] [id number] PCP: Guadalupe Dawn, MD   Assessment & Plan: Visit Diagnoses:  1. Trigger finger, right index finger     Plan: Dorsal DIP right index finger splint applied today with the portion of the tongue depressor and taped.  This should stop her locking until outpatient surgery time she can remove it when she washes her hands or takes a bath.  We discussed trigger finger release of the index finger with some IV sedation and local anesthesia.  Patient states she would like to proceed[place]   [place] [address].  Follow-Up Instructions: Preop for right index trigger finger release.  Orders:  No orders of the defined types were placed in this encounter.  No orders of the defined types were placed in this encounter.     Procedures: No procedures performed   Clinical Data: No additional findings.   Subjective: Chief Complaint  Patient presents with  . Right Index Finger - Pain  . Lower Back - Pain    HPI 54 year old female seen with greater than a month history of right index finger locking.  She had past history of injury to her right index finger with some ligament tears at the MCP joint 1993.  No problems with triggering at that time but she has had persistent locking despite dorsal splinting with several different splint as well as buddy taping to the long finger.  She did have problems with thumb triggering had injection this was not effective ultimately had trigger thumb release which corrected the problem.  She is used anti-inflammatories intermittent ice splints at night.  Patient is requesting being scheduled for right index trigger finger release.  She states the shot was very painful and does not want to have the shot since she is worried about persistent  triggering even after injection.   Review of Systems the patient had previous lumbar fusion.  Recent CT scan for abdominal pain and she asked that we review these images.  She states her back is been doing well and severe pain she had before revision with pseudoarthrosis has resolved.  Positive for thyromegaly anxiety disorder adjustment disorder with depressed mood previous carpal tunnel release on the right right trigger thumb release in the past.  Otherwise negative is obtains HPI.   Objective: Vital Signs: BP 103/73   Pulse (!) 106   Ht 5\' 1"  (1.549 m)   Wt 190 lb (86.2 kg)   BMI 35.90 kg/m   Physical Exam Constitutional:      Appearance: She is well-developed.  HENT:     Head: Normocephalic.     Right Ear: External ear normal.     Left Ear: External ear normal.  Eyes:     Pupils: Pupils are equal, round, and reactive to light.  Neck:     Thyroid: No thyromegaly.     Trachea: No tracheal deviation.  Cardiovascular:     Rate and Rhythm: Normal rate.  Pulmonary:     Effort: Pulmonary effort is normal.  Abdominal:     Palpations: Abdomen is soft.  Skin:    General: Skin is warm and dry.  Neurological:  Mental Status: She is alert and oriented to person, place, and time.  Psychiatric:        Behavior: Behavior normal.     Ortho Exam patient has severe tenderness over the A1 pulley right index finger palpable nodule and demonstrates active triggering with severe pain.  Carpal tunnel exam is negative good cervical range of motion reflexes are 2+ and symmetrical no triggering of any other digits right or left hand.  Two-point sensation is intact.  Specialty Comments:  No specialty comments available.  Imaging: No results found.   PMFS History: Patient Active Problem List   Diagnosis Date Noted  . Trigger finger, right index finger 11/17/2019  . Elevated alkaline phosphatase level 10/12/2019  . Abdominal pain, acute, epigastric 07/21/2019  . Candidal intertrigo  05/09/2019  . Carpal tunnel syndrome, right upper limb 12/16/2018  . Thyromegaly 10/18/2018  . Adjustment disorder with depressed mood 05/24/2017  . Hoarseness 04/21/2017  . Anxiety disorder 03/16/2017  . Lumbar radiculopathy 12/21/2016   Past Medical History:  Diagnosis Date  . Allergy    seasonal allergies/animals  . Anxiety   . Arthritis   . Asthma    as a child " exercise induced"  . Bronchitis   . Constipation   . Depression   . DJD (degenerative joint disease)   . Fibromyalgia   . GERD (gastroesophageal reflux disease)   . Headache   . Hypothyroidism   . Obesity   . PONV (postoperative nausea and vomiting)   . Wears glasses     Family History  Problem Relation Age of Onset  . Thyroid disease Sister   . Thyroid disease Brother   . Stomach cancer Brother   . Alzheimer's disease Mother   . Diabetes Mother   . Heart attack Father   . Colon cancer Father   . Heart failure Father   . Cancer Brother   . Cancer Brother   . Esophageal cancer Neg Hx   . Rectal cancer Neg Hx     Past Surgical History:  Procedure Laterality Date  . ABDOMINAL HYSTERECTOMY    . ADENOIDECTOMY    . APPLICATION OF ROBOTIC ASSISTANCE FOR SPINAL PROCEDURE N/A 12/21/2016   Procedure: APPLICATION OF ROBOTIC ASSISTANCE FOR SPINAL PROCEDURE;  Surgeon: Kevan Ny Ditty, MD;  Location: [facility];  Service: Neurosurgery;  Laterality: N/A;  . BACK SURGERY     L5-S1  . CARPAL TUNNEL RELEASE Right 01/16/2019   Procedure: RIGHT CARPAL TUNNEL RELEASE;  Surgeon: [name], MD;  Location: [facility];  Service: Orthopedics;  Laterality: Right;  . COLONOSCOPY    . CYST EXCISION     uterine ;subsequent sx followed  . FOOT SURGERY    . HAND SURGERY     from AA  . KNEE ARTHROSCOPY     x2  . TONSILLECTOMY    . TRIGGER FINGER RELEASE Right 01/16/2019   Procedure: RIGHT TRIGGER THUMB RELEASE;  Surgeon: [name], MD;  Location: [facility];  Service: Orthopedics;   Laterality: Right;  . UPPER GASTROINTESTINAL ENDOSCOPY     Social History   Occupational History  . Not on file  Tobacco Use  . Smoking status: Former Research scientist (life sciences)  . Smokeless tobacco: Never Used  . Tobacco comment: stopped smoking cigarettes at age 35  Substance and Sexual Activity  . Alcohol use: Yes    Comment: occasional beer  . Drug use: No    Comment:  per pt when she was 16 year  .  Sexual activity: Not Currently

## 2019-11-17 DIAGNOSIS — M65321 Trigger finger, right index finger: Secondary | ICD-10-CM | POA: Insufficient documentation

## 2019-11-28 ENCOUNTER — Other Ambulatory Visit: Payer: Self-pay

## 2019-11-28 ENCOUNTER — Encounter (HOSPITAL_BASED_OUTPATIENT_CLINIC_OR_DEPARTMENT_OTHER): Payer: Self-pay | Admitting: Orthopaedic Surgery

## 2019-11-30 ENCOUNTER — Other Ambulatory Visit (HOSPITAL_COMMUNITY): Payer: Medicare HMO

## 2019-11-30 ENCOUNTER — Other Ambulatory Visit (HOSPITAL_COMMUNITY)
Admission: RE | Admit: 2019-11-30 | Discharge: 2019-11-30 | Disposition: A | Discharge status: Home / Self Care | Payer: Medicare HMO | Source: Ambulatory Visit | Attending: Orthopaedic Surgery | Admitting: Orthopaedic Surgery

## 2019-11-30 ENCOUNTER — Other Ambulatory Visit: Payer: Self-pay

## 2019-11-30 DIAGNOSIS — Z20822 Contact with and (suspected) exposure to covid-19: Secondary | ICD-10-CM | POA: Diagnosis not present

## 2019-11-30 DIAGNOSIS — Z01812 Encounter for preprocedural laboratory examination: Secondary | ICD-10-CM | POA: Diagnosis present

## 2019-12-01 LAB — SARS CORONAVIRUS 2 (TAT 6-24 HRS): SARS Coronavirus 2: NEGATIVE

## 2019-12-04 ENCOUNTER — Ambulatory Visit (HOSPITAL_BASED_OUTPATIENT_CLINIC_OR_DEPARTMENT_OTHER): Payer: Medicare HMO | Admitting: Anesthesiology

## 2019-12-04 ENCOUNTER — Ambulatory Visit (HOSPITAL_BASED_OUTPATIENT_CLINIC_OR_DEPARTMENT_OTHER)
Admission: RE | Admit: 2019-12-04 | Discharge: 2019-12-04 | Disposition: A | Discharge status: Home / Self Care | Payer: Medicare HMO | Source: Ambulatory Visit | Attending: Orthopaedic Surgery | Admitting: Orthopaedic Surgery

## 2019-12-04 ENCOUNTER — Encounter (HOSPITAL_BASED_OUTPATIENT_CLINIC_OR_DEPARTMENT_OTHER)
Admission: RE | Disposition: A | Discharge status: Home / Self Care | Payer: Self-pay | Source: Ambulatory Visit | Attending: Orthopaedic Surgery

## 2019-12-04 ENCOUNTER — Other Ambulatory Visit: Payer: Self-pay

## 2019-12-04 ENCOUNTER — Encounter (HOSPITAL_BASED_OUTPATIENT_CLINIC_OR_DEPARTMENT_OTHER): Payer: Self-pay | Admitting: Orthopaedic Surgery

## 2019-12-04 DIAGNOSIS — Z87891 Personal history of nicotine dependence: Secondary | ICD-10-CM | POA: Insufficient documentation

## 2019-12-04 DIAGNOSIS — J45909 Unspecified asthma, uncomplicated: Secondary | ICD-10-CM | POA: Diagnosis not present

## 2019-12-04 DIAGNOSIS — M65321 Trigger finger, right index finger: Secondary | ICD-10-CM | POA: Diagnosis present

## 2019-12-04 DIAGNOSIS — M1990 Unspecified osteoarthritis, unspecified site: Secondary | ICD-10-CM | POA: Insufficient documentation

## 2019-12-04 DIAGNOSIS — Z6835 Body mass index (BMI) 35.0-35.9, adult: Secondary | ICD-10-CM | POA: Insufficient documentation

## 2019-12-04 DIAGNOSIS — E669 Obesity, unspecified: Secondary | ICD-10-CM | POA: Diagnosis not present

## 2019-12-04 HISTORY — PX: TRIGGER FINGER RELEASE: SHX641

## 2019-12-04 SURGERY — RELEASE, A1 PULLEY, FOR TRIGGER FINGER
Anesthesia: General | Site: Hand | Laterality: Right

## 2019-12-04 MED ORDER — DEXAMETHASONE SODIUM PHOSPHATE 10 MG/ML IJ SOLN
INTRAMUSCULAR | Status: AC
  Filled 2019-12-04: qty 1

## 2019-12-04 MED ORDER — MIDAZOLAM HCL 2 MG/2ML IJ SOLN
INTRAMUSCULAR | Status: AC
  Filled 2019-12-04: qty 2

## 2019-12-04 MED ORDER — OXYCODONE HCL 5 MG/5ML PO SOLN: 5.0000 mg | Freq: Once | ORAL | Status: AC | PRN

## 2019-12-04 MED ORDER — HYDROCODONE-ACETAMINOPHEN 5-325 MG PO TABS: 1.0000 | ORAL_TABLET | Freq: Four times a day (QID) | ORAL | 0 refills | Status: DC | PRN

## 2019-12-04 MED ORDER — ONDANSETRON HCL 4 MG/2ML IJ SOLN: 4.0000 mg | Freq: Once | INTRAMUSCULAR | Status: DC | PRN

## 2019-12-04 MED ORDER — MIDAZOLAM HCL 2 MG/2ML IJ SOLN: 1.0000 mg | INTRAMUSCULAR | Status: DC | PRN

## 2019-12-04 MED ORDER — CEFAZOLIN SODIUM-DEXTROSE 2-4 GM/100ML-% IV SOLN
INTRAVENOUS | Status: AC
  Filled 2019-12-04: qty 100

## 2019-12-04 MED ORDER — BUPIVACAINE HCL (PF) 0.25 % IJ SOLN
INTRAMUSCULAR | Status: AC
  Filled 2019-12-04: qty 30

## 2019-12-04 MED ORDER — OXYCODONE HCL 5 MG PO TABS
5.0000 mg | ORAL_TABLET | Freq: Once | ORAL | Status: AC | PRN
  Administered 2019-12-04: 5 mg via ORAL

## 2019-12-04 MED ORDER — PROPOFOL 10 MG/ML IV BOLUS
INTRAVENOUS | Status: AC
  Filled 2019-12-04: qty 20

## 2019-12-04 MED ORDER — FENTANYL CITRATE (PF) 100 MCG/2ML IJ SOLN: 50.0000 ug | INTRAMUSCULAR | Status: DC | PRN

## 2019-12-04 MED ORDER — FENTANYL CITRATE (PF) 100 MCG/2ML IJ SOLN
INTRAMUSCULAR | Status: AC
  Filled 2019-12-04: qty 2

## 2019-12-04 MED ORDER — OXYCODONE HCL 5 MG PO TABS
ORAL_TABLET | ORAL | Status: AC
  Filled 2019-12-04: qty 1

## 2019-12-04 MED ORDER — SCOPOLAMINE 1 MG/3DAYS TD PT72
MEDICATED_PATCH | TRANSDERMAL | Status: DC | PRN
  Administered 2019-12-04: 1 via TRANSDERMAL

## 2019-12-04 MED ORDER — BUPIVACAINE HCL (PF) 0.25 % IJ SOLN
INTRAMUSCULAR | Status: DC | PRN
  Administered 2019-12-04: 1 mL

## 2019-12-04 MED ORDER — LIDOCAINE-EPINEPHRINE 1 %-1:100000 IJ SOLN
INTRAMUSCULAR | Status: AC
  Filled 2019-12-04: qty 1

## 2019-12-04 MED ORDER — ONDANSETRON HCL 4 MG/2ML IJ SOLN
INTRAMUSCULAR | Status: AC
  Filled 2019-12-04: qty 2

## 2019-12-04 MED ORDER — PROPOFOL 10 MG/ML IV BOLUS
INTRAVENOUS | Status: DC | PRN
  Administered 2019-12-04: 200 mg via INTRAVENOUS
  Administered 2019-12-04: 100 mg via INTRAVENOUS

## 2019-12-04 MED ORDER — CEFAZOLIN SODIUM-DEXTROSE 2-4 GM/100ML-% IV SOLN
2.0000 g | INTRAVENOUS | Status: AC
  Administered 2019-12-04: 2 g via INTRAVENOUS

## 2019-12-04 MED ORDER — LIDOCAINE HCL (PF) 1 % IJ SOLN
INTRAMUSCULAR | Status: AC
  Filled 2019-12-04: qty 30

## 2019-12-04 MED ORDER — FENTANYL CITRATE (PF) 100 MCG/2ML IJ SOLN
25.0000 ug | INTRAMUSCULAR | Status: DC | PRN
  Administered 2019-12-04: 50 ug via INTRAVENOUS

## 2019-12-04 MED ORDER — FENTANYL CITRATE (PF) 100 MCG/2ML IJ SOLN
INTRAMUSCULAR | Status: DC | PRN
  Administered 2019-12-04: 50 ug via INTRAVENOUS

## 2019-12-04 MED ORDER — LIDOCAINE 2% (20 MG/ML) 5 ML SYRINGE
INTRAMUSCULAR | Status: DC | PRN
  Administered 2019-12-04: 50 mg via INTRAVENOUS

## 2019-12-04 MED ORDER — LIDOCAINE HCL (PF) 1 % IJ SOLN
INTRAMUSCULAR | Status: DC | PRN
  Administered 2019-12-04: 1 mL

## 2019-12-04 MED ORDER — PROPOFOL 500 MG/50ML IV EMUL
INTRAVENOUS | Status: AC
  Filled 2019-12-04: qty 50

## 2019-12-04 MED ORDER — LIDOCAINE 2% (20 MG/ML) 5 ML SYRINGE
INTRAMUSCULAR | Status: AC
  Filled 2019-12-04: qty 5

## 2019-12-04 MED ORDER — LACTATED RINGERS IV SOLN: INTRAVENOUS | Status: DC

## 2019-12-04 MED ORDER — ONDANSETRON HCL 4 MG/2ML IJ SOLN
INTRAMUSCULAR | Status: DC | PRN
  Administered 2019-12-04: 4 mg via INTRAVENOUS

## 2019-12-04 MED ORDER — DEXAMETHASONE SODIUM PHOSPHATE 10 MG/ML IJ SOLN
INTRAMUSCULAR | Status: DC | PRN
  Administered 2019-12-04: 10 mg via INTRAVENOUS

## 2019-12-04 MED ORDER — MIDAZOLAM HCL 2 MG/2ML IJ SOLN
INTRAMUSCULAR | Status: DC | PRN
  Administered 2019-12-04: 2 mg via INTRAVENOUS

## 2019-12-04 SURGICAL SUPPLY — 45 items
BENZOIN TINCTURE PRP APPL 2/3 (GAUZE/BANDAGES/DRESSINGS) IMPLANT
BLADE SURG 15 STRL LF DISP TIS (BLADE) ×1 IMPLANT
BLADE SURG 15 STRL SS (BLADE) ×2
BNDG CONFORM 2 STRL LF (GAUZE/BANDAGES/DRESSINGS) ×3 IMPLANT
BNDG ELASTIC 2X5.8 VLCR STR LF (GAUZE/BANDAGES/DRESSINGS) ×3 IMPLANT
BNDG ELASTIC 4X5.8 VLCR STR LF (GAUZE/BANDAGES/DRESSINGS) ×3 IMPLANT
BNDG ESMARK 4X9 LF (GAUZE/BANDAGES/DRESSINGS) ×3 IMPLANT
CLOSURE WOUND 1/2 X4 (GAUZE/BANDAGES/DRESSINGS)
CORD BIPOLAR FORCEPS 12FT (ELECTRODE) ×3 IMPLANT
COVER BACK TABLE 60X90IN (DRAPES) ×3 IMPLANT
COVER MAYO STAND STRL (DRAPES) ×3 IMPLANT
COVER WAND RF STERILE (DRAPES) IMPLANT
CUFF TOURN SGL QUICK 18X4 (TOURNIQUET CUFF) ×3 IMPLANT
DRAPE EXTREMITY T 121X128X90 (DISPOSABLE) ×3 IMPLANT
DRAPE SURG 17X23 STRL (DRAPES) ×3 IMPLANT
DURAPREP 26ML APPLICATOR (WOUND CARE) ×3 IMPLANT
GAUZE SPONGE 4X4 12PLY STRL (GAUZE/BANDAGES/DRESSINGS) ×3 IMPLANT
GAUZE XEROFORM 1X8 LF (GAUZE/BANDAGES/DRESSINGS) ×3 IMPLANT
GLOVE BIO SURGEON STRL SZ7 (GLOVE) ×6 IMPLANT
GLOVE BIO SURGEON STRL SZ7.5 (GLOVE) ×3 IMPLANT
GLOVE BIOGEL PI IND STRL 6.5 (GLOVE) ×1 IMPLANT
GLOVE BIOGEL PI IND STRL 7.5 (GLOVE) ×2 IMPLANT
GLOVE BIOGEL PI IND STRL 8 (GLOVE) ×1 IMPLANT
GLOVE BIOGEL PI INDICATOR 6.5 (GLOVE) ×2
GLOVE BIOGEL PI INDICATOR 7.5 (GLOVE) ×4
GLOVE BIOGEL PI INDICATOR 8 (GLOVE) ×2
GOWN STRL REUS W/ TWL LRG LVL3 (GOWN DISPOSABLE) ×2 IMPLANT
GOWN STRL REUS W/ TWL XL LVL3 (GOWN DISPOSABLE) ×1 IMPLANT
GOWN STRL REUS W/TWL LRG LVL3 (GOWN DISPOSABLE) ×4
GOWN STRL REUS W/TWL XL LVL3 (GOWN DISPOSABLE) ×2
LOOP VESSEL MAXI BLUE (MISCELLANEOUS) IMPLANT
NEEDLE HYPO 25X1 1.5 SAFETY (NEEDLE) ×3 IMPLANT
NS IRRIG 1000ML POUR BTL (IV SOLUTION) ×3 IMPLANT
PACK BASIN DAY SURGERY FS (CUSTOM PROCEDURE TRAY) ×3 IMPLANT
PAD CAST 3X4 CTTN HI CHSV (CAST SUPPLIES) ×1 IMPLANT
PADDING CAST ABS 4INX4YD NS (CAST SUPPLIES)
PADDING CAST ABS COTTON 4X4 ST (CAST SUPPLIES) IMPLANT
PADDING CAST COTTON 3X4 STRL (CAST SUPPLIES) ×2
STOCKINETTE 4X48 STRL (DRAPES) ×3 IMPLANT
STRIP CLOSURE SKIN 1/2X4 (GAUZE/BANDAGES/DRESSINGS) IMPLANT
SUT ETHILON 4 0 PS 2 18 (SUTURE) ×3 IMPLANT
SYR BULB 3OZ (MISCELLANEOUS) ×3 IMPLANT
SYR CONTROL 10ML LL (SYRINGE) IMPLANT
TOWEL GREEN STERILE FF (TOWEL DISPOSABLE) ×3 IMPLANT
UNDERPAD 30X36 HEAVY ABSORB (UNDERPADS AND DIAPERS) ×3 IMPLANT

## 2019-12-04 NOTE — Anesthesia Procedure Notes (Signed)
Procedure Name: LMA Insertion Date/Time: 12/04/2019 10:02 AM Performed by: [place]  C, CRNA Pre-anesthesia Checklist: Patient identified, Emergency Drugs available, Suction available and Patient being monitored Patient Re-evaluated:Patient Re-evaluated prior to induction Oxygen Delivery Method: Circle system utilized Preoxygenation: Pre-oxygenation with 100% oxygen Induction Type: IV induction Ventilation: Mask ventilation without difficulty LMA: LMA inserted LMA Size: 4.0 Number of attempts: 1 Airway Equipment and Method: Bite block Placement Confirmation: positive ETCO2 Tube secured with: Tape Dental Injury: Teeth and Oropharynx as per pre-operative assessment

## 2019-12-04 NOTE — Transfer of Care (Signed)
Immediate Anesthesia Transfer of Care Note  Patient: Laura Burnett  Procedure(s) Performed: right index trigger finger release (Right Hand)  Patient Location: PACU  Anesthesia Type:General  Level of Consciousness: awake and drowsy  Airway & Oxygen Therapy: Patient Spontanous Breathing and Patient connected to face mask oxygen  Post-op Assessment: Report given to RN and Post -op Vital signs reviewed and stable  Post vital signs: Reviewed and stable  Last Vitals:  Vitals Value Taken Time  BP 109/76 12/04/19 1045  Temp    Pulse 90 12/04/19 1047  Resp 15 12/04/19 1047  SpO2 100 % 12/04/19 1047  Vitals shown include unvalidated device data.  Last Pain:  Vitals:   12/04/19 0837  TempSrc: Temporal  PainSc: 8       Patients Stated Pain Goal: 8 ([id number])  Complications: No apparent anesthesia complications

## 2019-12-04 NOTE — Anesthesia Preprocedure Evaluation (Signed)
Anesthesia Evaluation  Patient identified by MRN, date of birth, ID band Patient awake    Reviewed: Allergy & Precautions, NPO status , Patient's Chart, lab work & pertinent test results  History of Anesthesia Complications (+) PONV  Airway Mallampati: II  TM Distance: >3 FB Neck ROM: Full    Dental no notable dental hx. (+) Teeth Intact, Caps   Pulmonary asthma , former smoker,    Pulmonary exam normal        Cardiovascular negative cardio ROS Normal cardiovascular exam     Neuro/Psych  Headaches, PSYCHIATRIC DISORDERS Anxiety Depression    GI/Hepatic Neg liver ROS, GERD  Medicated and Controlled,  Endo/Other  Hypothyroidism Obesity Hyperlipidemia  Renal/GU negative Renal ROS  negative genitourinary   Musculoskeletal  (+) Arthritis , Osteoarthritis,  Fibromyalgia -  Abdominal   Peds  Hematology negative hematology ROS (+)   Anesthesia Other Findings   Reproductive/Obstetrics                             Anesthesia Physical  Anesthesia Plan  ASA: II  Anesthesia Plan: General   Post-op Pain Management:    Induction: Intravenous  PONV Risk Score and Plan: 4 or greater and Scopolamine patch - Pre-op, Midazolam, Ondansetron, Dexamethasone and Treatment may vary due to age or medical condition  Airway Management Planned: LMA  Additional Equipment: None  Intra-op Plan:   Post-operative Plan: Extubation in OR  Informed Consent: I have reviewed the patients History and Physical, chart, labs and discussed the procedure including the risks, benefits and alternatives for the proposed anesthesia with the patient or authorized representative who has indicated his/her understanding and acceptance.     Dental advisory given  Plan Discussed with:   Anesthesia Plan Comments:         Anesthesia Quick Evaluation

## 2019-12-04 NOTE — Interval H&P Note (Signed)
History and Physical Interval Note:  12/04/2019 9:42 AM  Laura Burnett  has presented today for surgery, with the diagnosis of right index trigger finger.  The various methods of treatment have been discussed with the patient and family. After consideration of risks, benefits and other options for treatment, the patient has consented to  Procedure(s): right index trigger finger release (Right) as a surgical intervention.  The patient's history has been reviewed, patient examined, no change in status, stable for surgery.  I have reviewed the patient's chart and labs.  Questions were answered to the patient's satisfaction.     [name]

## 2019-12-04 NOTE — Discharge Instructions (Signed)
  Post Anesthesia Home Care Instructions  Activity: Get plenty of rest for the remainder of the day. A responsible individual must stay with you for 24 hours following the procedure.  For the next 24 hours, DO NOT: -Drive a car -Paediatric nurse -Drink alcoholic beverages -Take any medication unless instructed by your physician -Make any legal decisions or sign important papers.  Meals: Start with liquid foods such as gelatin or soup. Progress to regular foods as tolerated. Avoid greasy, spicy, heavy foods. If nausea and/or vomiting occur, drink only clear liquids until the nausea and/or vomiting subsides. Call your physician if vomiting continues.  Special Instructions/Symptoms: Your throat may feel dry or sore from the anesthesia or the breathing tube placed in your throat during surgery. If this causes discomfort, gargle with warm salt water. The discomfort should disappear within 24 hours.  If you had a scopolamine patch placed behind your ear for the management of post- operative nausea and/or vomiting:  1. The medication in the patch is effective for 72 hours, after which it should be removed.  Wrap patch in a tissue and discard in the trash. Wash hands thoroughly with soap and water. 2. You may remove the patch earlier than 72 hours if you experience unpleasant side effects which may include dry mouth, dizziness or visual disturbances. 3. Avoid touching the patch. Wash your hands with soap and water after contact with the patch.       Leave dressing on until Wednesday then you can remove it and apply band aid to incision after washing your hand. OK to shower with dressing off after Wednesday. If you shower before then cover dressing with a bag and tape to keep dressing dry. 10 tablets for pain was sent in to your pharmacy in [place]. Keep hand elevated and you can appy ice to help the pain. OK to move your fingers to prevent stiffness. See Dr. Lorin Mercy next Thursday in [facility] , call  for appt time if you do not already have one.

## 2019-12-04 NOTE — Anesthesia Postprocedure Evaluation (Signed)
Anesthesia Post Note  Patient: Laura Burnett  Procedure(s) Performed: right index trigger finger release (Right Hand)     Patient location during evaluation: PACU Anesthesia Type: General Level of consciousness: awake and alert Pain management: pain level controlled Vital Signs Assessment: post-procedure vital signs reviewed and stable Respiratory status: spontaneous breathing, nonlabored ventilation and respiratory function stable Cardiovascular status: blood pressure returned to baseline and stable Postop Assessment: no apparent nausea or vomiting Anesthetic complications: no    Last Vitals:  Vitals:   12/04/19 1145 12/04/19 1236  BP: 116/90 (!) 128/91  Pulse: 86 85  Resp: (!) 23   Temp: 36.5 C 36.7 C  SpO2: 98% 97%    Last Pain:  Vitals:   12/04/19 1236  TempSrc:   PainSc: [place]

## 2019-12-04 NOTE — Op Note (Signed)
Preop diagnosis: Right index trigger finger.  Postop diagnosis: Same  Procedure: Right index trigger finger release.  Surgeon: Rodell Perna, MD  Anesthesia LMA plus Marcaine lidocaine local 3 cc total.  Tourniquet: 250 x 9 minutes.  Procedure: After induction of anesthesia forearm tourniquet prepping with DuraPrep Ancef prophylaxis usual prepping and draping extremity sheets and drapes were applied with stockinette.  Sterile skin marker was used over the prominent A1 pulley with flexor tendon mass was noted.  Patient had a finger stuck in flexion and not been able to fully extend it for the last 2 weeks.  After timeout procedure completed arm was wrapped with an Esmarch tourniquet inflated.  Incision was made transverse and then blunt dissection in the midline directly down to the flexor tendon sheath was performed with Ragnell retractors placed right and left protect the neurovascular bundle on each side.  With direct visualization loupe magnification the tendon sheath was visualized a nick was made and then extended with Annie Main scissors pending through the A1 pulley making sure there is complete release proximal and distal.  Once this was divided index finger would reach full extension and can hyperextend.  Repeat visualization make sure there was complete release of the A1 pulley.  No palpable nodules underneath the A2 pulley and PIP/DIP fully extended.  Irrigation saline solution tourniquet deflation 1 tiny subcu vein coagulated with bipolar cautery.  Nylon 4-0 interrupted skin closure followed by Xeroform 4 x 4 2 inch Kling and small Ace wrap was applied for postoperative dressing.  Patient tolerated procedure well and was transferred to care room in stable condition.

## 2019-12-04 NOTE — H&P (Signed)
Patient: Laura Burnett  Date of Birth: [birth year]  MRN: [id number]  Visit Date: 11/16/2019  Requested by: Guadalupe Dawn, MD  [phone], [place] [address]  PCP: Guadalupe Dawn, MD  Assessment & Plan:  Visit Diagnoses:  1. Trigger finger, right index finger    Plan: Dorsal DIP right index finger splint applied today with the portion of the tongue depressor and taped. This should stop her locking until outpatient surgery time she can remove it when she washes her hands or takes a bath. We discussed trigger finger release of the index finger with some IV sedation and local anesthesia. Patient states she would like to proceed[place] [address].  Follow-Up Instructions: Preop for right index trigger finger release.  Orders:  No orders of the defined types were placed in this encounter.   No orders of the defined types were placed in this encounter.   Procedures:  No procedures performed  Clinical Data:  No additional findings.  Subjective:     Chief Complaint  Patient presents with  . Right Index Finger - Pain  . Lower Back - Pain   HPI 54 year old female seen with greater than a month history of right index finger locking. She had past history of injury to her right index finger with some ligament tears at the MCP joint 1993. No problems with triggering at that time but she has had persistent locking despite dorsal splinting with several different splint as well as buddy taping to the long finger. She did have problems with thumb triggering had injection this was not effective ultimately had trigger thumb release which corrected the problem. She is used anti-inflammatories intermittent ice splints at night. Patient is requesting being scheduled for right index trigger finger release. She states the shot was very painful and does not want to have the shot since she is worried about persistent triggering even after injection.  Review of Systems the patient had previous  lumbar fusion. Recent CT scan for abdominal pain and she asked that we review these images. She states her back is been doing well and severe pain she had before revision with pseudoarthrosis has resolved. Positive for thyromegaly anxiety disorder adjustment disorder with depressed mood previous carpal tunnel release on the right right trigger thumb release in the past. Otherwise negative is obtains HPI.  Objective:  Vital Signs: BP 103/73  Pulse (!) 106  Ht 5\' 1"  (1.549 m)  Wt 190 lb (86.2 kg)  BMI 35.90 kg/m  Physical Exam  Constitutional:  Appearance: She is well-developed.  HENT:  Head: Normocephalic.  Right Ear: External ear normal.  Left Ear: External ear normal.  Eyes:  Pupils: Pupils are equal, round, and reactive to light.  Neck:  Thyroid: No thyromegaly.  Trachea: No tracheal deviation.  Cardiovascular:  Rate and Rhythm: Normal rate.  Pulmonary:  Effort: Pulmonary effort is normal.  Abdominal:  Palpations: Abdomen is soft.  Skin:  General: Skin is warm and dry.  Neurological:  Mental Status: She is alert and oriented to person, place, and time.  Psychiatric:  Behavior: Behavior normal.   Ortho Exam patient has severe tenderness over the A1 pulley right index finger palpable nodule and demonstrates active triggering with severe pain. Carpal tunnel exam is negative good cervical range of motion reflexes are 2+ and symmetrical no triggering of any other digits right or left hand. Two-point sensation is intact.  Specialty Comments:  No specialty comments available.  Imaging:  No results found.  PMFS History:      Patient Active Problem List   Diagnosis Date Noted  . Trigger finger, right index finger 11/17/2019  . Elevated alkaline phosphatase level 10/12/2019  . Abdominal pain, acute, epigastric 07/21/2019  . Candidal intertrigo 05/09/2019  . Carpal tunnel syndrome, right upper limb 12/16/2018  . Thyromegaly 10/18/2018  . Adjustment disorder with depressed mood  05/24/2017  . Hoarseness 04/21/2017  . Anxiety disorder 03/16/2017  . Lumbar radiculopathy 12/21/2016       Past Medical History:  Diagnosis Date  . Allergy    seasonal allergies/animals  . Anxiety   . Arthritis   . Asthma    as a child " exercise induced"  . Bronchitis   . Constipation   . Depression   . DJD (degenerative joint disease)   . Fibromyalgia   . GERD (gastroesophageal reflux disease)   . Headache   . Hypothyroidism   . Obesity   . PONV (postoperative nausea and vomiting)   . Wears glasses         Family History  Problem Relation Age of Onset  . Thyroid disease Sister   . Thyroid disease Brother   . Stomach cancer Brother   . Alzheimer's disease Mother   . Diabetes Mother   . Heart attack Father   . Colon cancer Father   . Heart failure Father   . Cancer Brother   . Cancer Brother   . Esophageal cancer Neg Hx   . Rectal cancer Neg Hx         Past Surgical History:  Procedure Laterality Date  . ABDOMINAL HYSTERECTOMY    . ADENOIDECTOMY    . APPLICATION OF ROBOTIC ASSISTANCE FOR SPINAL PROCEDURE N/A 12/21/2016   Procedure: APPLICATION OF ROBOTIC ASSISTANCE FOR SPINAL PROCEDURE; Surgeon: Kevan Ny Ditty, MD; Location: [facility]; Service: Neurosurgery; Laterality: N/A;  . BACK SURGERY     L5-S1  . CARPAL TUNNEL RELEASE Right 01/16/2019   Procedure: RIGHT CARPAL TUNNEL RELEASE; Surgeon: [name], MD; Location: [facility]; Service: Orthopedics; Laterality: Right;  . COLONOSCOPY    . CYST EXCISION     uterine ;subsequent sx followed  . FOOT SURGERY    . HAND SURGERY     from [facility]  . KNEE ARTHROSCOPY     x2  . TONSILLECTOMY    . TRIGGER FINGER RELEASE Right 01/16/2019   Procedure: RIGHT TRIGGER THUMB RELEASE; Surgeon: [name], MD; Location: [facility]; Service: Orthopedics; Laterality: Right;  . UPPER GASTROINTESTINAL ENDOSCOPY     Social History        Occupational History  . Not on file  Tobacco Use  .  Smoking status: Former Research scientist (life sciences)  . Smokeless tobacco: Never Used  . Tobacco comment: stopped smoking cigarettes at age 7  Substance and Sexual Activity  . Alcohol use: Yes    Comment: occasional beer  . Drug use: No    Comment: per pt when she was 16 year  . Sexual activity: Not Currently

## 2019-12-05 ENCOUNTER — Encounter: Payer: Self-pay | Admitting: *Deleted

## 2019-12-05 ENCOUNTER — Telehealth: Payer: Self-pay | Admitting: Radiology

## 2019-12-05 NOTE — Telephone Encounter (Signed)
Patient called [facility] office this morning and states that her throat is hurting and she is coughing. She would like to know if you could call her some cough medicine in to [facility] in [place]?  She had right index trigger finger release yesterday.  Please advise.  CB for patient is [phone]

## 2019-12-05 NOTE — Telephone Encounter (Signed)
I called discussed. Use cough drops etc.

## 2019-12-14 ENCOUNTER — Encounter: Payer: Self-pay | Admitting: Orthopaedic Surgery

## 2019-12-14 ENCOUNTER — Ambulatory Visit (INDEPENDENT_AMBULATORY_CARE_PROVIDER_SITE_OTHER): Payer: Medicare HMO | Admitting: Orthopaedic Surgery

## 2019-12-14 ENCOUNTER — Other Ambulatory Visit: Payer: Self-pay

## 2019-12-14 VITALS — Ht 61.0 in | Wt 190.0 lb

## 2019-12-14 DIAGNOSIS — M65321 Trigger finger, right index finger: Secondary | ICD-10-CM

## 2019-12-14 NOTE — Progress Notes (Signed)
Postop right trigger finger release right index finger.  She has little stiffness of the finger sutures are harvested incision looks good no triggering.  She can follow-up as needed.  She is happy the results of surgery.

## 2020-01-03 ENCOUNTER — Other Ambulatory Visit: Payer: Self-pay | Admitting: Family Medicine

## 2020-01-11 ENCOUNTER — Ambulatory Visit (INDEPENDENT_AMBULATORY_CARE_PROVIDER_SITE_OTHER): Payer: Medicare HMO | Admitting: Family Medicine

## 2020-01-11 ENCOUNTER — Encounter: Payer: Self-pay | Admitting: Family Medicine

## 2020-01-11 ENCOUNTER — Other Ambulatory Visit: Payer: Self-pay

## 2020-01-11 VITALS — BP 100/80 | HR 97 | Ht 61.0 in | Wt 194.0 lb

## 2020-01-11 DIAGNOSIS — B078 Other viral warts: Secondary | ICD-10-CM | POA: Diagnosis not present

## 2020-01-11 DIAGNOSIS — L280 Lichen simplex chronicus: Secondary | ICD-10-CM | POA: Diagnosis not present

## 2020-01-11 DIAGNOSIS — B079 Viral wart, unspecified: Secondary | ICD-10-CM

## 2020-01-11 MED ORDER — HYDROCORTISONE 2.5 % EX OINT: TOPICAL_OINTMENT | Freq: Two times a day (BID) | CUTANEOUS | 3 refills | Status: DC

## 2020-01-11 MED ORDER — CLOTRIMAZOLE 1 % EX CREA: 1.0000 "application " | TOPICAL_CREAM | Freq: Two times a day (BID) | CUTANEOUS | 0 refills | Status: DC

## 2020-01-11 NOTE — Patient Instructions (Signed)
Please alternate creams applied to the vaginal area once each day.   Follow up in two weeks to have the wart frozen off.

## 2020-01-15 DIAGNOSIS — L280 Lichen simplex chronicus: Secondary | ICD-10-CM | POA: Insufficient documentation

## 2020-01-15 DIAGNOSIS — B079 Viral wart, unspecified: Secondary | ICD-10-CM | POA: Insufficient documentation

## 2020-01-15 NOTE — Assessment & Plan Note (Signed)
Patient has what appears to be an anogenital wart.  Recommend cryotherapy at follow-up.

## 2020-01-15 NOTE — Progress Notes (Signed)
    SUBJECTIVE:   CHIEF COMPLAINT / HPI:   Patient follows up for 2 different vaginal lesions.  They both been bothering her for "a long time".  One has been itchy, sometimes painful on the left inner thigh.  This was been there off and on for several years.  The other one is located below the vagina.  This 1 has been there for about 4 months.  Patient was worked up for one of them and was found to be HPV positive and was felt that it was a wart. The other 1 she has tried topical hydrocortisone without much improvement.   PERTINENT  PMH / PSH: Noncontributory  OBJECTIVE:   BP 100/80   Pulse 97   Ht 5\' 1"  (1.549 m)   Wt 194 lb (88 kg)   SpO2 96%   BMI 36.66 kg/m    GU/Skin: Patient has left intertrigonous lesion located next to the left labia, it is a violaceous patch.  There is a second perineal lesion with a bifurcated head that appears verrucous.  They are nontender to palpation, there is no pus or drainage  ASSESSMENT/PLAN:   Problem List Items Addressed This Visit      Musculoskeletal and Integument   Lichen simplex chronicus - Primary    Patient has what appears to be a chronic intertrigo that has been excoriated to the point that it was become lichen simplex chronicus.  Cannot determine if the original room was fungal or not.  Recommend a alternating strategy of clotrimazole and hydrocortisone to see if there is any improvement in the area.  Patient to follow-up in a few weeks if she has any improvement.      Relevant Medications   hydrocortisone 2.5 % ointment   clotrimazole (LOTRIMIN) 1 % cream     Other   Verruca    Patient has what appears to be an anogenital wart.  Recommend cryotherapy at follow-up.      Relevant Medications   clotrimazole (LOTRIMIN) 1 % cream      Bonnita Hollow, MD [place]

## 2020-01-15 NOTE — Assessment & Plan Note (Signed)
Patient has what appears to be a chronic intertrigo that has been excoriated to the point that it was become lichen simplex chronicus.  Cannot determine if the original room was fungal or not.  Recommend a alternating strategy of clotrimazole and hydrocortisone to see if there is any improvement in the area.  Patient to follow-up in a few weeks if she has any improvement.

## 2020-01-16 ENCOUNTER — Other Ambulatory Visit: Payer: Self-pay | Admitting: Family Medicine

## 2020-01-16 DIAGNOSIS — L280 Lichen simplex chronicus: Secondary | ICD-10-CM

## 2020-01-16 MED ORDER — HYDROCORTISONE 2.5 % EX OINT: TOPICAL_OINTMENT | Freq: Two times a day (BID) | CUTANEOUS | 3 refills | Status: DC

## 2020-01-24 ENCOUNTER — Other Ambulatory Visit: Payer: Self-pay | Admitting: Family Medicine

## 2020-01-25 ENCOUNTER — Ambulatory Visit (INDEPENDENT_AMBULATORY_CARE_PROVIDER_SITE_OTHER): Payer: Medicare HMO | Admitting: Family Medicine

## 2020-01-25 ENCOUNTER — Other Ambulatory Visit: Payer: Self-pay

## 2020-01-25 ENCOUNTER — Encounter: Payer: Self-pay | Admitting: Family Medicine

## 2020-01-25 DIAGNOSIS — J302 Other seasonal allergic rhinitis: Secondary | ICD-10-CM | POA: Diagnosis not present

## 2020-01-25 DIAGNOSIS — B078 Other viral warts: Secondary | ICD-10-CM | POA: Diagnosis not present

## 2020-01-25 DIAGNOSIS — B079 Viral wart, unspecified: Secondary | ICD-10-CM

## 2020-01-25 MED ORDER — CETIRIZINE HCL 10 MG PO TABS: 10.0000 mg | ORAL_TABLET | Freq: Every day | ORAL | 1 refills | Status: DC

## 2020-01-25 MED ORDER — IMIQUIMOD 5 % EX CREA
TOPICAL_CREAM | CUTANEOUS | 0 refills | Status: DC
Start: 2020-01-26 — End: 2020-02-21

## 2020-01-25 NOTE — Patient Instructions (Signed)
It was great seeing you today!  I think that your head congestion, headache, and sore throat are likely due to a combination of seasonal allergies versus a viral upper respiratory infection.  For the allergies I will send in Zyrtec which inclination with your Flonase should give you a lot of relief.  For your wart that I saw we did cryotherapy.  This is a good destructive therapy to help with the lesion, in order to continue to the instructive process and prevent it from coming back will use something called imiquimod cream.  You will apply this cream to the affected area 3 times per week for 4 weeks.  You can then follow-up with me or in our dermatology clinic for a recheck.

## 2020-01-26 ENCOUNTER — Encounter: Payer: Self-pay | Admitting: Family Medicine

## 2020-01-26 DIAGNOSIS — J302 Other seasonal allergic rhinitis: Secondary | ICD-10-CM | POA: Insufficient documentation

## 2020-01-26 NOTE — Assessment & Plan Note (Addendum)
I discussed the risk benefits alternatives to cryotherapy including oftentimes more than 1 application, cosmetic deficits, the radical risk of infection, and recurrence of the lesion.  A signed consent was placed in the patient's chart.  Cryotherapy applied in the usual fashion.  Prescription given for imiquimod cream.  Apply 3 times weekly for 4 weeks, follow-up in clinic for recheck.

## 2020-01-26 NOTE — Assessment & Plan Note (Addendum)
Continue Flonase 2 sprays in each nostril daily.  Will add on Zyrtec 10 mg daily.  Follow-up as needed.

## 2020-01-26 NOTE — Progress Notes (Signed)
.    CHIEF COMPLAINT / HPI: 54 year old female who presents for wart removal.  The patient has had a known verrucous wart in her perineal area and presents for cryotherapy for this.  Patient has no other known verrucous lesions.  Patient states that she has also had increased allergies recently.  She has been taking her Flonase but states that her symptoms have been worse recently.  This in the past she has sometimes required Zyrtec for her allergies.  She has had itching, red eyes, rhinorrhea, and some sinus pressure.   PERTINENT  PMH / PSH:     OBJECTIVE: BP 100/66   Pulse (!) 101   Ht 5\' 1"  (1.549 m)   Wt 195 lb 6.4 oz (88.6 kg)   SpO2 95%   BMI 36.92 kg/m   Gen: 54 year old female, no acute distress, resting comfortably HEENT: Boggy nasal turbinates, mild sinus pressure to palpation, mild tearing of eyes, rhinorrhea present. Resp: No accessory muscle use, no respiratory distress Neuro: Alert and oriented, Speech clear, No gross deficits  GU: Hypopigmented raised verrucous lesion.  Located in perineal area, approximately 5 cm away from the 10 o'clock position of her anus.  ASSESSMENT / PLAN:  Verruca I discussed the risk benefits alternatives to cryotherapy including oftentimes more than 1 application, cosmetic deficits, the radical risk of infection, and recurrence of the lesion.  A signed consent was placed in the patient's chart.  Cryotherapy applied in the usual fashion.  Prescription given for imiquimod cream.  Apply 3 times weekly for 4 weeks, follow-up in clinic for recheck.  Seasonal allergies Continue Flonase 2 sprays in each nostril daily.  Will add on Zyrtec 10 mg daily.  Follow-up as needed.   Guadalupe Dawn MD PGY-3 Family Medicine Resident [place]

## 2020-02-02 ENCOUNTER — Telehealth: Payer: Self-pay

## 2020-02-02 NOTE — Telephone Encounter (Signed)
Patient calls nurse line reporting a "burning" pain at cryo site. Patient reports she has been applying aldara cream to wart site, however she feels the cream is "running" all over her vagina, therefore causing discomfort. Patient reports she has only applied two applications of cream. Patient does not feel the area is infected, "everything looks fine down there," just red and irritated. Patient reports she does expect some discomfort, however wanting to make sure it PCP feels she should continue cream. Patient feels she is unsure if cream is even helping, she feels the cryo therapy "made it better." Please advise.

## 2020-02-05 NOTE — Telephone Encounter (Signed)
Patient contacted and informed to stop cream if she prefers. Patient planning to come in at follow-up apt. Patient is requesting another round of cryo thearpy if needed. Advised patient he may need to evaluate first and possible come back. Patient ok with plan.

## 2020-02-05 NOTE — Telephone Encounter (Signed)
Burning pain at the cryo site is expected given the nature of that procedure. If the imiquimod cream is causing her issues I would recommend stopping using the cream. She can follow up in 4 weeks or so as usual to re-evaluate if the wart is still present or recurs.  Laura Dawn MD PGY-3 Family Medicine Resident

## 2020-02-12 ENCOUNTER — Other Ambulatory Visit: Payer: Self-pay | Admitting: Family Medicine

## 2020-02-12 DIAGNOSIS — R1013 Epigastric pain: Secondary | ICD-10-CM
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
## 2020-02-21 ENCOUNTER — Encounter: Payer: Self-pay | Admitting: Family Medicine

## 2020-02-21 ENCOUNTER — Other Ambulatory Visit: Payer: Self-pay

## 2020-02-21 ENCOUNTER — Ambulatory Visit (INDEPENDENT_AMBULATORY_CARE_PROVIDER_SITE_OTHER): Payer: Medicare HMO | Admitting: Family Medicine

## 2020-02-21 DIAGNOSIS — B078 Other viral warts: Secondary | ICD-10-CM | POA: Diagnosis not present

## 2020-02-21 DIAGNOSIS — J302 Other seasonal allergic rhinitis: Secondary | ICD-10-CM | POA: Diagnosis not present

## 2020-02-21 DIAGNOSIS — B079 Viral wart, unspecified: Secondary | ICD-10-CM

## 2020-02-21 MED ORDER — LEVOCETIRIZINE DIHYDROCHLORIDE 2.5 MG/5ML PO SOLN: 5.0000 mg | Freq: Every evening | ORAL | 1 refills | Status: DC

## 2020-02-21 NOTE — Assessment & Plan Note (Addendum)
Lesion smaller in more hypopigmented area than at previous inspection.  Underwent another round of cryotherapy at today's visit.  Will discontinue imiquimod therapy.  Can follow-up in 4 weeks if lesion still persistent and can undergo another round of cryotherapy, otherwise follow-up as needed.  Cryotherapy tolerated well.

## 2020-02-21 NOTE — Patient Instructions (Signed)
Was great seeing you again today!  Today we performed cryotherapy on that wart again.  It does look a lot smaller and improved from the last time I did it.  We can sometimes require a third dose of cryotherapy to fully eradicate it.  Keep an eye on it and if it still present let me know in 4 weeks.  Believe your symptoms consistent with allergies.  We will switch you from Zyrtec to [place].  Your insurance will cover the liquid formulation of this so I sent that in.  Will take 5 mg daily

## 2020-02-21 NOTE — Assessment & Plan Note (Addendum)
Patient with increased allergy symptoms.  Will use Flonase 2 sprays daily each nostril.  We will switch from Zyrtec to Xyzal 5 mg.  Can follow-up as needed.

## 2020-02-21 NOTE — Progress Notes (Signed)
   CHIEF COMPLAINT / HPI: 54 year old female who presents for follow-up for wart removal.  Patient last seen on 01/25/2020 for cryotherapy for the peroneal verrucous wart.  Patient states that she feels the cryotherapy was very effective and has noticeably decreased the size of the lesion, but it is still there.  He also was given imiquimod cream as adjunct therapy for this.  He states that the imiquimod cream burned significantly in the acute phase after the cryotherapy and "ran everywhere".  She wishes to discontinue the imiquimod cream.  She has no new findings or symptoms in this area.  She states that her allergies have been a little worse recently.  She has been using her Flonase as prescribed.  She has been on Xyzal as well for 3 to 4 months at this point.  PERTINENT  PMH / PSH: Verrucous wart of the perineum   OBJECTIVE: BP 98/72   Pulse (!) 102   Ht 5\' 1"  (1.549 m)   Wt 196 lb 3.2 oz (89 kg)   SpO2 97%   BMI 37.07 kg/m   Gen: Pleasant 54 year old Caucasian female, no acute distress CV: Skin warm and dry Resp: Nonlabored respirations, no respiratory distress Neuro: Alert and oriented, Speech clear, No gross deficits GU: Hypopigmented raised verrucous lesion located in perineal area, approximately 4 to 5 cm away from o'clock position of her anus.  This lesion appears smaller in size than the previous exam, and more hypopigmented.  ASSESSMENT / PLAN:  Verruca Lesion smaller in more hypopigmented area than at previous inspection.  Underwent another round of cryotherapy at today's visit.  Will discontinue imiquimod therapy.  Can follow-up in 4 weeks if lesion still persistent and can undergo another round of cryotherapy, otherwise follow-up as needed.  Cryotherapy tolerated well.  Seasonal allergies Patient with increased allergy symptoms.  Will use Flonase 2 sprays daily each nostril.  We will switch from Zyrtec to Xyzal 5 mg.  Can follow-up as needed.    Guadalupe Dawn MD PGY-3  Family Medicine Resident [place]

## 2020-02-26 ENCOUNTER — Telehealth (INDEPENDENT_AMBULATORY_CARE_PROVIDER_SITE_OTHER): Payer: Medicare HMO | Admitting: Psychiatry

## 2020-02-26 ENCOUNTER — Encounter (HOSPITAL_COMMUNITY): Payer: Self-pay | Admitting: Psychiatry

## 2020-02-26 ENCOUNTER — Other Ambulatory Visit: Payer: Self-pay

## 2020-02-26 DIAGNOSIS — F331 Major depressive disorder, recurrent, moderate: Secondary | ICD-10-CM | POA: Diagnosis not present

## 2020-02-26 DIAGNOSIS — R5383 Other fatigue: Secondary | ICD-10-CM | POA: Diagnosis not present

## 2020-02-26 MED ORDER — NORTRIPTYLINE HCL 50 MG PO CAPS: 50.0000 mg | ORAL_CAPSULE | Freq: Every day | ORAL | 1 refills | Status: DC

## 2020-02-26 MED ORDER — DIAZEPAM 2 MG PO TABS: 2.0000 mg | ORAL_TABLET | Freq: Every day | ORAL | 1 refills | Status: DC | PRN

## 2020-02-26 NOTE — Patient Instructions (Signed)
1. Continue duloxetine 120 mg daily 2. Increase nortriptyline 50 mg at night  3. Continuevalium 2 mg dailyas needed for anxiety 4.Next appointment: 8/16 at 3 PM 5. Referral for therapy  6. Referral for sleep evaluation

## 2020-03-19 ENCOUNTER — Other Ambulatory Visit: Payer: Self-pay

## 2020-03-20 NOTE — Telephone Encounter (Signed)
Patient LVM on nurse line to check status of rx refill.  ° ° C , RN ° °

## 2020-03-21 MED ORDER — GABAPENTIN 800 MG PO TABS: 800.0000 mg | ORAL_TABLET | Freq: Three times a day (TID) | ORAL | 0 refills | Status: DC

## 2020-04-01 ENCOUNTER — Telehealth: Payer: Self-pay

## 2020-04-02 ENCOUNTER — Other Ambulatory Visit: Payer: Self-pay

## 2020-04-03 MED ORDER — CYCLOBENZAPRINE HCL 10 MG PO TABS: 10.0000 mg | ORAL_TABLET | Freq: Three times a day (TID) | ORAL | 0 refills | Status: DC | PRN

## 2020-04-08 ENCOUNTER — Other Ambulatory Visit: Payer: Self-pay | Admitting: *Deleted

## 2020-04-08 MED ORDER — CYCLOBENZAPRINE HCL 10 MG PO TABS: 10.0000 mg | ORAL_TABLET | Freq: Three times a day (TID) | ORAL | 0 refills | Status: DC | PRN
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
## 2020-04-12 ENCOUNTER — Encounter (HOSPITAL_COMMUNITY): Payer: Self-pay | Admitting: Psychiatry

## 2020-04-12 ENCOUNTER — Telehealth (INDEPENDENT_AMBULATORY_CARE_PROVIDER_SITE_OTHER): Payer: Medicare HMO | Admitting: Psychiatry

## 2020-04-12 ENCOUNTER — Other Ambulatory Visit: Payer: Self-pay

## 2020-04-12 DIAGNOSIS — F330 Major depressive disorder, recurrent, mild: Secondary | ICD-10-CM | POA: Diagnosis not present

## 2020-04-12 MED ORDER — DULOXETINE HCL 60 MG PO CPEP: 120.0000 mg | ORAL_CAPSULE | Freq: Every day | ORAL | 0 refills | Status: DC

## 2020-04-12 MED ORDER — NORTRIPTYLINE HCL 50 MG PO CAPS: 50.0000 mg | ORAL_CAPSULE | Freq: Every day | ORAL | 0 refills | Status: DC

## 2020-04-12 MED ORDER — DIAZEPAM 2 MG PO TABS: 2.0000 mg | ORAL_TABLET | Freq: Every day | ORAL | 1 refills | Status: DC | PRN

## 2020-04-12 NOTE — Patient Instructions (Signed)
1. Continue duloxetine 120 mg daily 2.Continuenortriptyline 50 mg at night  3. Continuevalium 2 mg dailyas needed for anxiety 4.Next appointment: 10/8 at 10:40

## 2020-04-15 ENCOUNTER — Telehealth (HOSPITAL_COMMUNITY): Payer: Medicare HMO | Admitting: Psychiatry

## 2020-04-24 ENCOUNTER — Telehealth: Payer: Self-pay

## 2020-04-24 NOTE — Telephone Encounter (Signed)
Patient LVM reporting chills, but no "actual" fever. Patient reports body aches, especially in her arms. I attempted to call patient back, however I had to LVM. I see where she went to ED.

## 2020-04-25 NOTE — Telephone Encounter (Signed)
Opened in error  . R , CMA  

## 2020-04-30 ENCOUNTER — Ambulatory Visit (INDEPENDENT_AMBULATORY_CARE_PROVIDER_SITE_OTHER): Payer: Medicare HMO | Admitting: Family Medicine

## 2020-04-30 ENCOUNTER — Other Ambulatory Visit: Payer: Self-pay

## 2020-04-30 VITALS — BP 100/60 | HR 99 | Ht 61.0 in | Wt 195.5 lb

## 2020-04-30 DIAGNOSIS — R918 Other nonspecific abnormal finding of lung field: Secondary | ICD-10-CM | POA: Diagnosis not present

## 2020-04-30 DIAGNOSIS — R109 Unspecified abdominal pain: Secondary | ICD-10-CM

## 2020-04-30 DIAGNOSIS — R0602 Shortness of breath: Secondary | ICD-10-CM

## 2020-04-30 DIAGNOSIS — R9389 Abnormal findings on diagnostic imaging of other specified body structures: Secondary | ICD-10-CM

## 2020-04-30 MED ORDER — ALBUTEROL SULFATE HFA 108 (90 BASE) MCG/ACT IN AERS: 2.0000 | INHALATION_SPRAY | RESPIRATORY_TRACT | 1 refills | Status: DC | PRN

## 2020-04-30 NOTE — Progress Notes (Signed)
   SUBJECTIVE:   CHIEF COMPLAINT / HPI:   Chief Complaint  Patient presents with  . Follow-up    ed     Laura Burnett is a 54 y.o. female here for lung mass.  Lung Mass Patient seen in ED and was treated for pnuemonia on 04/24/20.  Patient taking doxyclycine (10 day course prescribed). Last abx pill should be on Saturday.  She has not been checking her O2 statsat home. Denies fever, cough, chest pain.  Endorses shortness of breath.  Additionally, she was told she had a mass on her lung. Denies weight loss, night sweats however endorses fatigue.   Patient smoked cigarettes when she was a teenager for about 4-5 years about 1 ppd. She quit when she was 54 yo. Reports her son smokes around her.   Patient has been using Albuterol at least 3 times a day. Shortness of breath since 04/22/20. Received inhalers when she was having issues with her allergies about 6 months ago.      PERTINENT  PMH / PSH:  reviewed and updated as appropriate   OBJECTIVE:   BP 100/60   Pulse 99   Ht 5\' 1"  (1.549 m)   Wt 195 lb 8 oz (88.7 kg)   SpO2 96%   BMI 36.94 kg/m    GEN: pleasant older female, well appearing, smiles during exam CV: regular rate and rhythm, no murmurs appreciated  RESP: no increased work of breathing, mild crackles right lower lung, no wheezes, no rhonchi  ABD: Bowel sounds present. Soft, RUQ tenderness (underneath right breast) appreciated, Nondistended. No palpable masses  MSK: no lower extremity edema or calf tenderness, right flank tenderness  SKIN: warm, dry NEURO: grossly normal, moves all extremities appropriately PSYCH: Normal affect, appropriate speech and behavior    ASSESSMENT/PLAN:   Lung mass ED note, labs and imaging reviewed from ED encounter on 04/24/20. CT ABD pelvis noted a RLL mass which was followed up with a CT chest. On CT chest, patient had 2 x4 cm RLL subpleural consolidation concerning for mass and a 6 mm ground glass nodularity in the LUL which likely  represented atypical pneumonia.   Has had no recent PAP as she had a hysterectomy and oopherectomy when she was 54 yo 2/2 to tumor/mass. Colonoscopy and EGD performed in Jan 2023, results revied.  Mammogram completed in Sept 2020, results reviewed  - Referral placed to pulmonology  - Obtain CBC, CMP, BNP       Lyndee Hensen, DO PGY-2, [facility] 04/30/2020

## 2020-04-30 NOTE — Patient Instructions (Addendum)
It was great seeing you today!  Please check-out at the front desk before leaving the clinic. I'd like to see you back in for any new issues we're happy to fit you in, just give [place] a call!  Visit Remembers: - Stop by the pharmacy to pick up your inhaler  - Continue to work on your healthy eating habits and incorporating exercise into your daily life.  - A referral was placed to the [facility] Pulmonology office in [place] to better evaluate the mass in your lung  - Seek urgent medical care if you have increasing shortness of breath, chest pain or fever   Call us if:    Regarding lab work today:  Due to recent changes in healthcare laws, you may see the results of your imaging and laboratory studies on MyChart before your provider has had a chance to review them.  I understand that in some cases there may be results that are confusing or concerning to you. Not all laboratory results come back in the same time frame and you may be waiting for multiple results in order to interpret others.  Please give [place] 72 hours in order for your provider to thoroughly review all the results before contacting the office for clarification of your results. If everything is normal, you will get a letter in the mail or a message in My Chart. Please give [place] a call if you do not hear from [place] after 2 weeks.  Please bring all of your medications with you to each visit.    If you haven't already, sign up for My Chart to have easy access to your labs results, and communication with your primary care physician.  Feel free to call with any questions or concerns at any time, at [phone].   Take care,  Dr. Rushie [facility]

## 2020-05-01 ENCOUNTER — Encounter: Payer: Self-pay | Admitting: Family Medicine

## 2020-05-01 DIAGNOSIS — R918 Other nonspecific abnormal finding of lung field: Secondary | ICD-10-CM | POA: Insufficient documentation

## 2020-05-01 LAB — CBC WITH DIFFERENTIAL/PLATELET
Basophils Absolute: 0 10*3/uL (ref 0.0–0.2)
Basos: 1 %
EOS (ABSOLUTE): 0.3 10*3/uL (ref 0.0–0.4)
Eos: 4 %
Hematocrit: 38.7 % (ref 34.0–46.6)
Hemoglobin: 13 g/dL (ref 11.1–15.9)
Immature Grans (Abs): 0 10*3/uL (ref 0.0–0.1)
Immature Granulocytes: 0 %
Lymphocytes Absolute: 1.2 10*3/uL (ref 0.7–3.1)
Lymphs: 16 %
MCH: 30 pg (ref 26.6–33.0)
MCHC: 33.6 g/dL (ref 31.5–35.7)
MCV: 89 fL (ref 79–97)
Monocytes Absolute: 0.8 10*3/uL (ref 0.1–0.9)
Monocytes: 11 %
Neutrophils Absolute: 5.1 10*3/uL (ref 1.4–7.0)
Neutrophils: 68 %
Platelets: 448 10*3/uL (ref 150–450)
RBC: 4.33 x10E6/uL (ref 3.77–5.28)
RDW: 13.7 % (ref 11.7–15.4)
WBC: 7.5 10*3/uL (ref 3.4–10.8)

## 2020-05-01 LAB — COMPREHENSIVE METABOLIC PANEL
ALT: 13 IU/L (ref 0–32)
AST: 17 IU/L (ref 0–40)
Albumin/Globulin Ratio: 1.5 (ref 1.2–2.2)
Albumin: 4.3 g/dL (ref 3.8–4.9)
Alkaline Phosphatase: 185 IU/L — ABNORMAL HIGH (ref 48–121)
BUN/Creatinine Ratio: 14 (ref 9–23)
BUN: 11 mg/dL (ref 6–24)
Bilirubin Total: 0.2 mg/dL (ref 0.0–1.2)
CO2: 22 mmol/L (ref 20–29)
Calcium: 9.4 mg/dL (ref 8.7–10.2)
Chloride: 101 mmol/L (ref 96–106)
Creatinine, Ser: 0.78 mg/dL (ref 0.57–1.00)
GFR calc Af Amer: 98 mL/min/{1.73_m2} (ref >59)
GFR calc non Af Amer: 85 mL/min/{1.73_m2} (ref >59)
Globulin, Total: 2.9 g/dL (ref 1.5–4.5)
Glucose: 86 mg/dL (ref 65–99)
Potassium: 4.4 mmol/L (ref 3.5–5.2)
Sodium: 139 mmol/L (ref 134–144)
Total Protein: 7.2 g/dL (ref 6.0–8.5)

## 2020-05-01 LAB — BRAIN NATRIURETIC PEPTIDE: BNP: 11.2 pg/mL (ref 0.0–100.0)

## 2020-05-01 NOTE — Assessment & Plan Note (Addendum)
ED note, labs and imaging reviewed from ED encounter on 04/24/20. CT ABD pelvis noted a RLL mass which was followed up with a CT chest. On CT chest, patient had 2 x4 cm RLL subpleural consolidation concerning for mass and a 6 mm ground glass nodularity in the LUL which likely represented atypical pneumonia.   Has had no recent PAP as she had a hysterectomy and oopherectomy when she was 54 yo 2/2 to tumor/mass. Colonoscopy and EGD performed in Jan 2023, results revied.  Mammogram completed in Sept 2020, results reviewed  - Referral placed to pulmonology  - Obtain CBC, CMP, BNP

## 2020-05-02 ENCOUNTER — Ambulatory Visit: Payer: Medicare HMO | Admitting: Orthopaedic Surgery

## 2020-05-07 ENCOUNTER — Encounter: Payer: Self-pay | Admitting: Family Medicine

## 2020-05-14 ENCOUNTER — Institutional Professional Consult (permissible substitution): Payer: Self-pay | Admitting: Neurology

## 2020-05-15 ENCOUNTER — Encounter: Payer: Self-pay | Admitting: Family Medicine

## 2020-05-15 MED ORDER — GUAIFENESIN-CODEINE 100-10 MG/5ML PO SOLN: 5.0000 mL | ORAL | 0 refills | Status: DC | PRN

## 2020-05-15 NOTE — Telephone Encounter (Signed)
Patient LVM on nurse line following up on mychart message. In VM patient states that she is not able to see the pulmonologist before 10/5. Patient is requesting cough medicine, guaifenesin-codeine 100-10 mg, prescribed last by Dr. Kris Mouton.   To Dr. Susa Simmonds.   Talbot Grumbling, RN

## 2020-05-23 ENCOUNTER — Institutional Professional Consult (permissible substitution): Payer: Self-pay | Admitting: Neurology

## 2020-05-23 ENCOUNTER — Encounter: Payer: Self-pay | Admitting: Neurology

## 2020-05-24 ENCOUNTER — Other Ambulatory Visit: Payer: Self-pay | Admitting: Family Medicine

## 2020-05-31 ENCOUNTER — Other Ambulatory Visit: Payer: Self-pay | Admitting: *Deleted

## 2020-05-31 NOTE — Telephone Encounter (Signed)
Patient made initial request for rx on 05/24/20. Patient calls nurse line due to only having a few pills left. Please advise if refill can be sent in before the weekend.   To PCP and precepting provider.   Talbot Grumbling, RN

## 2020-06-02 MED ORDER — GABAPENTIN 800 MG PO TABS
800.0000 mg | ORAL_TABLET | Freq: Three times a day (TID) | ORAL | 0 refills | Status: DC
Start: 2020-06-02 — End: 2020-06-04

## 2020-06-03 ENCOUNTER — Other Ambulatory Visit: Payer: Self-pay | Admitting: *Deleted

## 2020-06-03 ENCOUNTER — Ambulatory Visit: Payer: Self-pay | Admitting: Neurology

## 2020-06-04 ENCOUNTER — Encounter: Payer: Self-pay | Admitting: Pulmonary Disease

## 2020-06-04 ENCOUNTER — Ambulatory Visit (INDEPENDENT_AMBULATORY_CARE_PROVIDER_SITE_OTHER): Payer: Medicare HMO | Admitting: Pulmonary Disease

## 2020-06-04 ENCOUNTER — Other Ambulatory Visit: Payer: Self-pay

## 2020-06-04 DIAGNOSIS — J849 Interstitial pulmonary disease, unspecified: Secondary | ICD-10-CM

## 2020-06-04 DIAGNOSIS — R918 Other nonspecific abnormal finding of lung field: Secondary | ICD-10-CM | POA: Diagnosis not present

## 2020-06-04 DIAGNOSIS — Z23 Encounter for immunization: Secondary | ICD-10-CM | POA: Diagnosis not present

## 2020-06-04 DIAGNOSIS — J479 Bronchiectasis, uncomplicated: Secondary | ICD-10-CM

## 2020-06-04 NOTE — Assessment & Plan Note (Signed)
I personally reviewed CT, in my opinion this is a faint infiltrate in the left possibly related to atelectasis rather than true consolidation.  She did not have leukocytosis.  At any rate she was treated with antibiotics .  Of note the lung bases appeared clear on CT abdomen from 09/2019 so not worried about malignancy here. We will repeat a CT scan as a short-term follow-up 2 to 3 months
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
## 2020-06-04 NOTE — Progress Notes (Signed)
Subjective:    Patient ID: Laura Burnett, female    DOB: 02-Aug-1966, 54 y.o.   MRN: [id number]  HPI  54 year old non-smoker presents for evaluation of abnormal CT imaging showing right lower lobe infiltrate.  PMH -asthma, hypothyroid, PTSD, anxiety, gastritis, fibromyalgia L5/S1 spinal fusion She developed sudden onset stabbing right hypochondrial pain radiating to her back and went to the emergency room at [facility] on 8/25, labs showed normal CBC, slight elevated alk phos and lipase of 82.  CT abdomen picked up a masslike density in the right lower lobe and CT chest was obtained with contrast which I independently reviewed, showed 2 x 4 cm subpleural consolidation with air bronchogram.  Also picked up a 6 mm groundglass nodule in the left upper lobe. She was treated with doxycycline and discharged and had a subsequent follow-up with her PCP.  Since then abdominal pain is recurred once and subsided spontaneously  Of note CT abdomen 09/2019 showed clear lung bases.  She reports recurrent bronchitis for last few years.  She reports a chronic cough ongoing for at least 2 years.  She reports a prior history of neck swelling and CT neck 10/2018 has demonstrated only thyromegaly.  She has also had ENT evaluation for this  She smoked less than 5 pack years before she quit at age 54  She has a family history of colon cancer in her dad, stomach cancer in one brother   Significant tests/ events reviewed CT chest W con 8/26 >> 2 x 4 cm right lower lobe subpleural consolidation . A 6 mm ground-glass nodularity in the left upper lobe   CT abdomen 09/2019 clear lungs  Past Medical History:  Diagnosis Date  . Allergy    seasonal allergies/animals  . Anxiety   . Arthritis   . Asthma    as a child " exercise induced"  . Bronchitis   . Constipation   . Depression   . DJD (degenerative joint disease)   . Fibromyalgia   . GERD (gastroesophageal reflux disease)   . Headache   .  Hypothyroidism   . Obesity   . PONV (postoperative nausea and vomiting)   . Wears glasses     Past Surgical History:  Procedure Laterality Date  . ABDOMINAL HYSTERECTOMY    . ADENOIDECTOMY    . APPLICATION OF ROBOTIC ASSISTANCE FOR SPINAL PROCEDURE N/A 12/21/2016   Procedure: APPLICATION OF ROBOTIC ASSISTANCE FOR SPINAL PROCEDURE;  Surgeon: Kevan Ny Ditty, MD;  Location: [place];  Service: Neurosurgery;  Laterality: N/A;  . BACK SURGERY     L5-S1  . CARPAL TUNNEL RELEASE Right 01/16/2019   Procedure: RIGHT CARPAL TUNNEL RELEASE;  Surgeon: [name], MD;  Location: [place];  Service: Orthopedics;  Laterality: Right;  . COLONOSCOPY    . CYST EXCISION     uterine ;subsequent sx followed  . FOOT SURGERY    . HAND SURGERY     from AA  . KNEE ARTHROSCOPY     x2  . TONSILLECTOMY    . TRIGGER FINGER RELEASE Right 01/16/2019   Procedure: RIGHT TRIGGER THUMB RELEASE;  Surgeon: [name], MD;  Location: [employer] [facility];  Service: Orthopedics;  Laterality: Right;  . TRIGGER FINGER RELEASE Right 12/04/2019   Procedure: right index trigger finger release;  Surgeon: [name], MD;  Location: [place];  Service: Orthopedics;  Laterality: Right;  . UPPER GASTROINTESTINAL ENDOSCOPY      Allergies  Allergen Reactions  . Ciprofloxacin Nausea And Vomiting  . Phentermine Other (See Comments)    Tearing, lightheadedness, depression  . Tape Other (See Comments)    " I get red and itchy." Paper tape okay  . Percocet [Oxycodone-Acetaminophen] Hives and Rash  . Tetanus Toxoids Nausea And Vomiting  . Tylenol With Codeine #3 [Acetaminophen-Codeine] Rash     Social History   Socioeconomic History  . Marital status: Single    Spouse name: Not on file  . Number of children: Not on file  . Years of education: Not on file  . Highest education level: Not on file  Occupational History  . Not on file  Tobacco Use  . Smoking status: Former  Research scientist (life sciences)  . Smokeless tobacco: Never Used  . Tobacco comment: stopped smoking cigarettes at age 54  Vaping Use  . Vaping Use: Never used  Substance and Sexual Activity  . Alcohol use: Yes    Comment: occasional beer  . Drug use: No    Comment:  per pt when she was 16 year  . Sexual activity: Not Currently  Other Topics Concern  . Not on file  Social History Narrative  . Not on file   Social Determinants of Health   Financial Resource Strain:   . Difficulty of Paying Living Expenses: Not on file  Food Insecurity:   . Worried About Charity fundraiser in the Last Year: Not on file  . Ran Out of Food in the Last Year: Not on file  Transportation Needs:   . Lack of Transportation (Medical): Not on file  . Lack of Transportation (Non-Medical): Not on file  Physical Activity:   . Days of Exercise per Week: Not on file  . Minutes of Exercise per Session: Not on file  Stress:   . Feeling of Stress : Not on file  Social Connections:   . Frequency of Communication with Friends and Family: Not on file  . Frequency of Social Gatherings with Friends and Family: Not on file  . Attends Religious Services: Not on file  . Active Member of Clubs or Organizations: Not on file  . Attends Archivist Meetings: Not on file  . Marital Status: Not on file  Intimate Partner Violence:   . Fear of Current or Ex-Partner: Not on file  . Emotionally Abused: Not on file  . Physically Abused: Not on file  . Sexually Abused: Not on file    Family History  Problem Relation Age of Onset  . Thyroid disease Sister   . Thyroid disease Brother   . Stomach cancer Brother   . Alzheimer's disease Mother   . Diabetes Mother   . Heart attack Father   . Colon cancer Father   . Heart failure Father   . Cancer Brother   . Cancer Brother   . Esophageal cancer Neg Hx   . Rectal cancer Neg Hx     Review of Systems  Shortness of breath with activity Cough, dry Chest pain when wearing bra Acid  heartburn or indigestion Weight fluctuation Upper abdominal pain Difficulty swallowing Nasal congestion Itching Anxiety and depression Joint stiffness    Objective:   Physical Exam  Gen. Pleasant, obese, in no distress, normal affect ENT - no pallor,icterus, no post nasal drip, class 2-3 airway Neck: No JVD, no thyromegaly, no carotid bruits Lungs: no use of accessory muscles, no dullness to percussion, bibasal dry rales no rhonchi  Cardiovascular: Rhythm regular, heart sounds  normal,  no murmurs or gallops, no peripheral edema Abdomen: soft and non-tender, no hepatosplenomegaly, BS normal. Musculoskeletal: No deformities, no cyanosis or clubbing Neuro:  alert, non focal, no tremors       Assessment & Plan:

## 2020-06-04 NOTE — Patient Instructions (Signed)
  Schedule high-resolution CT scan of the chest at [facility] in last week of October to clarify question about mass and crackles in the lung Based on this, we will decide next steps

## 2020-06-04 NOTE — Assessment & Plan Note (Signed)
She has dry bibasal crackles.  Her prior CT does not show any evidence of ILD.  I would be concerned about bronchiectasis given her history of recurrent bronchitis and chronic cough. We will obtain high-resolution CT chest to clarify

## 2020-06-07 ENCOUNTER — Encounter (HOSPITAL_COMMUNITY): Payer: Self-pay | Admitting: Psychiatry

## 2020-06-07 ENCOUNTER — Telehealth (INDEPENDENT_AMBULATORY_CARE_PROVIDER_SITE_OTHER): Payer: Medicare HMO | Admitting: Psychiatry

## 2020-06-07 ENCOUNTER — Other Ambulatory Visit: Payer: Self-pay

## 2020-06-07 DIAGNOSIS — F411 Generalized anxiety disorder: Secondary | ICD-10-CM | POA: Diagnosis not present

## 2020-06-07 DIAGNOSIS — F330 Major depressive disorder, recurrent, mild: Secondary | ICD-10-CM

## 2020-06-07 MED ORDER — NORTRIPTYLINE HCL 50 MG PO CAPS
50.0000 mg | ORAL_CAPSULE | Freq: Every day | ORAL | 0 refills | Status: DC
Start: 2020-07-10 — End: 2020-09-19

## 2020-06-07 MED ORDER — HYDROXYZINE HCL 25 MG PO TABS: 25.0000 mg | ORAL_TABLET | Freq: Every day | ORAL | 2 refills | Status: DC | PRN

## 2020-06-07 MED ORDER — DULOXETINE HCL 60 MG PO CPEP
120.0000 mg | ORAL_CAPSULE | Freq: Every day | ORAL | 0 refills | Status: DC
Start: 2020-07-22 — End: 2020-09-19

## 2020-06-07 MED ORDER — DIAZEPAM 2 MG PO TABS: 2.0000 mg | ORAL_TABLET | Freq: Every day | ORAL | 1 refills | Status: DC | PRN

## 2020-06-07 NOTE — Patient Instructions (Signed)
1. Continue duloxetine 120 mg daily 2.Continuenortriptyline 50mg  at night  3. Start hydoryzine 25 mg daily as needed for anxiety  3.Continuevalium 2 mg dailyas needed for anxiety 4.Next appointment:  12/17 at 10 AM

## 2020-06-11 ENCOUNTER — Telehealth: Payer: Self-pay | Admitting: Pulmonary Disease

## 2020-06-11 NOTE — Telephone Encounter (Signed)
Spoke with the pt and notified of response per Dr Elsworth Soho  She verbalized understanding and nothing further needed

## 2020-06-11 NOTE — Telephone Encounter (Signed)
Primary Pulmonologist: Elsworth Soho Last office visit and with whom: 06/04/20 with Elsworth Soho What do we see them for (pulmonary problems): interstitial pulmonary disease Last OV assessment/plan:  Assessment & Plan:      Assessment & Plan Note by Rigoberto Noel, MD at 06/04/2020 4:48 PM Author: Rigoberto Noel, MD Author Type: Physician Filed: 06/04/2020 4:48 PM  Note Status: Written Cosign: Cosign Not Required Encounter Date: 06/04/2020  Problem: Bronchiectasis without complication [facility])  Editor: Rigoberto Noel, MD (Physician)                 She has dry bibasal crackles.  Her prior CT does not show any evidence of ILD.  I would be concerned about bronchiectasis given her history of recurrent bronchitis and chronic cough. We will obtain high-resolution CT chest to clarify    Assessment & Plan Note by Rigoberto Noel, MD at 06/04/2020 4:47 PM Author: Rigoberto Noel, MD Author Type: Physician Filed: 06/04/2020 4:47 PM  Note Status: Written Cosign: Cosign Not Required Encounter Date: 06/04/2020  Problem: Lung mass  Editor: Rigoberto Noel, MD (Physician)                 I personally reviewed CT, in my opinion this is a faint infiltrate in the left possibly related to atelectasis rather than true consolidation.  She did not have leukocytosis.  At any rate she was treated with antibiotics .  Of note the lung bases appeared clear on CT abdomen from 09/2019 so not worried about malignancy here. We will repeat a CT scan as a short-term follow-up 2 to 3 months    Patient Instructions by Rigoberto Noel, MD at 06/04/2020 2:30 PM Author: Rigoberto Noel, MD Author Type: Physician Filed: 06/04/2020 3:22 PM  Note Status: Signed Cosign: Cosign Not Required Encounter Date: 06/04/2020  Editor: Rigoberto Noel, MD (Physician)                  Schedule high-resolution CT scan of the chest at [facility] in last week of October to clarify question about mass and crackles in the lung Based on this, we will decide next  steps      Was appointment offered to patient (explain)?  Laura Burnett wants recommendations   Reason for call: Called and spoke with Laura Burnett who stated she developed upper back pain which is at the middle region of her back. Laura Burnett stated that she has been using the heating pad which helps until she moves. Asked Laura Burnett if she feels like she pulled a muscle and she stated not that she knows of. Laura Burnett said that she does take a flexeril tid for her fibromyalgia.  Asked Laura Burnett if she has pain when she takes in a deep breath and she said that it does not hurt but she does feel more congested. Laura Burnett said when she coughs, she does have pain and also states that her throat is irritated from coughing. Laura Burnett is occ coughing up creamy white phlegm. Laura Burnett denies any complaints of fever as temp this morning was 97.6.  Laura Burnett also stated she has tried taking ibuprofen to see if it would help with the pain and she said that it did help some. Laura Burnett also took an extra strength tylenol. Laura Burnett has had to use her albuterol inhaler at least 1-2 times daily to see if it would help with her cough.  Laura Burnett wants to know what could be recommended to help with her symptoms. Dr. Elsworth Soho, please advise.  Allergies  Allergen Reactions  . Ciprofloxacin Nausea And Vomiting  . Phentermine Other (See Comments)    Tearing, lightheadedness, depression  . Tape Other (See Comments)    " I get red and itchy." Paper tape okay  . Percocet [Oxycodone-Acetaminophen] Hives and Rash  . Tetanus Toxoids Nausea And Vomiting  . Tylenol With Codeine #3 [Acetaminophen-Codeine] Rash    Immunization History  Administered Date(s) Administered  . Influenza,inj,Quad PF,6+ Mos 07/06/2018, 05/09/2019, 06/04/2020  . Moderna SARS-COVID-2 Vaccination 11/23/2019, 12/21/2019

## 2020-06-11 NOTE — Telephone Encounter (Signed)
Continue tylenol prn pain If persists beyond 3 days , OV with PCP or with our APP

## 2020-06-27 ENCOUNTER — Other Ambulatory Visit: Payer: Self-pay

## 2020-06-27 ENCOUNTER — Ambulatory Visit (HOSPITAL_COMMUNITY)
Admission: RE | Admit: 2020-06-27 | Discharge: 2020-06-27 | Disposition: A | Discharge status: Home / Self Care | Payer: Medicare HMO | Source: Ambulatory Visit | Attending: Pulmonary Disease | Admitting: Pulmonary Disease

## 2020-06-27 DIAGNOSIS — J849 Interstitial pulmonary disease, unspecified: Secondary | ICD-10-CM | POA: Diagnosis not present

## 2020-06-28 ENCOUNTER — Encounter: Payer: Self-pay | Admitting: Family Medicine

## 2020-06-28 MED ORDER — HYDROXYZINE HCL 25 MG PO TABS
25.0000 mg | ORAL_TABLET | Freq: Two times a day (BID) | ORAL | 3 refills | Status: DC | PRN
Start: 2020-06-28 — End: 2020-07-16

## 2020-07-02 ENCOUNTER — Ambulatory Visit (INDEPENDENT_AMBULATORY_CARE_PROVIDER_SITE_OTHER): Payer: Medicare HMO | Admitting: Pulmonary Disease

## 2020-07-02 ENCOUNTER — Telehealth: Payer: Self-pay | Admitting: Pulmonary Disease

## 2020-07-02 ENCOUNTER — Other Ambulatory Visit: Payer: Self-pay

## 2020-07-02 ENCOUNTER — Encounter: Payer: Self-pay | Admitting: Pulmonary Disease

## 2020-07-02 VITALS — BP 128/78 | HR 103 | Temp 97.4°F | Ht 61.0 in | Wt 198.2 lb

## 2020-07-02 DIAGNOSIS — J849 Interstitial pulmonary disease, unspecified: Secondary | ICD-10-CM | POA: Diagnosis not present

## 2020-07-02 DIAGNOSIS — G4733 Obstructive sleep apnea (adult) (pediatric): Secondary | ICD-10-CM | POA: Insufficient documentation

## 2020-07-02 DIAGNOSIS — G4710 Hypersomnia, unspecified: Secondary | ICD-10-CM | POA: Diagnosis not present

## 2020-07-02 DIAGNOSIS — J479 Bronchiectasis, uncomplicated: Secondary | ICD-10-CM

## 2020-07-02 MED ORDER — SODIUM CHLORIDE 3 % IN NEBU: INHALATION_SOLUTION | Freq: Every day | RESPIRATORY_TRACT | 1 refills | Status: DC

## 2020-07-02 NOTE — Assessment & Plan Note (Signed)
Given excessive daytime somnolence, narrow pharyngeal exam & loud snoring, obstructive sleep apnea is very likely & an overnight polysomnogram will be scheduled as a home study. The pathophysiology of obstructive sleep apnea , it's cardiovascular consequences & modes of treatment including CPAP were discused with the patient in detail & they evidenced understanding.  

## 2020-07-02 NOTE — Patient Instructions (Signed)
Schedule PFTs at [facility]. Schedule home sleep test  Prescription for nebulizer Hypertonic saline nebs once daily for airway clearance # 30 with 1 refill

## 2020-07-02 NOTE — Assessment & Plan Note (Signed)
There is no evidence of recurrent ILD on HRCT.  There is bibasal mild scarring which is likely postinflammatory/infectious.  She does have bibasilar bronchiectasis which may explain difficult to resolve bronchitis. We will provide her with a nebulizer and hypertonic saline nebs to use whenever she has productive phlegm We will also obtain PFTs to see if she will benefit from bronchodilators.  Meanwhile she can continue to use albuterol on an as-needed basis.  We had some discussion about how to differentiate dyspnea arising from a lung condition versus anxiety

## 2020-07-02 NOTE — Progress Notes (Signed)
   Subjective:    Patient ID: [name], female    DOB: Jul 21, 1966, 54 y.o.   MRN: [id number]  HPI  54 year old non-smoker for follow-up of dyspnea and right lower lobe infiltrate with suspicion for ILD  PMH -asthma, hypothyroid, PTSD, anxiety, gastritis, fibromyalgia L5/S1 spinal fusion She had an ED visit 03/2020 for right hypochondriac pain and CT chest showed right lower lobe subpleural consolidation On initial office visit, she was noted to have bibasal crackles Of note CT abdomen 09/2019 showed clear lung bases.  Chief Complaint  Patient presents with  . Follow-up    Pt states she has been doing okay since last visit. States she is more SOB and feels like it is happening all the time now.    States that breathing is still bad, cough has improved and is nonproductive We discussed high-resolution CT chest today which did not show ILD which showed evidence of mild bronchiectasis She has significant anxiety, wonders how to differentiate anxiety from lung problems She reports excessive tiredness, psychiatrist has advised sleep study  Epworth sleepiness score is 13 and she reports sleepiness while watching TV, lying down to rest in the afternoons or sitting quietly after lunch. She finds it hard to fall asleep and wakes up early sometimes she falls asleep on the couch. Bedtime is between 930 and 11 PM, sleep latency is 30 to 45 minutes, 1-2 awakenings, wake up time is variable between 5 and 10 AM.  She has gained 10 pounds in the last 2 years  Significant tests/ events reviewed  HRCT 05/2020 >> resolved RLL infx, mild, dependent bibasilar scarring and ground-glass, left greater than right -Mild, tubular bronchiectasis  CT chest W con 8/26 >> 2 x 4 cm right lower lobe subpleural consolidation . A 6 mm ground-glass nodularity in the left upper lobe   CT abdomen 09/2019 clear lungs   Review of Systems  neg for any significant sore throat, dysphagia, itching, sneezing, nasal  congestion or excess/ purulent secretions, fever, chills, sweats, unintended wt loss, pleuritic or exertional cp, hempoptysis, orthopnea pnd or change in chronic leg swelling. Also denies presyncope, palpitations, heartburn, abdominal pain, nausea, vomiting, diarrhea or change in bowel or urinary habits, dysuria,hematuria, rash, arthralgias, visual complaints, headache, numbness weakness or ataxia.     Objective:   Physical Exam   Gen. Pleasant, obese, in no distress ENT - no lesions, no post nasal drip Neck: No JVD, no thyromegaly, no carotid bruits Lungs: no use of accessory muscles, no dullness to percussion, bibasal dry rales no rhonchi  Cardiovascular: Rhythm regular, heart sounds  normal, no murmurs or gallops, no peripheral edema Musculoskeletal: No deformities, no cyanosis or clubbing , no tremors        Assessment & Plan:

## 2020-07-04 ENCOUNTER — Institutional Professional Consult (permissible substitution): Payer: Medicare HMO | Admitting: Neurology

## 2020-07-04 NOTE — Telephone Encounter (Signed)
Spoke to pt & made her Resp Therapy will call her for pft at [facility].  Gave her phone # for Resp Therapy.  Nothing further needed.

## 2020-07-16 ENCOUNTER — Other Ambulatory Visit: Payer: Self-pay

## 2020-07-16 ENCOUNTER — Ambulatory Visit (INDEPENDENT_AMBULATORY_CARE_PROVIDER_SITE_OTHER): Payer: Medicare HMO | Admitting: Family Medicine

## 2020-07-16 ENCOUNTER — Encounter: Payer: Self-pay | Admitting: Family Medicine

## 2020-07-16 ENCOUNTER — Other Ambulatory Visit (HOSPITAL_COMMUNITY)
Admission: RE | Admit: 2020-07-16 | Discharge: 2020-07-16 | Disposition: A | Discharge status: Home / Self Care | Payer: Medicare HMO | Source: Ambulatory Visit | Attending: Pulmonary Disease | Admitting: Pulmonary Disease

## 2020-07-16 VITALS — BP 122/80 | HR 99 | Ht 61.0 in | Wt 201.1 lb

## 2020-07-16 DIAGNOSIS — Z20822 Contact with and (suspected) exposure to covid-19: Secondary | ICD-10-CM | POA: Insufficient documentation

## 2020-07-16 DIAGNOSIS — E782 Mixed hyperlipidemia: Secondary | ICD-10-CM

## 2020-07-16 DIAGNOSIS — I2510 Atherosclerotic heart disease of native coronary artery without angina pectoris: Secondary | ICD-10-CM | POA: Diagnosis not present

## 2020-07-16 DIAGNOSIS — A630 Anogenital (venereal) warts: Secondary | ICD-10-CM | POA: Diagnosis not present

## 2020-07-16 DIAGNOSIS — Z01812 Encounter for preprocedural laboratory examination: Secondary | ICD-10-CM | POA: Insufficient documentation

## 2020-07-16 DIAGNOSIS — B079 Viral wart, unspecified: Secondary | ICD-10-CM

## 2020-07-16 NOTE — Patient Instructions (Signed)
It was great seeing you today!   I'd like to see you back as needed for another round of freezing but if you need to be seen earlier than that for any new issues we're happy to fit you in, just give [place] a call!  Follow up with your lung doctor as scheduled.   Regarding the finding of coronary artery disease on your recent CT. We ordered a lipid panel to check your cholesterol. If your LDL is still elevated we may consider increasing your cholesterol medication.    If you have questions or concerns please do not hesitate to call at [phone].  Dr. Rushie [facility]

## 2020-07-16 NOTE — Assessment & Plan Note (Addendum)
Hypopigmented area.  Underwent another round of cryotherapy at today's visit.  Cryotherapy tolerated well.Previously used imiquimod therapy.  Can follow-up in 4 weeks if lesion still persistent and can undergo another round of cryotherapy, otherwise follow-up as needed.

## 2020-07-16 NOTE — Progress Notes (Addendum)
SUBJECTIVE:   CHIEF COMPLAINT / HPI:  Follow-up CT Chest: Laura Burnett made an appointment today to discuss some concerns she had following her High-Res CT Chest. She saw the radiologist's note regarding the finding of coronary artery disease in her left coronary artery. This concerned her given her family history of stroke in her mother and CAD in her father. She is wondering whether intervention is warranted at this time. She is not diabetic and does not have a history of hypertension. She does have a history of hyperlipidemia (last lipid panel in 03/19) and has been taking atorvastatin 10mg  daily with no issue. Laura Burnett or chest tightness. She does have some shortness of breath and exercise intolerance, but attributes this to her bronchiectasis for which she sees pulmonology.   Verruca: She has a longstanding genital wart that she would like to have treated with cryotherapy. Dr. Kris Mouton treated this with two rounds of cryotherapy earlier in the year with incomplete resolution. She also tried imiquimod treatment with no improvement.   PERTINENT  PMH / PSH: Anxiety; Verrucous wart of the perineum  OBJECTIVE:   BP 122/80   Pulse 99   Ht 5\' 1"  (1.549 m)   Wt 201 lb 2 oz (91.2 kg)   SpO2 97%   BMI 38.00 kg/m   Physical Exam Vitals reviewed.  Constitutional:      General: She is not in acute distress. Cardiovascular:     Rate and Rhythm: Normal rate and regular rhythm.     Heart sounds: Normal heart sounds.  Pulmonary:     Effort: Pulmonary effort is normal.     Breath sounds: Normal breath sounds.  Abdominal:     General: Bowel sounds are normal. There is no distension.     Tenderness: There is no abdominal tenderness.  Genitourinary:    Comments: 4-5 mm hypopigmented lesion at the fourchette   PROCEDURE: Cryotherapy   PERFORMED BY: Dr. Lyndee Hensen       The area surrounding the skin lesion was prepared in the usual sterile manner. A test freeze was performed ensuring  coverage of entire area as above.  The cryotherapy gun was then applied for 3 seconds until an ice ball formed with a 5-7 mm border.  This was allowed to thaw and then the cryotherapy was again applied for 3 seconds to an ice ball of 5-7 mm.     The patient tolerated the procedure well.  Return precautions provided.  Return to the office if area on vaginal fourchette does not regress.    ASSESSMENT/PLAN:   Verruca Hypopigmented area.  Underwent another round of cryotherapy at today's visit.  Cryotherapy tolerated well.Previously used imiquimod therapy.  Can follow-up in 4 weeks if lesion still persistent and can undergo another round of cryotherapy, otherwise follow-up as needed.   CAD (coronary artery disease) Calcification of LCA noted on chest CT 06/28/20. Asymptomatic. ASCVD 10-year risk 2.4%. LDL 123 in 10/2017. - Offered reassurance - Check lipids today - Consider increasing atorvastatin pending LDL      Laura Burnett, [place] OF STUDENT NOTE   I personally evaluated this patient along with the student, and verified all aspects of the history, physical exam, and medical decision making as documented by the student. I agree with the student's documentation and have made all necessary edits.   Lyndee Hensen, DO PGY-1, [facility] 07/16/2020 7:09 PM

## 2020-07-16 NOTE — Assessment & Plan Note (Addendum)
Calcification of LCA noted on chest CT 06/28/20. Asymptomatic. ASCVD 10-year risk 2.4%. LDL 123 in 10/2017. - Offered reassurance - Check lipids today - Consider increasing atorvastatin pending LDL

## 2020-07-17 ENCOUNTER — Encounter: Payer: Self-pay | Admitting: Family Medicine

## 2020-07-17 LAB — LIPID PANEL
Chol/HDL Ratio: 4 ratio (ref 0.0–4.4)
Cholesterol, Total: 215 mg/dL — ABNORMAL HIGH (ref 100–199)
HDL: 54 mg/dL (ref >39)
LDL Chol Calc (NIH): 119 mg/dL — ABNORMAL HIGH (ref 0–99)
Triglycerides: 239 mg/dL — ABNORMAL HIGH (ref 0–149)
VLDL Cholesterol Cal: 42 mg/dL — ABNORMAL HIGH (ref 5–40)

## 2020-07-17 LAB — SARS CORONAVIRUS 2 (TAT 6-24 HRS): SARS Coronavirus 2: NEGATIVE

## 2020-07-17 MED ORDER — ATORVASTATIN CALCIUM 40 MG PO TABS: 40.0000 mg | ORAL_TABLET | Freq: Every day | ORAL | 3 refills | Status: DC

## 2020-07-17 NOTE — Addendum Note (Signed)
Addended by: Lyndee Hensen D on: 07/17/2020 10:10 AM   Modules accepted: Orders

## 2020-07-18 ENCOUNTER — Other Ambulatory Visit: Payer: Self-pay

## 2020-07-18 ENCOUNTER — Ambulatory Visit (HOSPITAL_COMMUNITY)
Admission: RE | Admit: 2020-07-18 | Discharge: 2020-07-18 | Disposition: A | Discharge status: Home / Self Care | Payer: Medicare HMO | Source: Ambulatory Visit | Attending: Pulmonary Disease | Admitting: Pulmonary Disease

## 2020-07-18 DIAGNOSIS — J479 Bronchiectasis, uncomplicated: Secondary | ICD-10-CM | POA: Diagnosis present

## 2020-07-18 DIAGNOSIS — J849 Interstitial pulmonary disease, unspecified: Secondary | ICD-10-CM | POA: Diagnosis not present

## 2020-07-18 LAB — PULMONARY FUNCTION TEST
DL/VA % pred: 101 %
DL/VA: 4.42 ml/min/mmHg/L
DLCO unc % pred: 84 %
DLCO unc: 15.69 ml/min/mmHg
FEF 25-75 Post: 3.43 L/sec
FEF 25-75 Pre: 3.04 L/sec
FEF2575-%Change-Post: 12 %
FEF2575-%Pred-Post: 146 %
FEF2575-%Pred-Pre: 129 %
FEV1-%Change-Post: 11 %
FEV1-%Pred-Post: 100 %
FEV1-%Pred-Pre: 90 %
FEV1-Post: 2.39 L
FEV1-Pre: 2.15 L
FEV1FVC-%Change-Post: 1 %
FEV1FVC-%Pred-Pre: 108 %
FEV6-%Change-Post: 9 %
FEV6-%Pred-Post: 92 %
FEV6-%Pred-Pre: 84 %
FEV6-Post: 2.74 L
FEV6-Pre: 2.5 L
FEV6FVC-%Change-Post: 0 %
FEV6FVC-%Pred-Post: 103 %
FEV6FVC-%Pred-Pre: 103 %
FVC-%Change-Post: 9 %
FVC-%Pred-Post: 89 %
FVC-%Pred-Pre: 82 %
FVC-Post: 2.74 L
FVC-Pre: 2.5 L
Post FEV1/FVC ratio: 87 %
Post FEV6/FVC ratio: 100 %
Pre FEV1/FVC ratio: 86 %
Pre FEV6/FVC Ratio: 100 %
RV % pred: 99 %
RV: 1.75 L
TLC % pred: 96 %
TLC: 4.45 L

## 2020-07-18 MED ORDER — ALBUTEROL SULFATE (2.5 MG/3ML) 0.083% IN NEBU
2.5000 mg | INHALATION_SOLUTION | Freq: Once | RESPIRATORY_TRACT | Status: AC
  Administered 2020-07-18: 2.5 mg via RESPIRATORY_TRACT

## 2020-07-18 NOTE — Progress Notes (Signed)
Called and went over PFT result per Dr Elsworth Soho. Patient is wondering if she should proceed with obtaining the nebulizer medication (which she states she cannot afford) or is there an inhaler you would recommend?

## 2020-07-18 NOTE — Progress Notes (Signed)
Called and relayed message per Dr Elsworth Soho. Patient agreeable with this and expressed full understanding. Nothing further needed at this time.

## 2020-07-30 ENCOUNTER — Telehealth: Payer: Self-pay | Admitting: Pulmonary Disease

## 2020-07-30 DIAGNOSIS — J479 Bronchiectasis, uncomplicated: Secondary | ICD-10-CM

## 2020-07-30 NOTE — Telephone Encounter (Signed)
07/30/2020  Patient last seen by Dr. Elsworth Soho in November/2021.  It was recommended that she obtain a sleep study.  There is no evidence of recurrent interstitial lung disease on high-resolution CT chest that was reviewed.  He did 1 obtain pulmonary function testing.  Has patient continue to use her hypertonic saline nebs?  If she having any productive cough with purulent sputum?  Does she have a flutter valve?  Wyn Quaker, FNP

## 2020-07-30 NOTE — Telephone Encounter (Signed)
Called and spoke with patient to let her know of Brian's recs. Sleep study is scheduled for 08/14/20. Patient completed PFT on 07/18/20 and was told by Dr. Elsworth Soho: No need for nebulizer. Use albuterol MDI 2 puffs every 6 hours as needed  Patient does not have a flutter valve. States that she is just trying to not get sick.  Aaron Edelman please advise on any other recommendations

## 2020-07-30 NOTE — Telephone Encounter (Signed)
Called and spoke with patient, advised on recommendations per Wyn Quaker NP.  She stated that she has not picked up the nebulizer medication yet and she does not have a nebulizer machine.  Advised I would send an order to [employer] for the nebulizer machine and she will receive a call from them regarding how she would like to receive the machine.  Set up for Televisit with Wyn Quaker NP for Thursday.  Nothing further needed.

## 2020-07-30 NOTE — Telephone Encounter (Signed)
Spoke with pt, states that she is having increased pain under R breast since yesterday.  Also has some dyspnea with exertion, stable prod cough that is unchanged from her baseline.  Pt denies any injury to painful site, fever, hemoptysis.  States she has had this pain come and go before in same place.    Pt has been taking tylenol/motrin and using a heating pad to help with discomfort.   Pharmacy: [facility] in [place].    Sending to APP since RA is not in office to address.  Aaron Edelman please advise.  Thanks!

## 2020-07-30 NOTE — Telephone Encounter (Signed)
Would recommend patient continue to use hypertonic saline nebs.  Can offer patient in person appointment or telephonic visit to further evaluate and discuss symptoms  Wyn Quaker, FNP

## 2020-08-01 ENCOUNTER — Encounter: Payer: Self-pay | Admitting: Pulmonary Disease

## 2020-08-01 ENCOUNTER — Ambulatory Visit (HOSPITAL_COMMUNITY)
Admission: RE | Admit: 2020-08-01 | Discharge: 2020-08-01 | Disposition: A | Discharge status: Home / Self Care | Payer: Medicare HMO | Source: Ambulatory Visit | Attending: Pulmonary Disease | Admitting: Pulmonary Disease

## 2020-08-01 ENCOUNTER — Ambulatory Visit: Payer: Medicare HMO

## 2020-08-01 ENCOUNTER — Other Ambulatory Visit: Payer: Self-pay

## 2020-08-01 ENCOUNTER — Ambulatory Visit (INDEPENDENT_AMBULATORY_CARE_PROVIDER_SITE_OTHER): Payer: Medicare HMO | Admitting: Pulmonary Disease

## 2020-08-01 DIAGNOSIS — R059 Cough, unspecified: Secondary | ICD-10-CM

## 2020-08-01 DIAGNOSIS — R0602 Shortness of breath: Secondary | ICD-10-CM | POA: Diagnosis not present

## 2020-08-01 DIAGNOSIS — J479 Bronchiectasis, uncomplicated: Secondary | ICD-10-CM

## 2020-08-01 NOTE — Assessment & Plan Note (Signed)
Plan: Chest x-ray today We will coordinate getting patient set up with flutter valve Would also recommend patient starting hypertonic saline nebs to see if this helps with symptoms clinically Patient should utilize hypertonic saline nebs prior to flutter valve use If patient does start to have a fever, productive cough with purulent mucus then should obtain baseline sputum testing

## 2020-08-01 NOTE — Assessment & Plan Note (Signed)
Cough is likely multifactorial.  Patient with known bronchiectasis.  Also slight acutely worsened symptoms and recent pneumonia.  Plan: We will obtain chest x-ray to further evaluate

## 2020-08-01 NOTE — Patient Instructions (Signed)
You were seen today by Lauraine Rinne, NP  for:   1. Bronchiectasis without complication ([place]) 2. Cough  - DG Chest 2 View; Future  Bronchiectasis: This is the medical term which indicates that you have damage, dilated airways making you more susceptible to respiratory infection. We may ask you to start to use a flutter valve 10 breaths twice a day or 4 to 5 breaths 4-5 times a day to help clear mucus out >>> I will ask the [facility] clinic if they have any for you  Let us know if you have cough with change in mucus color or fevers or chills.  At that point you would need an antibiotic. Maintain a healthy nutritious diet, eating whole foods Take your medications as prescribed   Proceed forward with obtaining sleep study as outlined  We recommend today:  Orders Placed This Encounter  Procedures  . DG Chest 2 View    Standing Status:   Future    Number of Occurrences:   1    Standing Expiration Date:   11/30/2020    Order Specific Question:   Reason for Exam (SYMPTOM  OR DIAGNOSIS REQUIRED)    Answer:   cough, R chest wall pain for 5-7d    Order Specific Question:   Preferred imaging location?    Answer:   [facility]    Order Specific Question:   Radiology Contrast Protocol - do NOT remove file path    Answer:   [email or web address]   Orders Placed This Encounter  Procedures  . DG Chest 2 View   No orders of the defined types were placed in this encounter.   Follow Up:    Return in about 2 months (around 10/02/2020), or if symptoms worsen or fail to improve, for Follow up with Dr. Elsworth Soho, [facility].   Notification of test results are managed in the following manner: If there are  any recommendations or changes to the  plan of care discussed in office today,  we will contact you and let you know what they are. If you do not hear from [place], then your results are normal and you can view them through your  MyChart account  , or a letter will be sent to you. Thank you again for trusting [place] with your care  - Thank you, [facility]    It is flu season:   >>> Best ways to protect herself from the flu: Receive the yearly flu vaccine, practice good hand hygiene washing with soap and also using hand sanitizer when available, eat a nutritious meals, get adequate rest, hydrate appropriately       Please contact the office if your symptoms worsen or you have concerns that you are not improving.   Thank you for choosing [facility] for your healthcare, and for allowing [place] to partner with you on your healthcare journey. I am thankful to be able to provide care to you today.   Wyn Quaker FNP-C   Bronchiectasis  Bronchiectasis is a condition in which the airways in the lungs (bronchi) are damaged and widened. The condition makes it hard for the lungs to get rid of mucus, and it causes mucus to gather in the bronchi. This condition often leads to lung infections, which can make the condition worse. What are the causes? You can be born with this condition or you can develop it later in life. Common causes of this condition include:  Cystic fibrosis.  Repeated  lung infections, such as pneumonia or tuberculosis.  An object or other blockage in the lungs.  Breathing in fluid, food, or other objects (aspiration).  A problem with the immune system and lung structure that is present at birth (congenital). Sometimes the cause is not known. What are the signs or symptoms? Common symptoms of this condition include:  A daily cough that brings up mucus and lasts for more than 3 weeks.  Lung infections that happen often.  Shortness of breath and wheezing.  Weakness and fatigue. How is this diagnosed? This condition is diagnosed with tests, such as:  Chest X-rays or CT scans. These are done to check for changes in the lungs.  Breathing tests. These are done to check how well your lungs are  working.  A test of a sample of your saliva (sputum culture). This test is done to check for infection.  Blood tests and other tests. These are done to check for related diseases or causes. How is this treated? Treatment for this condition depends on the severity of the illness and its cause. Treatment may include:  Medicines that loosen mucus so it can be coughed up (expectorants).  Medicines that relax the muscles of the bronchi (bronchodilators).  Antibiotic medicines to prevent or treat infection.  Physical therapy to help clear mucus from the lungs. Techniques may include: ? Postural drainage. This is when you sit or lie in certain positions so that mucus can drain by gravity. ? Chest percussion. This involves tapping the chest or back with a cupped hand. ? Chest vibration. For this therapy, a hand or special equipment vibrates your chest and back.  Surgery to remove the affected part of the lung. This may be done in severe cases. Follow these instructions at home: Medicines  Take over-the-counter and prescription medicines only as told by your health care provider.  If you were prescribed an antibiotic medicine, take it as told by your health care provider. Do not stop taking the antibiotic even if you start to feel better.  Avoid taking sedatives and antihistamines unless your health care provider tells you to take them. These medicines tend to thicken the mucus in the lungs. Managing symptoms  Perform breathing exercises or techniques to clear your lungs as told by your health care provider.  Consider using a cold steam vaporizer or humidifier in your room or home to help loosen secretions.  If you have a cough that gets worse at night, try sleeping in a semi-upright position. General instructions  Get plenty of rest.  Drink enough fluid to keep your urine clear or pale yellow.  Stay inside when pollution and ozone levels are high.  Stay up to date with vaccinations  and immunizations.  Avoid cigarette smoke and other lung irritants.  Do not use any products that contain nicotine or tobacco, such as cigarettes and e-cigarettes. If you need help quitting, ask your health care provider.  Keep all follow-up visits as told by your health care provider. This is important. Contact a health care provider if:  You cough up more sputum than before and the sputum is yellow or green in color.  You have a fever.  You cannot control your cough and are losing sleep. Get help right away if:  You cough up blood.  You have chest pain.  You have increasing shortness of breath.  You have pain that gets worse or is not controlled with medicines.  You have a fever and your symptoms suddenly get  worse. Summary  Bronchiectasis is a condition in which the airways in the lungs (bronchi) are damaged and widened. The condition makes it hard for the lungs to get rid of mucus, and it causes mucus to gather in the bronchi.  Treatment usually includes therapy to help clear mucus from the lungs.  Stay up to date with vaccinations and immunizations. This information is not intended to replace advice given to you by your health care provider. Make sure you discuss any questions you have with your health care provider. Document Revised: 07/30/2017 Document Reviewed: 09/21/2016 Elsevier Patient Education  2020 [employer].

## 2020-08-01 NOTE — Progress Notes (Signed)
Virtual Visit via Telephone Note  I connected with Laura Burnett on 08/01/20 at 11:30 AM EST by telephone and verified that I am speaking with the correct person using two identifiers.  Location: Patient: Home Provider: [facility] - [address], [place], [place], [place] [address]   I discussed the limitations, risks, security and privacy concerns of performing an evaluation and management service by telephone and the availability of in person appointments. I also discussed with the patient that there may be a patient responsible charge related to this service. The patient expressed understanding and agreed to proceed.  Patient consented to consult via telephone: Yes People present and their role in pt care: Pt     History of Present Illness:  54 year old female former smoker followed in our office for bronchiectasis  Past medical history: Anxiety, hoarseness, seasonal allergies, CAD, carpal tunnel Smoking history: Former smoker Maintenance: none Patient of Dr. Elsworth Soho  Chief complaint:    54 year old female former smoker established with Dr. Elsworth Soho in the [facility].  Last seen in November/2021.  Plan of care from that office visit was as follows: Obtain sleep study due to daytime somnolence, loud snoring and narrow pharyngeal exam, high-resolution CT chest was reviewed and did not show any interstitial lung disease but did show bibasilar bronchiectasis.  She was started on nebulizer as well as hypertonic saline nebs and pulmonary function testing was ordered.  Patient was encouraged to have follow-up in 3 months in the [facility] with Dr. Elsworth Soho  Patient contacted our office on 07/30/2020 reporting that she was having pain under her right breast yesterday, also some dyspnea on exertion.  She was encouraged to continue using her hypertonic saline nebs.  She was scheduled for a televisit to further evaluate.  Patient reports that she has not started hypertonic  saline nebs.  She reports that she was called and told that her pulmonary function testing was normal so she was instructed to not start taking any nebulized medication.  We will discuss and review this today  Patient reports that she is having waxing and waning right chest wall pain.  This was present for a few days prior to 07/30/2020.  It got slightly better and then has returned over the last 48 hours.  She does report that she had right chest wall pain when she had her pneumonia previously a few months ago.  This had recently cleared on last CT imaging that was reviewed last month by Dr. Elsworth Soho with the patient.  Patient does have bibasilar bronchiectasis.  She does not have a flutter valve at home.  She feels that she has a cough but she is not able to bring up any mucus.  Patient denies fevers, chills, discolored purulent mucus.  She does feel that she has had a slightly worsened fatigue with that right chest wall pain as well as with her cough.   Observations/Objective:  Social History   Tobacco Use  Smoking Status Former Smoker  Smokeless Tobacco Never Used  Tobacco Comment   stopped smoking cigarettes at age 12   Immunization History  Administered Date(s) Administered  . Influenza,inj,Quad PF,6+ Mos 07/06/2018, 05/09/2019, 06/04/2020  . Moderna SARS-COVID-2 Vaccination 11/23/2019, 12/21/2019, 07/08/2020      Assessment and Plan:  Bronchiectasis without complication [facility]) Plan: Chest x-ray today We will coordinate getting patient set up with flutter valve Would also recommend patient starting hypertonic saline nebs to see if this helps with symptoms clinically Patient should utilize  hypertonic saline nebs prior to flutter valve use If patient does start to have a fever, productive cough with purulent mucus then should obtain baseline sputum testing  Cough Cough is likely multifactorial.  Patient with known bronchiectasis.  Also slight acutely worsened symptoms and recent  pneumonia.  Plan: We will obtain chest x-ray to further evaluate    Follow Up Instructions:  Return in about 2 months (around 10/02/2020), or if symptoms worsen or fail to improve, for Follow up with Dr. Elsworth Soho, [facility].   I discussed the assessment and treatment plan with the patient. The patient was provided an opportunity to ask questions and all were answered. The patient agreed with the plan and demonstrated an understanding of the instructions.   The patient was advised to call back or seek an in-person evaluation if the symptoms worsen or if the condition fails to improve as anticipated.  I provided 22 minutes of non-face-to-face time during this encounter.   Lauraine Rinne, NP

## 2020-08-07 ENCOUNTER — Other Ambulatory Visit: Payer: Self-pay

## 2020-08-07 DIAGNOSIS — J479 Bronchiectasis, uncomplicated: Secondary | ICD-10-CM
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
## 2020-08-14 ENCOUNTER — Other Ambulatory Visit: Payer: Self-pay

## 2020-08-14 ENCOUNTER — Ambulatory Visit: Payer: Medicare HMO

## 2020-08-14 DIAGNOSIS — G4733 Obstructive sleep apnea (adult) (pediatric): Secondary | ICD-10-CM | POA: Diagnosis not present

## 2020-08-16 ENCOUNTER — Telehealth (HOSPITAL_COMMUNITY): Payer: Medicare HMO | Admitting: Psychiatry

## 2020-08-16 ENCOUNTER — Telehealth: Payer: Medicare HMO | Admitting: Psychiatry

## 2020-08-22 DIAGNOSIS — S338XXA Sprain of other parts of lumbar spine and pelvis, initial encounter: Secondary | ICD-10-CM | POA: Diagnosis not present

## 2020-08-22 DIAGNOSIS — S233XXA Sprain of ligaments of thoracic spine, initial encounter: Secondary | ICD-10-CM | POA: Diagnosis not present

## 2020-08-22 DIAGNOSIS — M9901 Segmental and somatic dysfunction of cervical region: Secondary | ICD-10-CM | POA: Diagnosis not present

## 2020-08-22 DIAGNOSIS — S134XXA Sprain of ligaments of cervical spine, initial encounter: Secondary | ICD-10-CM | POA: Diagnosis not present

## 2020-08-28 ENCOUNTER — Other Ambulatory Visit: Payer: Self-pay | Admitting: Family Medicine

## 2020-08-29 ENCOUNTER — Telehealth: Payer: Self-pay | Admitting: Pulmonary Disease

## 2020-08-29 NOTE — Telephone Encounter (Signed)
Spoke with patient. She was requesting the results of her HST. I advised her that it appears the results have not been scanned into her chart yet but I will check with RA to see if he has a printed copy. She verbalized understanding.   RA, please advise on her HST results. Thanks!

## 2020-09-03 DIAGNOSIS — G4733 Obstructive sleep apnea (adult) (pediatric): Secondary | ICD-10-CM | POA: Diagnosis not present

## 2020-09-03 NOTE — Telephone Encounter (Signed)
HST showed mod  OSA with AHI 27/ hr Suggest autoCPAP  5-15 cm, mask of choice OV with me/APP in 8-10 wks

## 2020-09-04 ENCOUNTER — Ambulatory Visit (INDEPENDENT_AMBULATORY_CARE_PROVIDER_SITE_OTHER): Payer: Medicare HMO | Admitting: Pulmonary Disease

## 2020-09-04 ENCOUNTER — Telehealth: Payer: Self-pay | Admitting: *Deleted

## 2020-09-04 ENCOUNTER — Encounter: Payer: Self-pay | Admitting: Pulmonary Disease

## 2020-09-04 DIAGNOSIS — G4733 Obstructive sleep apnea (adult) (pediatric): Secondary | ICD-10-CM

## 2020-09-04 DIAGNOSIS — J471 Bronchiectasis with (acute) exacerbation: Secondary | ICD-10-CM | POA: Diagnosis not present

## 2020-09-04 NOTE — Telephone Encounter (Signed)
Called and spoke with pt letting her know the results of the HST and that RA suggested [place] starting her on CPAP. After stating that to pt that recommendation was to start on CPAP, pt stated that she would rather not be placed on CPAP. Pt stated that her brother has the oral appliance that he uses for OSA and I stated to her usually the oral appliance is used if pts have mild OSA and that recommendation to help treat moderate OSA is the CPAP. Stated to pt that we could schedule a visit with RA so he could further discuss the results of the HST and she stated that would be good.  While speaking with pt, pt stated that she went to the dentist yesterday 09/03/20 and was placed on abx due to one of her teeth. Pt stated that she has had some congestion but woke up today 09/04/20 with the congestion now in her chest. Pt has had a cough with clear phlegm and states that she coughs all throughout the day. Pt states that she has been using her flutter valve and her inhaler every 6 hours. Pt denies any complaints of fever.  Pt has been scheduled for a televisit today 09/04/20 at 11:15 with RA. Nothing further needed.

## 2020-09-04 NOTE — Patient Instructions (Signed)
  Complete course of amoxicillin for acute bronchitis. Schedule CPAP titration study. Prescription for auto CPAP 5 to 12 cm with mask of choice will be sent to DME

## 2020-09-04 NOTE — Progress Notes (Signed)
   Subjective:    Patient ID: Laura Burnett, female    DOB: 06-08-1966, 55 y.o.   MRN: [id number]  HPI  I connected with  Laura Burnett on 09/04/20 by phone and verified that I am speaking with the correct person using two identifiers.     Location: Patient: Home Provider: Office - [facility] - [address] [place], [address], [place], [place] [address]   I discussed the limitations of evaluation and management by telemedicine and the availability of in person appointments. The patient expressed understanding and agreed to proceed. I also discussed with the patient that there may be a patient responsible charge related to this service. The patient expressed understanding and agreed to proceed.   Patient consented to consult via telephone: Yes People present and their role in pt care: Pt   55 year old non-smoker for follow-up of dyspnea and right lower lobe infiltrate with suspicion for ILD  PMH -asthma, hypothyroid, PTSD, anxiety, gastritis, fibromyalgia L5/S1 spinal fusion She had an ED visit 03/2020 for right hypochondriac pain and CT chest showed right lower lobe subpleural consolidation On initial office visit, she was noted to have bibasal crackles Of note CT abdomen 09/2019 showed clear lung bases   Chief Complaint  Patient presents with  . Follow-up    Dentist yesterday, inflammed sinuses.  Put on ammoxicilliin 500mg  4 times/day and Ibuprofen 800mg  for pain.  Congested in chest, cough with white mucus.  Denies fever, chills, body aches.  HST results.     Significant tests/ events reviewed  HST 07/2020 AHI 27/hour, lowest desaturation 81%  PFTs 07/2020 normal  HRCT 05/2020 >> resolved RLL infx, mild, dependent bibasilar scarring and ground-glass, left greater than right -Mild, tubular bronchiectasis  CT chest W con 8/26 >>2 x 4 cm right lower lobe subpleural consolidation.A 6 mm ground-glass nodularity in the left upper lobe   CT abdomen 09/2019 clear  lungs  Review of Systems neg for any significant sore throat, dysphagia, itching, sneezing, nasal congestion or excess/ purulent secretions, fever, chills, sweats, unintended wt loss, pleuritic or exertional cp, hempoptysis, orthopnea pnd or change in chronic leg swelling.  Also denies presyncope, palpitations, heartburn, abdominal pain, nausea, vomiting, diarrhea or change in bowel or urinary habits, dysuria,hematuria, rash, arthralgias, visual complaints, headache, numbness weakness or ataxia.     Objective:   Physical Exam  Televisit      Assessment & Plan:    Acute bronchitis -she has already been given amoxicillin by her dentist and she can complete the course.  Advised her to take Mucinex for airway clearance due to underlying bronchiectasis.  She does not have hypertonic saline nebs but will use flutter valve If fever, loss of taste/smell, will need Covid testing. Her PFTs were normal which is reassuring, reviewed with patient  OSA -moderate degree We discussed treatment options including oral appliance and CPAP device.  Given moderate to severe degree, would favor CPAP.  She was originally hesitant to trial since her brother had a bad poor experience. We will schedule formal CPAP titration study. We will also go ahead and write prescription for auto CPAP 5 to 12 cm with nasal mask of choice Common issues with CPAP were discussed  Total time spent x 9/26   V. 10/2019 MD

## 2020-09-04 NOTE — Telephone Encounter (Signed)
Pt is returning call - still looking for the results for HST - also states that she went to the dentist and they put her on amoxicillin - states that she now feels that the congestion is moving down into chest - please advise if Dr. Vassie Loll would like to change the antibiotic or what he would like to do - pharmacy - [facility] in [place] - pt CB#  [phone]

## 2020-09-04 NOTE — Telephone Encounter (Signed)
Called and spoke with patient, will order CPAP from [facility].  Patient verbalized understanding.  Nothing further needed.

## 2020-09-04 NOTE — Addendum Note (Signed)
Addended by: Delrae Rend on: 09/04/2020 12:26 PM   Modules accepted: Orders

## 2020-09-09 NOTE — Telephone Encounter (Signed)
RA please advise on the sleep study results per pts request.  She stated that she cannot open it in mychart.  Thanks

## 2020-09-10 NOTE — Telephone Encounter (Signed)
Scanned report does not open in my chart. Please send copy of test to patient for her records and to PCP

## 2020-09-11 NOTE — Telephone Encounter (Signed)
I called and spoke with pt and explained why her results are not available in mychart  Copy mailed to her (address verified) and also faxed copy to her PCP, Dr Delora Fuel at [phone] Nothing further needed per pt

## 2020-09-12 ENCOUNTER — Telehealth: Payer: Self-pay | Admitting: Pulmonary Disease

## 2020-09-12 NOTE — Telephone Encounter (Signed)
Pt is scheduled for a split night study for 09/17/20  This is what RA ordered at her recent visit 09/03/20  I called the pt and she did not answer and her VM was full so unable to leave msg, [facility]

## 2020-09-13 ENCOUNTER — Other Ambulatory Visit: Payer: Self-pay | Admitting: Psychiatry

## 2020-09-13 ENCOUNTER — Telehealth: Payer: Self-pay

## 2020-09-13 MED ORDER — DIAZEPAM 2 MG PO TABS: 2.0000 mg | ORAL_TABLET | Freq: Every day | ORAL | 0 refills | Status: DC | PRN

## 2020-09-13 NOTE — Telephone Encounter (Signed)
Appointment - Telephone call with pt to inform Dr. Modesta Messing sent in a one time refill of her diazepam and requested her to make a new appointment within the next 30 days. Patient agree and transferred her to admin assistant to schedule.

## 2020-09-13 NOTE — Telephone Encounter (Signed)
Ordered. I don't see that she is scheduled with me for follow up- advise her to have one if she does not have one. No more refill without evaluation.   I have utilized the [place] Controlled Substances Reporting System (PMP AWARxE) to confirm adherence regarding the patient's medication. My review reveals appropriate prescription fills.

## 2020-09-13 NOTE — Telephone Encounter (Signed)
Medication refill request - Fax from pt's [facility] in [place] requesting a refill of Diazepam 2mg , once daily prn.  Patient's next appointment 10/04/20.
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
## 2020-09-19 ENCOUNTER — Telehealth (INDEPENDENT_AMBULATORY_CARE_PROVIDER_SITE_OTHER): Payer: Medicare HMO | Admitting: Psychiatry

## 2020-09-19 ENCOUNTER — Other Ambulatory Visit: Payer: Self-pay

## 2020-09-19 ENCOUNTER — Encounter: Payer: Self-pay | Admitting: Psychiatry

## 2020-09-19 DIAGNOSIS — S134XXA Sprain of ligaments of cervical spine, initial encounter: Secondary | ICD-10-CM | POA: Diagnosis not present

## 2020-09-19 DIAGNOSIS — M9901 Segmental and somatic dysfunction of cervical region: Secondary | ICD-10-CM | POA: Diagnosis not present

## 2020-09-19 DIAGNOSIS — S233XXA Sprain of ligaments of thoracic spine, initial encounter: Secondary | ICD-10-CM | POA: Diagnosis not present

## 2020-09-19 DIAGNOSIS — F411 Generalized anxiety disorder: Secondary | ICD-10-CM | POA: Diagnosis not present

## 2020-09-19 DIAGNOSIS — S338XXA Sprain of other parts of lumbar spine and pelvis, initial encounter: Secondary | ICD-10-CM | POA: Diagnosis not present

## 2020-09-19 DIAGNOSIS — M9903 Segmental and somatic dysfunction of lumbar region: Secondary | ICD-10-CM | POA: Diagnosis not present

## 2020-09-19 DIAGNOSIS — F330 Major depressive disorder, recurrent, mild: Secondary | ICD-10-CM | POA: Diagnosis not present

## 2020-09-19 DIAGNOSIS — M9902 Segmental and somatic dysfunction of thoracic region: Secondary | ICD-10-CM | POA: Diagnosis not present

## 2020-09-19 DIAGNOSIS — M47816 Spondylosis without myelopathy or radiculopathy, lumbar region: Secondary | ICD-10-CM | POA: Diagnosis not present

## 2020-09-19 MED ORDER — NORTRIPTYLINE HCL 50 MG PO CAPS
50.0000 mg | ORAL_CAPSULE | Freq: Every day | ORAL | 0 refills | Status: DC
Start: 2020-10-10 — End: 2020-11-05

## 2020-09-19 MED ORDER — DULOXETINE HCL 60 MG PO CPEP
120.0000 mg | ORAL_CAPSULE | Freq: Every day | ORAL | 0 refills | Status: DC
Start: 2020-10-22 — End: 2021-01-09

## 2020-09-19 MED ORDER — QUETIAPINE FUMARATE 25 MG PO TABS: 25.0000 mg | ORAL_TABLET | Freq: Every day | ORAL | 1 refills | Status: DC

## 2020-09-19 NOTE — Patient Instructions (Signed)
1. Continue duloxetine 120 mg daily 2.Continuenortriptyline 50mg  at night  3. Start quetiapine 25 mg at night  4.Discontinue Valium 5.Next appointment:3/3 at 3:30

## 2020-09-20 ENCOUNTER — Other Ambulatory Visit: Payer: Self-pay | Admitting: Family Medicine

## 2020-09-20 NOTE — Telephone Encounter (Signed)
Called and spoke with pt to see if she had the split night study done and she said due to the weather, she was not able to get it. Pt said she is willing to have this rescheduled. Routing to PCCs.

## 2020-09-23 ENCOUNTER — Other Ambulatory Visit: Payer: Self-pay | Admitting: *Deleted

## 2020-09-23 MED ORDER — GABAPENTIN 800 MG PO TABS
800.0000 mg | ORAL_TABLET | Freq: Three times a day (TID) | ORAL | 0 refills | Status: DC
Start: 2020-09-23 — End: 2020-11-26

## 2020-09-23 NOTE — Telephone Encounter (Signed)
Pt has been rescheduled to 1/30 @ 8  PM at [facility].  Gave pt appt info.

## 2020-09-29 ENCOUNTER — Other Ambulatory Visit: Payer: Self-pay

## 2020-09-29 ENCOUNTER — Ambulatory Visit: Payer: Medicare HMO | Attending: Pulmonary Disease | Admitting: Pulmonary Disease

## 2020-09-29 DIAGNOSIS — G4736 Sleep related hypoventilation in conditions classified elsewhere: Secondary | ICD-10-CM | POA: Diagnosis not present

## 2020-09-29 DIAGNOSIS — G4733 Obstructive sleep apnea (adult) (pediatric): Secondary | ICD-10-CM | POA: Diagnosis not present

## 2020-09-29 DIAGNOSIS — R0902 Hypoxemia: Secondary | ICD-10-CM | POA: Diagnosis not present

## 2020-10-01 ENCOUNTER — Telehealth: Payer: Self-pay | Admitting: Pulmonary Disease

## 2020-10-01 DIAGNOSIS — G4733 Obstructive sleep apnea (adult) (pediatric): Secondary | ICD-10-CM

## 2020-10-01 NOTE — Telephone Encounter (Signed)
NPSG confirmed severe OSA CPAP 14 cm required Please confirm that autoCPAP Rx has been sent Arrange for oV 1 month after CPAP use

## 2020-10-01 NOTE — Procedures (Signed)
Patient Name: Laura Burnett, Laura Burnett Date: 09/29/2020 Gender: Female D.O.B: 1966/06/01 Age (years): 18 Referring Provider: Kara Mead MD, ABSM Height (inches): 61 Interpreting Physician: Kara Mead MD, ABSM Weight (lbs): 201 RPSGT: Rosebud Poles BMI: 38 MRN: [id number] Neck Size: 15.50 <br> <br> CLINICAL INFORMATION Sleep Study Type: Split Night CPAP    Indication for sleep study: OSA , HST 07/2020 AHI 27/hour, lowest desaturation 81%   Epworth Sleepiness Score: 5  SLEEP STUDY TECHNIQUE As per the AASM Manual for the Scoring of Sleep and Associated Events v2.3 (April 2016) with a hypopnea requiring 4% desaturations.  The channels recorded and monitored were frontal, central and occipital EEG, electrooculogram (EOG), submentalis EMG (chin), nasal and oral airflow, thoracic and abdominal wall motion, anterior tibialis EMG, snore microphone, electrocardiogram, and pulse oximetry. Continuous positive airway pressure (CPAP) was initiated when the patient met split night criteria and was titrated according to treat sleep-disordered breathing.  MEDICATIONS Medications self-administered by patient taken the night of the study : N/A  RESPIRATORY PARAMETERS Diagnostic  Total AHI (/hr): 41.7 RDI (/hr): 41.7 OA Index (/hr): 0.8 CA Index (/hr): 0.0 REM AHI (/hr): N/A NREM AHI (/hr): 41.7 Supine AHI (/hr): N/A Non-supine AHI (/hr): 41.7 Min O2 Sat (%): 80.0 Mean O2 (%): 90.8 Time below 88% (min): 13.5   Titration  Optimal Pressure (cm): 14 AHI at Optimal Pressure (/hr): 8.4 Min O2 at Optimal Pressure (%): 87 Supine % at Optimal (%): 15 Sleep % at Optimal (%): 84   SLEEP ARCHITECTURE The recording time for the entire night was 423.1 minutes.  During a baseline period of 187.1 minutes, the patient slept for 149.5 minutes in REM and nonREM, yielding a sleep efficiency of 79.9%%. Sleep onset after lights out was 35.3 minutes with a REM latency of N/A minutes. The patient spent 2.3%% of the  night in stage N1 sleep, 68.2%% in stage N2 sleep, 29.4%% in stage N3 and 0% in REM.   During the titration period of 232.1 minutes, the patient slept for 198.8 minutes in REM and nonREM, yielding a sleep efficiency of 85.7%%. Sleep onset after CPAP initiation was 6.3 minutes with a REM latency of 76.5 minutes. The patient spent 5.0%% of the night in stage N1 sleep, 56.5%% in stage N2 sleep, 19.4%% in stage N3 and 19.1% in REM.  CARDIAC DATA The 2 lead EKG demonstrated sinus rhythm. The mean heart rate was 100.0 beats per minute. Other EKG findings include: None.  LEG MOVEMENT DATA The total Periodic Limb Movements of Sleep (PLMS) were 0. The PLMS index was 0.0 .  IMPRESSIONS - Severe obstructive sleep apnea occurred during the diagnostic portion of the study (AHI = 41.7/hour). An optimal PAP pressure could not be selected for this patient based on the available study data. - No significant central sleep apnea occurred during the diagnostic portion of the study (CAI = 0.0/hour). - Severe oxygen desaturation was noted during the diagnostic portion of the study (Min O2 = 80.0%). - The patient snored with loud snoring volume during the diagnostic portion of the study. - No cardiac abnormalities were noted during this study. - Clinically significant periodic limb movements did not occur during sleep.   DIAGNOSIS - Obstructive Sleep Apnea (G47.33) - Nocturnal Hypoxemia (G47.36)   RECOMMENDATIONS - CPAP can be initiated at 14 cm with a small full face mask. Alternatively auto CPAP 5-15 cm can be used - Avoid alcohol, sedatives and other CNS depressants that may worsen sleep apnea and disrupt normal sleep architecture. -  Sleep hygiene should be reviewed to assess factors that may improve sleep quality. - Weight management and regular exercise should be initiated or continued. - Return to [facility] Center for re-evaluation after 4 weeks of therapy   Kara Mead MD Board Certified in [place]

## 2020-10-04 ENCOUNTER — Encounter: Payer: Self-pay | Admitting: Pulmonary Disease

## 2020-10-04 ENCOUNTER — Other Ambulatory Visit: Payer: Self-pay

## 2020-10-04 ENCOUNTER — Ambulatory Visit (INDEPENDENT_AMBULATORY_CARE_PROVIDER_SITE_OTHER): Payer: Medicare HMO | Admitting: Pulmonary Disease

## 2020-10-04 DIAGNOSIS — G4733 Obstructive sleep apnea (adult) (pediatric): Secondary | ICD-10-CM

## 2020-10-04 DIAGNOSIS — J479 Bronchiectasis, uncomplicated: Secondary | ICD-10-CM

## 2020-10-04 NOTE — Assessment & Plan Note (Signed)
No evidence of exacerbation.  No antibiotics required today

## 2020-10-04 NOTE — Patient Instructions (Signed)
  CPAP order has been sent  OK to get Luna if no other brand available in 2-4 weeks

## 2020-10-04 NOTE — Telephone Encounter (Signed)
Patient arrived today 10/04/2020 for follow up office visit with Dr Elsworth Soho. Went over NPSG result with patient. Dr Elsworth Soho in to see patient. Patient expressed full understanding of result and agreeable Dr Bari Mantis recommendations. Order placed for CPAP auto 5-15 cm per verbal order from Dr Elsworth Soho. Patient stated she still has not received the CPAP machine. Nothing further needed at this time.

## 2020-10-04 NOTE — Assessment & Plan Note (Signed)
The pathophysiology of obstructive sleep apnea , it's cardiovascular consequences & modes of treatment including CPAP were discused with the patient in detail & they evidenced understanding.  She is willing to try CPAP device.  Oral appliance would be suboptimal for severe OSA.  She has not obtain the machine yet due to the backlog.  We discussed alternative brand called Luna , she would be willing to try this.  Will wait 2 to 3 weeks if she can get a ResMed device otherwise okay to proceed with getting Luna device

## 2020-10-04 NOTE — Progress Notes (Signed)
   Subjective:    Patient ID: Laura Burnett, female    DOB: 1966/02/16, 55 y.o.   MRN: [id number]  HPI  55 year old non-smokerfor follow-up of OSA and mild bronchiectasis  PMH -asthma, hypothyroid, PTSD, anxiety, gastritis, fibromyalgia L5/S1 spinal fusion She had an ED visit 03/2020 for right hypochondriac pain and CT chest showed right lower lobe subpleural consolidation On initial office visit, she was noted to have bibasal crackles Of note CT abdomen 09/2019 showed clear lung bases  09/04/20 given amox by dentist and had bronchitis at the same time Notes one episode of dark brown sputum, brought Kleenex with her to show me, this was 2 days ago  We reviewed results of sleep test.  Prescription for CPAP has been sent but she has not received it yet due to backlog.  Reports increased sleepiness and tiredness during the day, unable to take care of her granddaughter   Significant tests/ events reviewed  NPSG  08/2020 AHI 42/h, lowest desat 80% HST 07/2020 AHI 27/hour, lowest desaturation 81%  PFTs 07/2020 normal  HRCT 05/2020 >> resolved RLL infx,mild, dependent bibasilar scarring and ground-glass, left greater than right -Mild, tubular bronchiectasis  CT chest W con 8/26 >>2 x 4 cm right lower lobe subpleural consolidation.A 6 mm ground-glass nodularity in the left upper lobe   CT abdomen 09/2019 clear lungs   Review of Systems Patient denies significant dyspnea,cough, hemoptysis,  chest pain, palpitations, pedal edema, orthopnea, paroxysmal nocturnal dyspnea, lightheadedness, nausea, vomiting, abdominal or  leg pains      Objective:   Physical Exam  Gen. Pleasant, obese, in no distress ENT - no lesions, no post nasal drip Neck: No JVD, no thyromegaly, no carotid bruits Lungs: no use of accessory muscles, no dullness to percussion, decreased without rales or rhonchi  Cardiovascular: Rhythm regular, heart sounds  normal, no murmurs or gallops, no peripheral  edema Musculoskeletal: No deformities, no cyanosis or clubbing , no tremors       Assessment & Plan:

## 2020-10-08 DIAGNOSIS — M47816 Spondylosis without myelopathy or radiculopathy, lumbar region: Secondary | ICD-10-CM | POA: Diagnosis not present

## 2020-10-08 DIAGNOSIS — S134XXA Sprain of ligaments of cervical spine, initial encounter: Secondary | ICD-10-CM | POA: Diagnosis not present

## 2020-10-08 DIAGNOSIS — M9901 Segmental and somatic dysfunction of cervical region: Secondary | ICD-10-CM | POA: Diagnosis not present

## 2020-10-08 DIAGNOSIS — S233XXA Sprain of ligaments of thoracic spine, initial encounter: Secondary | ICD-10-CM | POA: Diagnosis not present

## 2020-10-08 DIAGNOSIS — M9902 Segmental and somatic dysfunction of thoracic region: Secondary | ICD-10-CM | POA: Diagnosis not present

## 2020-10-08 DIAGNOSIS — S338XXA Sprain of other parts of lumbar spine and pelvis, initial encounter: Secondary | ICD-10-CM | POA: Diagnosis not present

## 2020-10-08 DIAGNOSIS — M9903 Segmental and somatic dysfunction of lumbar region: Secondary | ICD-10-CM | POA: Diagnosis not present

## 2020-10-09 NOTE — Telephone Encounter (Signed)
CT chest showed 'mild thyromegaly' DO not think this is contributing to her OSA She has been evaluated by ENT dr Blenda Nicely & is welcome to see her for a second opinion

## 2020-10-11 ENCOUNTER — Other Ambulatory Visit: Payer: Self-pay

## 2020-10-11 ENCOUNTER — Ambulatory Visit (INDEPENDENT_AMBULATORY_CARE_PROVIDER_SITE_OTHER): Payer: Medicare HMO | Admitting: Family Medicine

## 2020-10-11 ENCOUNTER — Encounter: Payer: Self-pay | Admitting: Family Medicine

## 2020-10-11 VITALS — BP 115/85 | HR 105 | Ht 61.0 in | Wt 202.6 lb

## 2020-10-11 DIAGNOSIS — E049 Nontoxic goiter, unspecified: Secondary | ICD-10-CM

## 2020-10-11 DIAGNOSIS — G4733 Obstructive sleep apnea (adult) (pediatric): Secondary | ICD-10-CM

## 2020-10-11 DIAGNOSIS — R490 Dysphonia: Secondary | ICD-10-CM

## 2020-10-11 NOTE — Patient Instructions (Signed)
Thank you for coming in to see [place] today! Please see below to review our plan for today's visit:  1. We are scheduling you for Ultrasound of your Thyroid to re-evaluate goiter.  2. Follow up with Pulmonology when you're scheduled.   Please call the clinic at [phone] if your symptoms worsen or you have any concerns. It was our pleasure to serve you!   Dr. Milus Banister [facility]

## 2020-10-11 NOTE — Progress Notes (Signed)
    SUBJECTIVE:   CHIEF COMPLAINT / HPI:   Severe sleep apnea thyromegaly: Patient presents today with concerns for recent sleep study (09/29/2020) that revealed severe sleep apnea, patient has been ordered a CPAP but has not arrived yet.  She has not been using CPAP at night.  Patient reports that she also has a history of enlarged thyroid.  She also reports more recent voice changes and hoarseness that started 3-4 months ago but has worsened over the last few months.  The patient is concerned that her enlarged thyroid might be contributing to her sleep apnea.  Patient endorses that she smoked for 4 years about 30 years ago Patient is scheduled to follow-up with pulmonologist in 2-3 weeks.  PERTINENT  PMH / PSH:  Enlarged thyroid, OSA, hoarseness (2018), anxiety disorder, bronchiectasis   OBJECTIVE:   BP 115/85   Pulse (!) 105   Ht 5\' 1"  (1.549 m)   Wt 202 lb 9.6 oz (91.9 kg)   SpO2 96%   BMI 38.28 kg/m    Physical exam: General: Well-appearing patient, no apparent distress Neck: Diffusely mildly enlarged thyroid without goiter, trachea midline Throat: No pharyngeal erythema or tonsillar exudate Respiratory: CTA bilaterally, comfortable work of breathing Cardio: RRR, S1-S2 present, no murmurs appreciated   ASSESSMENT/PLAN:   OSA (obstructive sleep apnea) -Plan for patient to follow-up with pulmonologist -Start using CPAP once available  Enlarged thyroid Diffusely enlarged thyroid appreciated on physical exam, also appreciated on previous imaging.  Size/shape of thyroid remains constant over 2-year time frame. -Due to concerns for hoarseness ultrasound of thyroid ordered scheduled for patient -TSH checked on patient    Daisy Floro, Portal

## 2020-10-11 NOTE — Assessment & Plan Note (Signed)
-  Plan for patient to follow-up with pulmonologist -Start using CPAP once available

## 2020-10-11 NOTE — Assessment & Plan Note (Addendum)
Diffusely enlarged thyroid appreciated on physical exam, also appreciated on previous imaging.  Size/shape of thyroid remains constant over 2-year time frame. -Due to concerns for hoarseness ultrasound of thyroid ordered scheduled for patient -TSH checked on patient

## 2020-10-12 LAB — TSH: TSH: 3.74 u[IU]/mL (ref 0.450–4.500)

## 2020-10-23 ENCOUNTER — Other Ambulatory Visit: Payer: Medicare HMO

## 2020-10-24 ENCOUNTER — Encounter: Payer: Self-pay | Admitting: Family Medicine
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
## 2020-10-31 ENCOUNTER — Other Ambulatory Visit: Payer: Self-pay

## 2020-10-31 ENCOUNTER — Telehealth (INDEPENDENT_AMBULATORY_CARE_PROVIDER_SITE_OTHER): Payer: Medicare HMO | Admitting: Psychiatry

## 2020-10-31 ENCOUNTER — Encounter: Payer: Self-pay | Admitting: Psychiatry

## 2020-10-31 DIAGNOSIS — F330 Major depressive disorder, recurrent, mild: Secondary | ICD-10-CM

## 2020-10-31 MED ORDER — ARIPIPRAZOLE 2 MG PO TABS: 2.0000 mg | ORAL_TABLET | Freq: Every day | ORAL | 1 refills | Status: DC

## 2020-10-31 NOTE — Patient Instructions (Signed)
1. Continue duloxetine 120 mg daily 2.Continuenortriptyline 50mg  at night 3. Start abilify 2 mg daily  4.Discontinue Valium 5.Next appointment:4/14 at 11 AM

## 2020-11-01 ENCOUNTER — Ambulatory Visit
Admission: RE | Admit: 2020-11-01 | Discharge: 2020-11-01 | Disposition: A | Discharge status: Home / Self Care | Payer: Medicare HMO | Source: Ambulatory Visit | Attending: Family Medicine | Admitting: Family Medicine

## 2020-11-01 DIAGNOSIS — R490 Dysphonia: Secondary | ICD-10-CM

## 2020-11-01 DIAGNOSIS — E049 Nontoxic goiter, unspecified: Secondary | ICD-10-CM

## 2020-11-01 DIAGNOSIS — E010 Iodine-deficiency related diffuse (endemic) goiter: Secondary | ICD-10-CM | POA: Diagnosis not present

## 2020-11-05 ENCOUNTER — Other Ambulatory Visit (HOSPITAL_COMMUNITY): Payer: Self-pay | Admitting: Psychiatry

## 2020-11-05 DIAGNOSIS — M9902 Segmental and somatic dysfunction of thoracic region: Secondary | ICD-10-CM | POA: Diagnosis not present

## 2020-11-05 DIAGNOSIS — M9903 Segmental and somatic dysfunction of lumbar region: Secondary | ICD-10-CM | POA: Diagnosis not present

## 2020-11-05 DIAGNOSIS — S134XXA Sprain of ligaments of cervical spine, initial encounter: Secondary | ICD-10-CM | POA: Diagnosis not present

## 2020-11-05 DIAGNOSIS — M9901 Segmental and somatic dysfunction of cervical region: Secondary | ICD-10-CM | POA: Diagnosis not present

## 2020-11-05 DIAGNOSIS — M47816 Spondylosis without myelopathy or radiculopathy, lumbar region: Secondary | ICD-10-CM | POA: Diagnosis not present

## 2020-11-05 DIAGNOSIS — S338XXA Sprain of other parts of lumbar spine and pelvis, initial encounter: Secondary | ICD-10-CM | POA: Diagnosis not present

## 2020-11-05 DIAGNOSIS — S233XXA Sprain of ligaments of thoracic spine, initial encounter: Secondary | ICD-10-CM | POA: Diagnosis not present

## 2020-11-05 MED ORDER — NORTRIPTYLINE HCL 50 MG PO CAPS
50.0000 mg | ORAL_CAPSULE | Freq: Every day | ORAL | 0 refills | Status: DC
Start: 2020-11-05 — End: 2021-01-09

## 2020-11-06 ENCOUNTER — Telehealth: Payer: Self-pay | Admitting: Family Medicine

## 2020-11-06 NOTE — Telephone Encounter (Signed)
Patient is wanting Dr. Ouida Sills to call her to discuss what the next step is since she has enlarged thyroids. Please advise. Thanks!

## 2020-11-08 ENCOUNTER — Ambulatory Visit: Payer: Medicare HMO

## 2020-11-08 ENCOUNTER — Telehealth: Payer: Self-pay | Admitting: Pulmonary Disease

## 2020-11-08 ENCOUNTER — Ambulatory Visit (HOSPITAL_COMMUNITY)
Admission: RE | Admit: 2020-11-08 | Discharge: 2020-11-08 | Disposition: A | Discharge status: Home / Self Care | Payer: Medicare HMO | Source: Ambulatory Visit | Attending: Pulmonary Disease | Admitting: Pulmonary Disease

## 2020-11-08 ENCOUNTER — Encounter: Payer: Self-pay | Admitting: *Deleted

## 2020-11-08 DIAGNOSIS — J479 Bronchiectasis, uncomplicated: Secondary | ICD-10-CM | POA: Diagnosis not present

## 2020-11-08 DIAGNOSIS — R079 Chest pain, unspecified: Secondary | ICD-10-CM | POA: Diagnosis not present

## 2020-11-08 NOTE — Telephone Encounter (Signed)
Not convinced this is pneumonia, we can order chest x-ray which she can obtain at [facility] and if we find evidence of pneumonia, we can give her antibiotics. Otherwise would suggest Advil OTC 400 mg twice daily as needed with food If persists then we can see her in the office next week at [facility]

## 2020-11-08 NOTE — Telephone Encounter (Signed)
Patient calling related to the results and wants to know why her right side is having pain. "Similar mildly elevated left Hemidiaphragm"   Sending to Dr. Elsworth Soho for further recommendations.

## 2020-11-08 NOTE — Telephone Encounter (Signed)
Primary Pulmonologist: RA Last office visit and with whom: 10/04/20 What do we see them for (pulmonary problems): OSA and bronchiectasis  Last OV assessment/plan:  Author: Rigoberto Noel, MD Author Type: Physician Filed: 10/04/2020 11:44 AM  Note Status: Written Cosign: Cosign Not Required Encounter Date: 10/04/2020  Problem: Bronchiectasis without complication [facility])  Editor: Rigoberto Noel, MD (Physician)              No evidence of exacerbation.  No antibiotics required today      Assessment & Plan Note by Rigoberto Noel, MD at 10/04/2020 11:43 AM  Author: Rigoberto Noel, MD Author Type: Physician Filed: 10/04/2020 11:44 AM  Note Status: Written Cosign: Cosign Not Required Encounter Date: 10/04/2020  Problem: OSA (obstructive sleep apnea)  Editor: Rigoberto Noel, MD (Physician)              The pathophysiology of obstructive sleep apnea , it's cardiovascular consequences & modes of treatment including CPAP were discused with the patient in detail & they evidenced understanding.  She is willing to try CPAP device.  Oral appliance would be suboptimal for severe OSA.  She has not obtain the machine yet due to the backlog.  We discussed alternative brand called Luna , she would be willing to try this.  Will wait 2 to 3 weeks if she can get a ResMed device otherwise okay to proceed with getting Luna device         Was appointment offered to patient (explain)?  Patient wanted recommendations.   Reason for call: Spoke with patient. She stated that she woke up this morning with sharp pains under her right rib. She notices the sharp pain when she attempts to take a deep breath or coughs. She has not been able to cough to get anything up due to the pain. She stated that she had these same pains back in August when she had PNA. She denies any fevers or additional body aches. SOB has not changed but she is doing more shallow breathing because of the pain.   (examples of things to ask: : When did  symptoms start? Fever? Cough? Productive? Color to sputum? More sputum than usual? Wheezing? Have you needed increased oxygen? Are you taking your respiratory medications? What over the counter measures have you tried?)  Allergies  Allergen Reactions  . Ciprofloxacin Nausea And Vomiting  . Phentermine Other (See Comments)    Tearing, lightheadedness, depression  . Tape Other (See Comments)    " I get red and itchy." Paper tape okay  . Percocet [Oxycodone-Acetaminophen] Hives and Rash  . Tetanus Toxoids Nausea And Vomiting  . Tylenol With Codeine #3 [Acetaminophen-Codeine] Rash    Immunization History  Administered Date(s) Administered  . Influenza,inj,Quad PF,6+ Mos 07/06/2018, 05/09/2019, 06/04/2020  . Moderna Sars-Covid-2 Vaccination 11/23/2019, 12/21/2019, 07/08/2020   She wanted to know if RA could send in something for her. She is also willing to go to an UC in [place] if needed. Pharmacy is [facility] in [place].

## 2020-11-08 NOTE — Telephone Encounter (Signed)
Called spoke with patient.  Let her know Dr. Bari Mantis recommendations.  Appointment made for 11/15/20 in [place].  Patient states if it gets worse she will proceed to the ED due to the pain  Nothing further needed at this time.

## 2020-11-08 NOTE — Telephone Encounter (Signed)
Spoke with patient. She verbalized understanding of RA's recs. I placed the CXR order as stat so that RA can review the results before the weekend. She is aware to go to the ER or UC if the pain gets worse.   Nothing further needed at time of call.

## 2020-11-08 NOTE — Telephone Encounter (Signed)
pt is calling because she is experiencing  sharp pains under right ribs when she takes a deep breaths. Pt said that the feeling is similar to what she felt in  03/2020 states then she had Pneumonia. Would like to see if  Dr. Elsworth Soho could call in something, she also mentioned she is not currently coughing& it hurts more to lay down..  Pt asked about going to a urgent care if its one in [place]. pt uses [facility] [address] in [place]  [phone]

## 2020-11-08 NOTE — Telephone Encounter (Signed)
Not sure why. Chest x-ray does not show any broken ribs or evidence of pneumonia or evidence of pleurisy. She can follow-up with her PCP or we can try to see if we have an opening at [facility] next week if her symptoms persist

## 2020-11-15 ENCOUNTER — Ambulatory Visit: Payer: Medicare HMO | Admitting: Pulmonary Disease

## 2020-11-19 ENCOUNTER — Other Ambulatory Visit: Payer: Self-pay | Admitting: Family Medicine

## 2020-11-20 ENCOUNTER — Ambulatory Visit: Payer: Medicare HMO

## 2020-11-21 ENCOUNTER — Telehealth: Payer: Self-pay

## 2020-11-21 NOTE — Telephone Encounter (Signed)
Decline. It was discontinued

## 2020-11-21 NOTE — Telephone Encounter (Signed)
Decline. It was discontinued (this is a second request)

## 2020-11-21 NOTE — Telephone Encounter (Signed)
Received fax requesting a refill on the diazepam 2mg   diazepam (VALIUM) 2 MG tablet Medication Date: 09/13/2020 Department: [facility] Ordering/Authorizing: Norman Clay, MD    Order Providers  Prescribing Provider Encounter Provider  Norman Clay, MD Norman Clay, MD   Outpatient Medication Detail   Disp Refills Start End   diazepam (VALIUM) 2 MG tablet 30 tablet 0 09/13/2020    Sig - Route: Take 1 tablet (2 mg total) by mouth daily as needed for anxiety. - Oral   Sent to pharmacy as: diazepam (VALIUM) 2 MG tablet   E-Prescribing Status: Receipt confirmed by pharmacy (09/13/2020 10:44 AM EST)    Pharmacy  [facility] [place], [place]

## 2020-11-21 NOTE — Telephone Encounter (Signed)
  Received a fax request for a refill for diazepam   diazepam (VALIUM) 2 MG tablet Medication Date: 09/13/2020 Department: [facility] Ordering/Authorizing: Norman Clay, MD    Order Providers  Prescribing Provider Encounter Provider  Norman Clay, MD Norman Clay, MD   Outpatient Medication Detail   Disp Refills Start End   diazepam (VALIUM) 2 MG tablet 30 tablet 0 09/13/2020    Sig - Route: Take 1 tablet (2 mg total) by mouth daily as needed for anxiety. - Oral   Sent to pharmacy as: diazepam (VALIUM) 2 MG tablet   E-Prescribing Status: Receipt confirmed by pharmacy (09/13/2020 10:44 AM EST)

## 2020-11-22 DIAGNOSIS — J0110 Acute frontal sinusitis, unspecified: Secondary | ICD-10-CM | POA: Diagnosis not present

## 2020-11-22 NOTE — Telephone Encounter (Signed)
This medication is discontinued. Please decline- this is the third request.

## 2020-11-22 NOTE — Telephone Encounter (Signed)
Received fax requesting a refill on the diazepam   diazepam (VALIUM) 2 MG tablet Medication Date: 09/13/2020 Department: [facility] Ordering/Authorizing: Norman Clay, MD    Order Providers  Prescribing Provider Encounter Provider  Norman Clay, MD Norman Clay, MD   Outpatient Medication Detail   Disp Refills Start End   diazepam (VALIUM) 2 MG tablet 30 tablet 0 09/13/2020    Sig - Route: Take 1 tablet (2 mg total) by mouth daily as needed for anxiety. - Oral   Sent to pharmacy as: diazepam (VALIUM) 2 MG tablet   E-Prescribing Status: Receipt confirmed by pharmacy (09/13/2020 10:44 AM EST)    Pharmacy  [place], [place]

## 2020-11-25 ENCOUNTER — Telehealth: Payer: Self-pay

## 2020-11-25 ENCOUNTER — Other Ambulatory Visit: Payer: Self-pay | Admitting: Psychiatry

## 2020-11-25 MED ORDER — DIAZEPAM 2 MG PO TABS: 1.0000 mg | ORAL_TABLET | Freq: Every day | ORAL | 0 refills | Status: DC | PRN

## 2020-11-25 NOTE — Telephone Encounter (Signed)
Please contact and notify the pharmacist that this medication has been discontinued. Will not plan to order refills/respond to this request anymore unless any significant change in the condition.

## 2020-11-25 NOTE — Telephone Encounter (Signed)
received a fax requesting a refill on the diazepam 2mg  take 1 tablet by mouth once daily as needed for anxiety.

## 2020-11-25 NOTE — Telephone Encounter (Signed)
pt called states that the other medication you gave her to sleep made her feel like she was druged. that she slept over 12 hours and she wants to go back on the valiium 2mg 

## 2020-11-25 NOTE — Telephone Encounter (Signed)
Ordered. Please advise her to lower the dose:  valium 1 mg daily as needed for anxiety.   I have utilized the [place] Controlled Substances Reporting System (PMP AWARxE) to confirm adherence regarding the patient's medication. My review reveals appropriate prescription fills.

## 2020-11-26 ENCOUNTER — Other Ambulatory Visit: Payer: Self-pay | Admitting: Family Medicine

## 2020-11-28 IMAGING — US US ABDOMEN LIMITED
1 series · 14 of 25 positions shown · non-contrast
Comparison: None.

CLINICAL DATA: Epigastric pain worse after eating

EXAM:
ULTRASOUND ABDOMEN LIMITED RIGHT UPPER QUADRANT

[Series 1: us abdomen limited · 0.19mm/px · 14 of 103 slices shown]
[im 1/103]
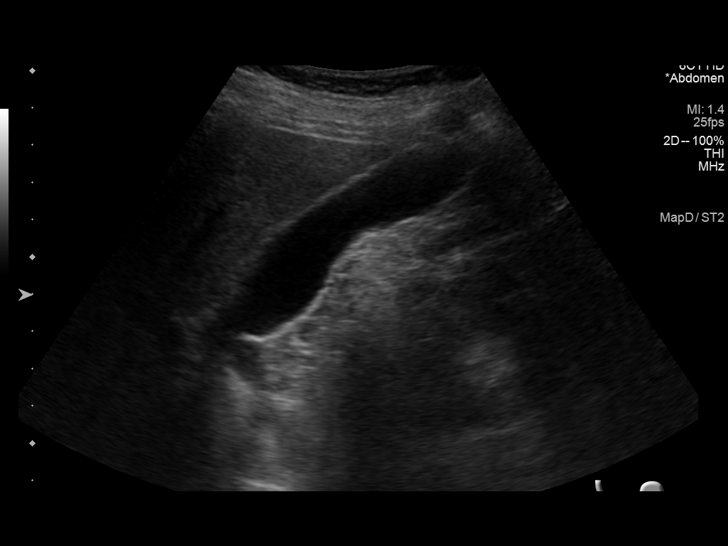
[im 9/103]
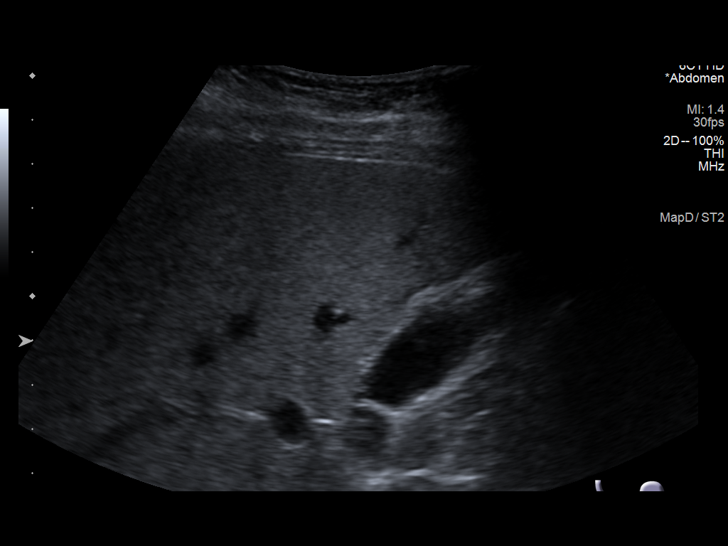
[im 18/103]
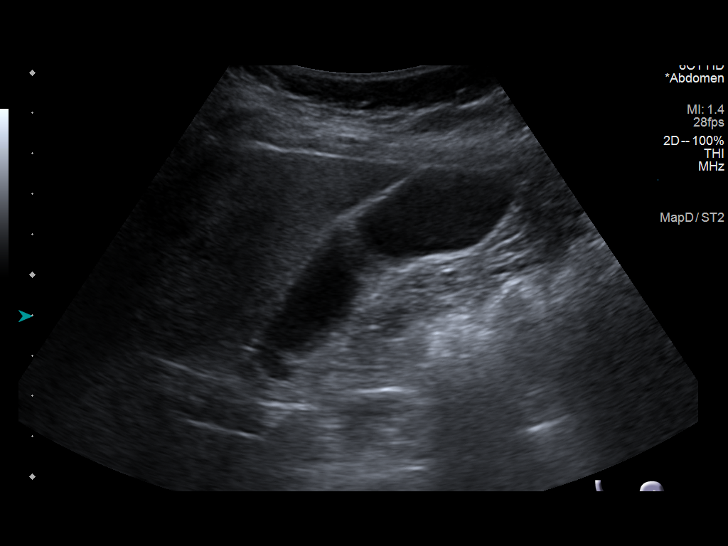
[im 26/103]
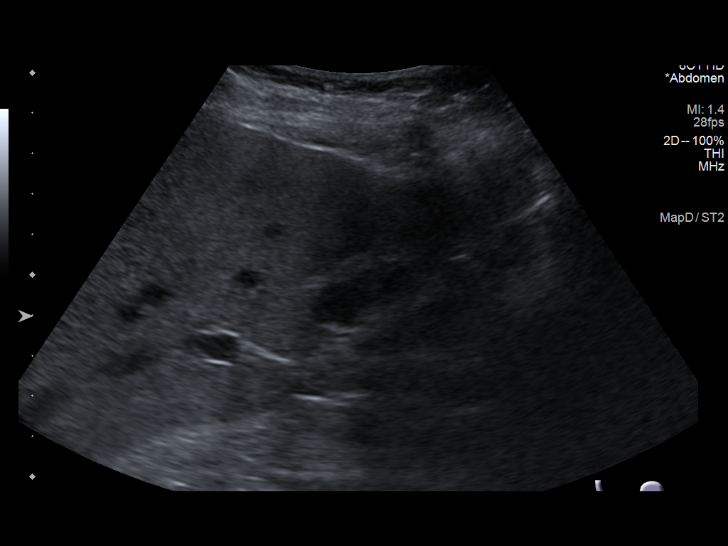
[im 35/103]
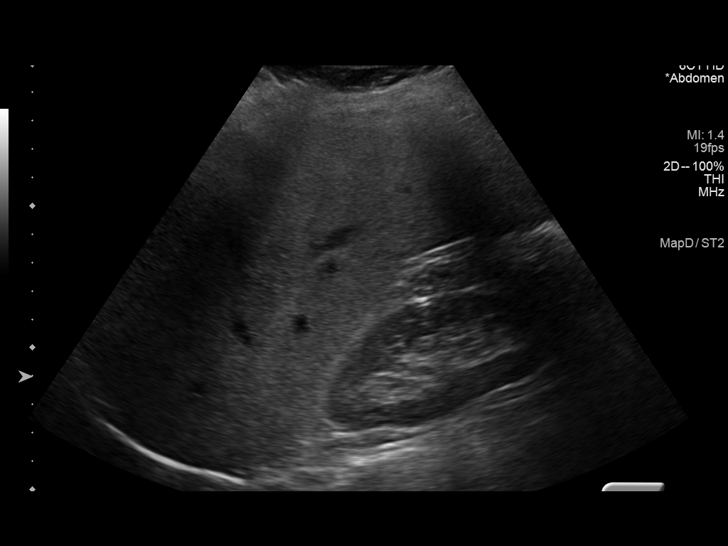
[im 39/103]
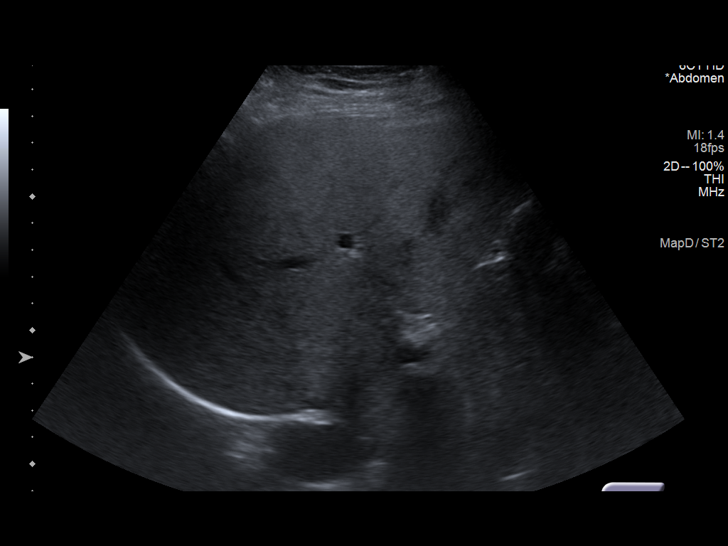
[im 47/103]
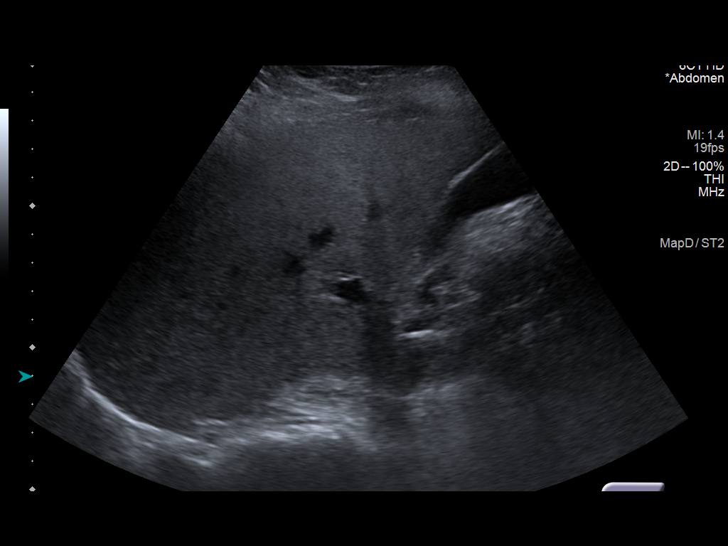
[im 56/103]
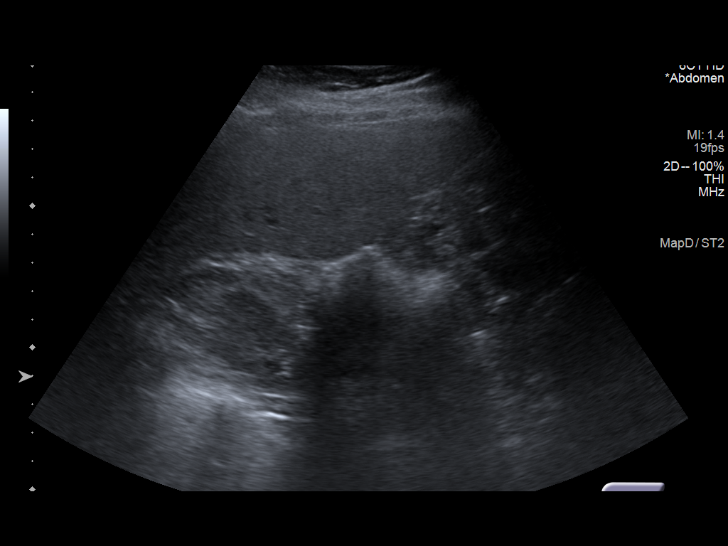
[im 64/103]
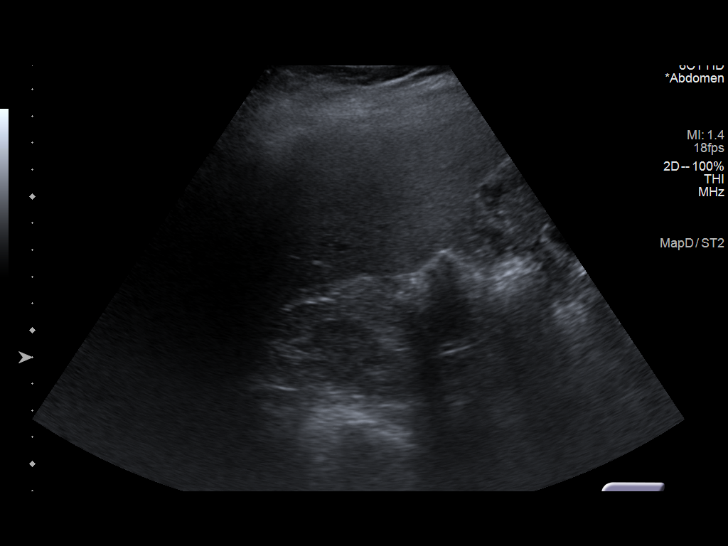
[im 69/103]
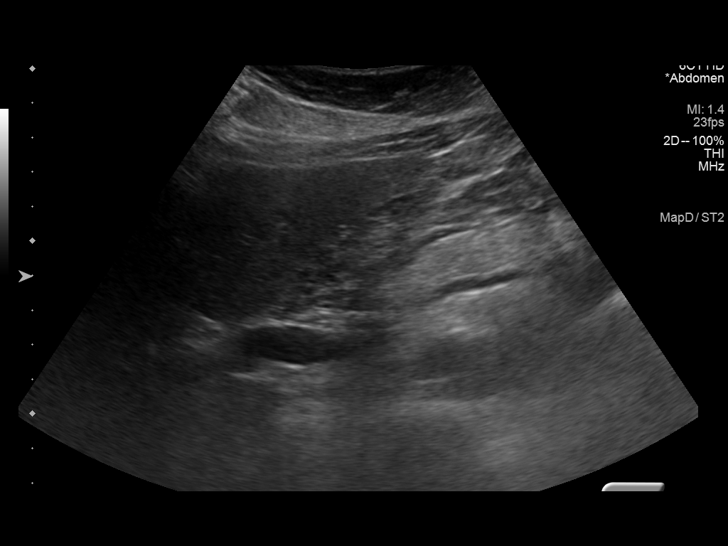
[im 77/103]
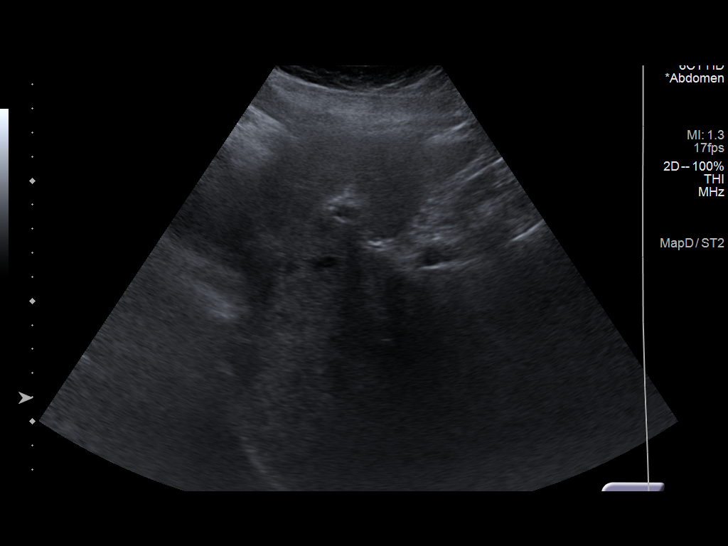
[im 86/103]
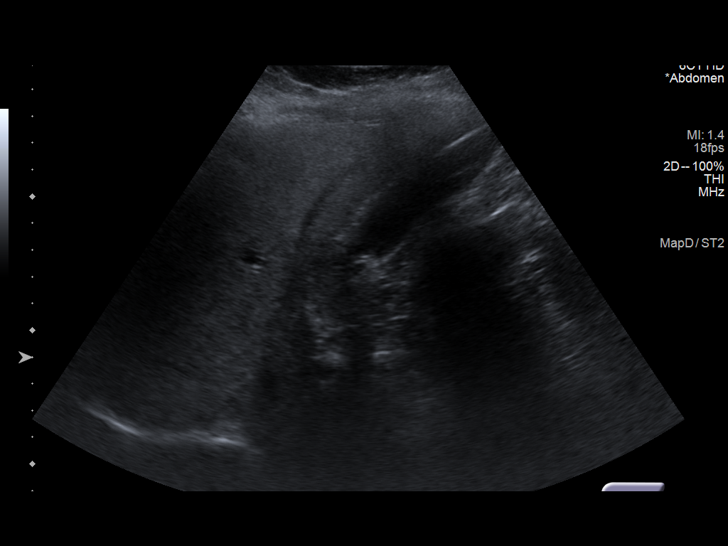
[im 94/103]
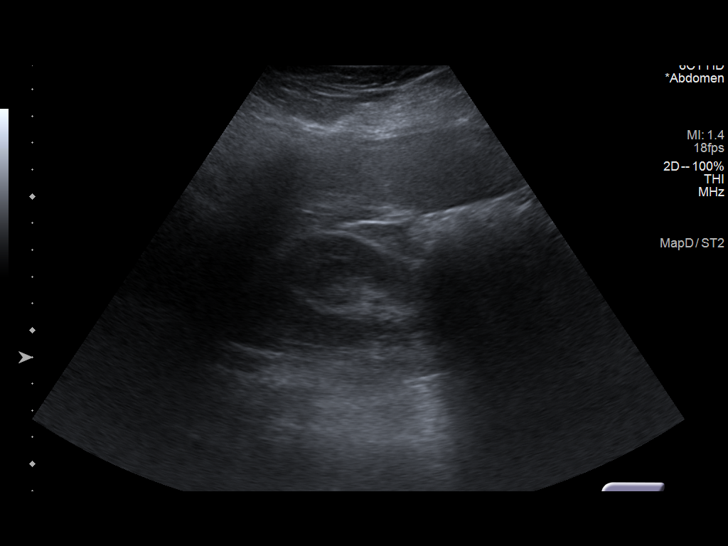
[im 103/103]
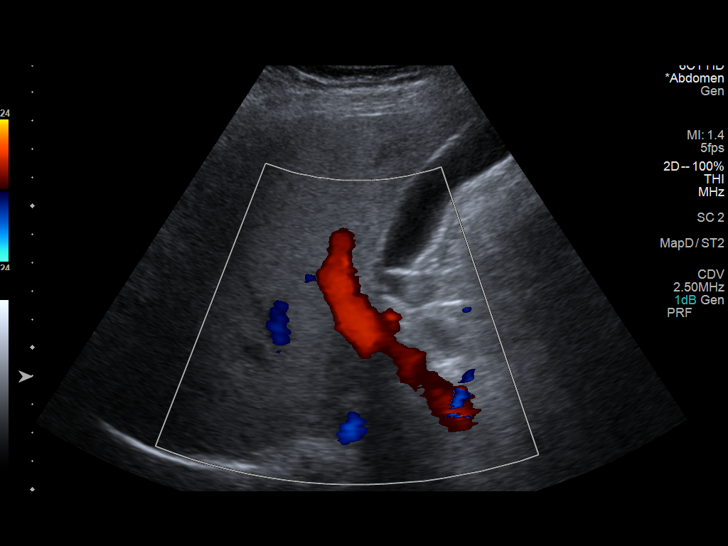

[14 of 25 positions shown; findings below may reference images not displayed]

FINDINGS: Gallbladder:

No gallstones or wall thickening visualized. No sonographic Murphy
sign noted by sonographer.

Common bile duct:

Diameter: 3 mm

Liver:

No focal lesion identified. Increased parenchymal echogenicity.
Portal vein is patent on color Doppler imaging with normal direction
of blood flow towards the liver.

Other: None.
IMPRESSION: No cholelithiasis or sonographic evidence of acute cholecystitis.

Increased liver echogenicity, which may reflect steatosis.

## 2020-11-29 DIAGNOSIS — G4733 Obstructive sleep apnea (adult) (pediatric): Secondary | ICD-10-CM | POA: Diagnosis not present

## 2020-11-29 DIAGNOSIS — G4710 Hypersomnia, unspecified: Secondary | ICD-10-CM | POA: Diagnosis not present

## 2020-12-02 ENCOUNTER — Encounter: Payer: Self-pay | Admitting: Family Medicine

## 2020-12-02 ENCOUNTER — Ambulatory Visit (INDEPENDENT_AMBULATORY_CARE_PROVIDER_SITE_OTHER): Payer: Medicare HMO | Admitting: Family Medicine

## 2020-12-02 ENCOUNTER — Other Ambulatory Visit: Payer: Self-pay

## 2020-12-02 VITALS — BP 114/70 | HR 65 | Ht 61.0 in | Wt 202.1 lb

## 2020-12-02 DIAGNOSIS — J302 Other seasonal allergic rhinitis: Secondary | ICD-10-CM

## 2020-12-02 DIAGNOSIS — R0981 Nasal congestion: Secondary | ICD-10-CM | POA: Diagnosis not present

## 2020-12-02 DIAGNOSIS — Z0289 Encounter for other administrative examinations: Secondary | ICD-10-CM | POA: Diagnosis not present

## 2020-12-02 MED ORDER — LEVOCETIRIZINE DIHYDROCHLORIDE 5 MG PO TABS: 5.0000 mg | ORAL_TABLET | Freq: Every evening | ORAL | 2 refills | Status: DC

## 2020-12-02 NOTE — Patient Instructions (Signed)
It was great seeing you today!  Please check-out at the front desk before leaving the clinic. `  Visit Remembers: - Stop by the pharmacy to pick up your prescriptions  - Continue to work on your healthy eating habits and incorporating exercise into your daily life.    Regarding lab work today:  Due to recent changes in healthcare laws, you may see the results of your imaging and laboratory studies on MyChart before your provider has had a chance to review them.  I understand that in some cases there may be results that are confusing or concerning to you. Not all laboratory results come back in the same time frame and you may be waiting for multiple results in order to interpret others.  Please give [place] 72 hours in order for your provider to thoroughly review all the results before contacting the office for clarification of your results. If everything is normal, you will get a letter in the mail or a message in My Chart. Please give [place] a call if you do not hear from [place] after 2 weeks.  Please bring all of your medications with you to each visit.    If you haven't already, sign up for My Chart to have easy access to your labs results, and communication with your primary care physician.  Feel free to call with any questions or concerns at any time, at [phone].   Take care,  Dr. Rushie [facility]

## 2020-12-02 NOTE — Progress Notes (Signed)
   SUBJECTIVE:   CHIEF COMPLAINT / HPI:   Chief Complaint  Patient presents with  . Form Completion     Laura Burnett is a 55 y.o. female here to discuss form completion. Patient would like disability tax exemption form to be completed. Patient also complains of nasal congestion. Pt recently treated with antibiotics (augmentin) at an urgent care presumed sinus infection without relief. She completed 10 treatment of augmentin 875 mg BID for 10 days. No one else has similar sx. Denies fever, new shortness of breath, body aches. Endorses headaches. She has severe OSA.  Taking Flonase and Zyrtec for seasonal allergies. She is influenza and COVID vaccinated. She denies dental problems.    PERTINENT  PMH / PSH: reviewed and updated as appropriate   OBJECTIVE:   BP 114/70   Pulse 65   Ht 5\' 1"  (1.549 m)   Wt 202 lb 2 oz (91.7 kg)   SpO2 97%   BMI 38.19 kg/m    GEN: pleasant well appearing female, in no acute distress  HENT: moist mucus membranes, no rhinorrhea, boggy turbinates NECK: thyromegaly apparent, normal ROM CV: regular rate and rhythm, well perfused RESP: no increased work of breathing, clear to ascultation bilaterally MSK: no ambulatory assistive devices present  SKIN: warm, dry NEURO: alert and oriented, moves all extremities appropriately    ASSESSMENT/PLAN:   Encounter for completion of form with patient Pt provided court documents that state she is disabled. Copies made for the chart. Tax exemption form completed.   Seasonal allergies Continue Flonase. Discontinue Zyrtec and start Xyzal 5 mg daily. Encouraged daily use of Flonase and Xyzal. Avoid long-term use of nasal decongestants.  Discussed tachyphylaxis. COVID tested today as congestion is ongoing though most likely related to seasonal allergies.      Lyndee Hensen, DO PGY-2, [facility] 12/02/2020

## 2020-12-03 LAB — NOVEL CORONAVIRUS, NAA: SARS-CoV-2, NAA: NOT DETECTED

## 2020-12-03 LAB — SARS-COV-2, NAA 2 DAY TAT

## 2020-12-04 DIAGNOSIS — Z0289 Encounter for other administrative examinations: Secondary | ICD-10-CM | POA: Insufficient documentation

## 2020-12-04 DIAGNOSIS — R0981 Nasal congestion: Secondary | ICD-10-CM | POA: Insufficient documentation

## 2020-12-04 NOTE — Assessment & Plan Note (Signed)
Pt provided court documents that state she is disabled. Copies made for the chart. Tax exemption form completed.

## 2020-12-04 NOTE — Assessment & Plan Note (Addendum)
Continue Flonase. Discontinue Zyrtec and start Xyzal 5 mg daily. Encouraged daily use of Flonase and Xyzal. Avoid long-term use of nasal decongestants.  Discussed tachyphylaxis. COVID tested today as congestion is ongoing though most likely related to seasonal allergies.
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
## 2020-12-09 DIAGNOSIS — S134XXA Sprain of ligaments of cervical spine, initial encounter: Secondary | ICD-10-CM | POA: Diagnosis not present

## 2020-12-09 DIAGNOSIS — M9903 Segmental and somatic dysfunction of lumbar region: Secondary | ICD-10-CM | POA: Diagnosis not present

## 2020-12-09 DIAGNOSIS — M9902 Segmental and somatic dysfunction of thoracic region: Secondary | ICD-10-CM | POA: Diagnosis not present

## 2020-12-09 DIAGNOSIS — S233XXA Sprain of ligaments of thoracic spine, initial encounter: Secondary | ICD-10-CM | POA: Diagnosis not present

## 2020-12-09 DIAGNOSIS — M47816 Spondylosis without myelopathy or radiculopathy, lumbar region: Secondary | ICD-10-CM | POA: Diagnosis not present

## 2020-12-09 DIAGNOSIS — S338XXA Sprain of other parts of lumbar spine and pelvis, initial encounter: Secondary | ICD-10-CM | POA: Diagnosis not present

## 2020-12-09 DIAGNOSIS — M9901 Segmental and somatic dysfunction of cervical region: Secondary | ICD-10-CM | POA: Diagnosis not present

## 2020-12-12 ENCOUNTER — Telehealth (INDEPENDENT_AMBULATORY_CARE_PROVIDER_SITE_OTHER): Payer: Medicare HMO | Admitting: Psychiatry

## 2020-12-12 ENCOUNTER — Encounter: Payer: Self-pay | Admitting: Psychiatry

## 2020-12-12 ENCOUNTER — Other Ambulatory Visit: Payer: Self-pay

## 2020-12-12 DIAGNOSIS — F330 Major depressive disorder, recurrent, mild: Secondary | ICD-10-CM

## 2020-12-12 DIAGNOSIS — F411 Generalized anxiety disorder: Secondary | ICD-10-CM

## 2020-12-12 MED ORDER — DIAZEPAM 2 MG PO TABS: 1.0000 mg | ORAL_TABLET | Freq: Every day | ORAL | 0 refills | Status: AC | PRN

## 2020-12-12 MED ORDER — BUSPIRONE HCL 5 MG PO TABS: 5.0000 mg | ORAL_TABLET | Freq: Every day | ORAL | 0 refills | Status: DC

## 2020-12-24 ENCOUNTER — Ambulatory Visit (INDEPENDENT_AMBULATORY_CARE_PROVIDER_SITE_OTHER): Payer: Medicare HMO

## 2020-12-24 VITALS — Ht 61.0 in | Wt 200.0 lb

## 2020-12-24 DIAGNOSIS — Z0000 Encounter for general adult medical examination without abnormal findings: Secondary | ICD-10-CM | POA: Diagnosis not present

## 2020-12-24 NOTE — Patient Instructions (Addendum)
You spoke to Dorna Bloom, [place] over the phone for your annual wellness visit.  We discussed goals: Goals    . Exercise 3x per week (30 min per time)      We also discussed recommended health maintenance. As discussed, you are due for the following.   Health Maintenance  Topic Date Due  . Hepatitis C Screening  Never done  . HIV Screening  Never done  . TETANUS/TDAP  Never done  . INFLUENZA VACCINE  03/31/2021  . MAMMOGRAM  05/02/2021  . COLONOSCOPY (Pts 45-45yrs Insurance coverage will need to be confirmed)  09/05/2024  . COVID-19 Vaccine  Completed  . HPV VACCINES  Aged Out  . PAP SMEAR-Modifier  Discontinued   Start your workout videos! You are eligible for #2 booster vaccine.  We have them here at [facility]. Follow up with PCP soon.  I have put in a refill for your flexeril.    Health Maintenance, Female Adopting a healthy lifestyle and getting preventive care are important in promoting health and wellness. Ask your health care provider about:  The right schedule for you to have regular tests and exams.  Things you can do on your own to prevent diseases and keep yourself healthy. What should I know about diet, weight, and exercise? Eat a healthy diet  Eat a diet that includes plenty of vegetables, fruits, low-fat dairy products, and lean protein.  Do not eat a lot of foods that are high in solid fats, added sugars, or sodium.   Maintain a healthy weight Body mass index (BMI) is used to identify weight problems. It estimates body fat based on height and weight. Your health care provider can help determine your BMI and help you achieve or maintain a healthy weight. Get regular exercise Get regular exercise. This is one of the most important things you can do for your health. Most adults should:  Exercise for at least 150 minutes each week. The exercise should increase your heart rate and make you sweat (moderate-intensity exercise).  Do strengthening exercises at least twice  a week. This is in addition to the moderate-intensity exercise.  Spend less time sitting. Even light physical activity can be beneficial. Watch cholesterol and blood lipids Have your blood tested for lipids and cholesterol at 57 years of age, then have this test every 5 years. Have your cholesterol levels checked more often if:  Your lipid or cholesterol levels are high.  You are older than 57 years of age.  You are at high risk for heart disease. What should I know about cancer screening? Depending on your health history and family history, you may need to have cancer screening at various ages. This may include screening for:  Breast cancer.  Cervical cancer.  Colorectal cancer.  Skin cancer.  Lung cancer. What should I know about heart disease, diabetes, and high blood pressure? Blood pressure and heart disease  High blood pressure causes heart disease and increases the risk of stroke. This is more likely to develop in people who have high blood pressure readings, are of African descent, or are overweight.  Have your blood pressure checked: ? Every 3-5 years if you are 30-32 years of age. ? Every year if you are 89 years old or older. Diabetes Have regular diabetes screenings. This checks your fasting blood sugar level. Have the screening done:  Once every three years after age 31 if you are at a normal weight and have a low risk for diabetes.  More often and at a younger age if you are overweight or have a high risk for diabetes. What should I know about preventing infection? Hepatitis B If you have a higher risk for hepatitis B, you should be screened for this virus. Talk with your health care provider to find out if you are at risk for hepatitis B infection. Hepatitis C Testing is recommended for:  Everyone born from [birth year] through 1967.  Anyone with known risk factors for hepatitis C. Sexually transmitted infections (STIs)  Get screened for STIs, including  gonorrhea and chlamydia, if: ? You are sexually active and are younger than 57 years of age. ? You are older than 57 years of age and your health care provider tells you that you are at risk for this type of infection. ? Your sexual activity has changed since you were last screened, and you are at increased risk for chlamydia or gonorrhea. Ask your health care provider if you are at risk.  Ask your health care provider about whether you are at high risk for HIV. Your health care provider may recommend a prescription medicine to help prevent HIV infection. If you choose to take medicine to prevent HIV, you should first get tested for HIV. You should then be tested every 3 months for as long as you are taking the medicine. Pregnancy  If you are about to stop having your period (premenopausal) and you may become pregnant, seek counseling before you get pregnant.  Take 400 to 800 micrograms (mcg) of folic acid every day if you become pregnant.  Ask for birth control (contraception) if you want to prevent pregnancy. Osteoporosis and menopause Osteoporosis is a disease in which the bones lose minerals and strength with aging. This can result in bone fractures. If you are 31 years old or older, or if you are at risk for osteoporosis and fractures, ask your health care provider if you should:  Be screened for bone loss.  Take a calcium or vitamin D supplement to lower your risk of fractures.  Be given hormone replacement therapy (HRT) to treat symptoms of menopause. Follow these instructions at home: Lifestyle  Do not use any products that contain nicotine or tobacco, such as cigarettes, e-cigarettes, and chewing tobacco. If you need help quitting, ask your health care provider.  Do not use street drugs.  Do not share needles.  Ask your health care provider for help if you need support or information about quitting drugs. Alcohol use  Do not drink alcohol if: ? Your health care provider  tells you not to drink. ? You are pregnant, may be pregnant, or are planning to become pregnant.  If you drink alcohol: ? Limit how much you use to 0-1 drink a day. ? Limit intake if you are breastfeeding.  Be aware of how much alcohol is in your drink. In the [place] one drink equals one 12 oz bottle of beer (355 mL), one 5 oz glass of wine (148 mL), or one 1 oz glass of hard liquor (44 mL). General instructions  Schedule regular health, dental, and eye exams.  Stay current with your vaccines.  Tell your health care provider if: ? You often feel depressed. ? You have ever been abused or do not feel safe at home. Summary  Adopting a healthy lifestyle and getting preventive care are important in promoting health and wellness.  Follow your health care provider's instructions about healthy diet, exercising, and getting tested or screened for diseases.  Follow  your health care provider's instructions on monitoring your cholesterol and blood pressure. This information is not intended to replace advice given to you by your health care provider. Make sure you discuss any questions you have with your health care provider. Document Revised: 08/10/2018 Document Reviewed: 08/10/2018 Elsevier Patient Education  2021 [employer].  Here is an example of what a healthy plate looks like:    ? Make half your plate fruits and vegetables.     ? Focus on whole fruits.     ? Vary your veggies.  ? Make half your grains whole grains. -     ? Look for the word "whole" at the beginning of the ingredients list    ? Some whole-grain ingredients include whole oats, whole-wheat flour,        whole-grain corn, whole-grain brown rice, and whole rye.  ? Move to low-fat and fat-free milk or yogurt.  ? Vary your protein routine. - Meat, fish, poultry (chicken, [place]), eggs, beans (kidney, pinto), dairy.  ? Drink and eat less sodium, saturated fat, and added sugars.   Our clinic's number is  [phone]. Please call with questions or concerns about what we discussed today.

## 2020-12-24 NOTE — Progress Notes (Addendum)
Subjective:   Laura Burnett is a 55 y.o. female who presents for Medicare Annual (Subsequent) preventive examination.  Patient consented to have virtual visit and was identified by name and date of birth. Method of visit: Telephone  Encounter participants: Patient: Laura Burnett - located at Home Nurse/Provider: Dorna Bloom - located at [facility] Others (if applicable): NA  Review of Systems: Defer to PCP.  Cardiac Risk Factors include: sedentary lifestyle;obesity (BMI >30kg/m2)  Objective:   Vitals: Ht 5\' 1"  (1.549 m)   Wt 200 lb (90.7 kg)   BMI 37.79 kg/m   Body mass index is 37.79 kg/m.  Advanced Directives 12/24/2020 12/02/2020 10/11/2020 07/16/2020 04/30/2020 01/11/2020 12/04/2019  Does Patient Have a Medical Advance Directive? No No No No No No No  Would patient like information on creating a medical advance directive? No - Patient declined No - Patient declined No - Patient declined No - Patient declined No - Patient declined No - Patient declined No - Patient declined   Tobacco Social History   Tobacco Use  Smoking Status Former Smoker  Smokeless Tobacco Never Used  Tobacco Comment   stopped smoking cigarettes at age 51     Counseling given: No plans to restart.  Clinical Intake:  Pre-visit preparation completed: Yes  Pain Score: 6   How often do you need to have someone help you when you read instructions, pamphlets, or other written materials from your doctor or pharmacy?: 2 - Rarely What is the last grade level you completed in school?: High School  Interpreter Needed?: No  Past Medical History:  Diagnosis Date  . Allergy    seasonal allergies/animals  . Anxiety   . Arthritis   . Asthma    as a child " exercise induced"  . Bronchitis   . Constipation   . Depression   . DJD (degenerative joint disease)   . Fibromyalgia   . GERD (gastroesophageal reflux disease)   . Headache   . Hypothyroidism   . Obesity   . PONV (postoperative nausea and vomiting)    . Wears glasses    Past Surgical History:  Procedure Laterality Date  . ABDOMINAL HYSTERECTOMY    . ADENOIDECTOMY    . APPLICATION OF ROBOTIC ASSISTANCE FOR SPINAL PROCEDURE N/A 12/21/2016   Procedure: APPLICATION OF ROBOTIC ASSISTANCE FOR SPINAL PROCEDURE;  Surgeon: Kevan Ny Ditty, MD;  Location: [place];  Service: Neurosurgery;  Laterality: N/A;  . BACK SURGERY     L5-S1  . CARPAL TUNNEL RELEASE Right 01/16/2019   Procedure: RIGHT CARPAL TUNNEL RELEASE;  Surgeon: [name], MD;  Location: [place];  Service: Orthopedics;  Laterality: Right;  . COLONOSCOPY    . CYST EXCISION     uterine ;subsequent sx followed  . FOOT SURGERY    . HAND SURGERY     from [facility]  . KNEE ARTHROSCOPY     x2  . TONSILLECTOMY    . TRIGGER FINGER RELEASE Right 01/16/2019   Procedure: RIGHT TRIGGER THUMB RELEASE;  Surgeon: [name], MD;  Location: [place];  Service: Orthopedics;  Laterality: Right;  . TRIGGER FINGER RELEASE Right 12/04/2019   Procedure: right index trigger finger release;  Surgeon: [name], MD;  Location: [place];  Service: Orthopedics;  Laterality: Right;  . UPPER GASTROINTESTINAL ENDOSCOPY     Family History  Problem Relation Age of Onset  . Thyroid disease Sister   . Thyroid disease Brother   .  Stomach cancer Brother   . Alzheimer's disease Mother   . Diabetes Mother   . Heart attack Father   . Colon cancer Father   . Heart failure Father   . Cancer Brother   . Cancer Brother   . Esophageal cancer Neg Hx   . Rectal cancer Neg Hx    Social History   Socioeconomic History  . Marital status: Divorced    Spouse name: Not on file  . Number of children: 1  . Years of education: 43  . Highest education level: High school graduate  Occupational History  . Occupation: Disabled   Tobacco Use  . Smoking status: Former Research scientist (life sciences)  . Smokeless tobacco: Never Used  . Tobacco comment: stopped smoking cigarettes at age 34   Vaping Use  . Vaping Use: Never used  Substance and Sexual Activity  . Alcohol use: Yes    Comment: occasional beer  . Drug use: No    Comment:  per pt when she was 16 year  . Sexual activity: Not Currently  Other Topics Concern  . Not on file  Social History Narrative   Patient lives alone in [place], [place].   Patient has one son who lives nearby.    Patient has 2 grandchildren.    Patient enjoys sewing, being outside in her garden, and spending time with family.    Social Determinants of Health   Financial Resource Strain: Low Risk   . Difficulty of Paying Living Expenses: Not hard at all  Food Insecurity: No Food Insecurity  . Worried About Charity fundraiser in the Last Year: Never true  . Ran Out of Food in the Last Year: Never true  Transportation Needs: No Transportation Needs  . Lack of Transportation (Medical): No  . Lack of Transportation (Non-Medical): No  Physical Activity: Inactive  . Days of Exercise per Week: 0 days  . Minutes of Exercise per Session: 0 min  Stress: Stress Concern Present  . Feeling of Stress : Rather much  Social Connections: Socially Isolated  . Frequency of Communication with Friends and Family: More than three times a week  . Frequency of Social Gatherings with Friends and Family: More than three times a week  . Attends Religious Services: Never  . Active Member of Clubs or Organizations: No  . Attends Archivist Meetings: Never  . Marital Status: Divorced   Outpatient Encounter Medications as of 12/24/2020  Medication Sig  . albuterol (VENTOLIN HFA) 108 (90 Base) MCG/ACT inhaler INHALE 2 PUFFS BY MOUTH EVERY 4 HOURS AS NEEDED FOR WHEEZING AND FOR SHORTNESS OF BREATH  . atorvastatin (LIPITOR) 40 MG tablet Take 1 tablet (40 mg total) by mouth daily.  . busPIRone (BUSPAR) 5 MG tablet Take 1 tablet (5 mg total) by mouth daily.  . cyclobenzaprine (FLEXERIL) 10 MG tablet Take 1 tablet by mouth three times daily as needed for muscle  spasm  . diazepam (VALIUM) 2 MG tablet Take 1 tablet (2 mg total) by mouth daily as needed for anxiety.  Derrill Memo ON 12/26/2020] diazepam (VALIUM) 2 MG tablet Take 0.5 tablets (1 mg total) by mouth daily as needed for anxiety.  . docusate sodium (COLACE) 100 MG capsule Take 100 mg by mouth 2 (two) times daily.  . DULoxetine (CYMBALTA) 60 MG capsule Take 2 capsules (120 mg total) by mouth daily.  . EUTHYROX 100 MCG tablet Take 1 tablet by mouth once daily  . fluticasone (FLONASE) 50 MCG/ACT nasal spray Place 2  sprays into both nostrils daily.  Marland Kitchen gabapentin (NEURONTIN) 800 MG tablet TAKE 1 TABLET BY MOUTH THREE TIMES DAILY  . ibuprofen (ADVIL,MOTRIN) 200 MG tablet Take 600 mg by mouth every 8 (eight) hours as needed for headache or moderate pain.   Marland Kitchen levocetirizine (XYZAL) 5 MG tablet Take 1 tablet (5 mg total) by mouth every evening.  . nortriptyline (PAMELOR) 50 MG capsule Take 1 capsule (50 mg total) by mouth at bedtime.  Marland Kitchen omeprazole (PRILOSEC) 40 MG capsule Take 1 capsule by mouth twice daily  . sodium chloride HYPERTONIC 3 % nebulizer solution Take by nebulization daily. (Patient not taking: Reported on 12/24/2020)   No facility-administered encounter medications on file as of 12/24/2020.   Activities of Daily Living In your present state of health, do you have any difficulty performing the following activities: 12/24/2020  Hearing? N  Vision? N  Difficulty concentrating or making decisions? N  Walking or climbing stairs? N  Dressing or bathing? N  Doing errands, shopping? N  Preparing Food and eating ? N  Using the Toilet? N  In the past six months, have you accidently leaked urine? N  Do you have problems with loss of bowel control? N  Managing your Medications? N  Managing your Finances? N  Housekeeping or managing your Housekeeping? N  Some recent data might be hidden   Patient Care Team: Delora Fuel, MD as PCP - General (Family Medicine)    Assessment:   This is a routine  wellness examination for [place].  Exercise Activities and Dietary recommendations Current Exercise Habits: The patient does not participate in regular exercise at present, Exercise limited by: orthopedic condition(s)  Goals    . Exercise 3x per week (30 min per time)      Fall Risk Fall Risk  12/24/2020 01/25/2020 01/11/2020 10/11/2019 06/14/2019  Falls in the past year? 0 0 1 0 1  Number falls in past yr: - 0 0 0 0  Injury with Fall? - 0 1 - 0   Is the patient's home free of loose throw rugs in walkways, pet beds, electrical cords, etc?   yes      Grab bars in the bathroom? yes      Handrails on the stairs?   yes      Adequate lighting?   yes  Patient rating of health (0-10) scale: 7  Depression Screen PHQ 2/9 Scores 12/24/2020 12/02/2020 10/11/2020 07/16/2020  PHQ - 2 Score 3 3 1 2   PHQ- 9 Score 8 8 6 6   Exception Documentation - - - -  Not completed - - - -  Some encounter information is confidential and restricted. Go to Review Flowsheets activity to see all data.    Cognitive Function  6CIT Screen 12/24/2020  What Year? 0 points  What month? 0 points  What time? 0 points  Count back from 20 0 points  Months in reverse 0 points  Repeat phrase 0 points  Total Score 0   Immunization History  Administered Date(s) Administered  . Influenza,inj,Quad PF,6+ Mos 07/06/2018, 05/09/2019, 06/04/2020  . Moderna Sars-Covid-2 Vaccination 11/23/2019, 12/21/2019, 07/08/2020   Screening Tests Health Maintenance  Topic Date Due  . Hepatitis C Screening  Never done  . HIV Screening  Never done  . TETANUS/TDAP  Never done  . INFLUENZA VACCINE  03/31/2021  . MAMMOGRAM  05/02/2021  . COLONOSCOPY (Pts 45-60yrs Insurance coverage will need to be confirmed)  09/05/2024  . COVID-19 Vaccine  Completed  . HPV  VACCINES  Aged Out  . PAP SMEAR-Modifier  Discontinued   Cancer Screenings: Lung: Low Dose CT Chest recommended if Age 100-80 years, 30 pack-year currently smoking OR have quit w/in  15years. Patient does not qualify. Breast:  Up to date on Mammogram? Yes   Up to date of Bone Density/Dexa? NA Colorectal: Due 2026  Additional Screenings: Pap Smear: Hysterectomy   Plan:  Start your workout videos! You are eligible for #2 booster vaccine.  We have them here at [facility]. Follow up with PCP soon.  I have put in a refill for your flexeril.  I have personally reviewed and noted the following in the patient's chart:   . Medical and social history . Use of alcohol, tobacco or illicit drugs  . Current medications and supplements . Functional ability and status . Nutritional status . Physical activity . Advanced directives . List of other physicians . Hospitalizations, surgeries, and ER visits in previous 12 months . Vitals . Screenings to include cognitive, depression, and falls . Referrals and appointments  In addition, I have reviewed and discussed with patient certain preventive protocols, quality metrics, and best practice recommendations. A written personalized care plan for preventive services as well as general preventive health recommendations were provided to patient.  This visit was conducted virtually in the setting of the [place] pandemic.    Dorna Bloom, [place]  12/24/2020    I have reviewed this visit and agree with the documentation.  Delora Fuel, MD PGY-1

## 2020-12-26 ENCOUNTER — Other Ambulatory Visit: Payer: Self-pay | Admitting: Family Medicine

## 2020-12-27 ENCOUNTER — Other Ambulatory Visit: Payer: Self-pay | Admitting: Family Medicine

## 2020-12-29 DIAGNOSIS — G4733 Obstructive sleep apnea (adult) (pediatric): Secondary | ICD-10-CM | POA: Diagnosis not present

## 2020-12-29 DIAGNOSIS — G4710 Hypersomnia, unspecified: Secondary | ICD-10-CM | POA: Diagnosis not present
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
## 2021-01-08 ENCOUNTER — Ambulatory Visit (INDEPENDENT_AMBULATORY_CARE_PROVIDER_SITE_OTHER): Payer: Medicare HMO | Admitting: Family Medicine

## 2021-01-08 ENCOUNTER — Other Ambulatory Visit: Payer: Self-pay

## 2021-01-08 ENCOUNTER — Encounter: Payer: Self-pay | Admitting: Family Medicine

## 2021-01-08 VITALS — BP 112/68 | HR 94 | Ht 61.0 in | Wt 204.0 lb

## 2021-01-08 DIAGNOSIS — R0989 Other specified symptoms and signs involving the circulatory and respiratory systems: Secondary | ICD-10-CM

## 2021-01-08 DIAGNOSIS — G4733 Obstructive sleep apnea (adult) (pediatric): Secondary | ICD-10-CM

## 2021-01-08 DIAGNOSIS — E010 Iodine-deficiency related diffuse (endemic) goiter: Secondary | ICD-10-CM | POA: Diagnosis not present

## 2021-01-08 DIAGNOSIS — R1319 Other dysphagia: Secondary | ICD-10-CM

## 2021-01-08 DIAGNOSIS — R198 Other specified symptoms and signs involving the digestive system and abdomen: Secondary | ICD-10-CM | POA: Diagnosis not present

## 2021-01-08 NOTE — Patient Instructions (Signed)
It was great seeing you today!   Be sure to follow up with ENT. You may be able to have a scope to further evaluate your thyroid and swallowing difficulty.    If you have questions or concerns please do not hesitate to call at [phone].  Dr. Rushie [facility]

## 2021-01-08 NOTE — Progress Notes (Signed)
   SUBJECTIVE:   CHIEF COMPLAINT / HPI:   Chief Complaint  Patient presents with  . Follow-up    Thyroid     Laura Burnett is a 55 y.o. female here for thyromegaly follow up. Patient's labs and recent ultrasound normal. She reports her thyroid has increased in size on the right side. Reports feeling like she has something stuck in her throat. Takes allergy medication.  She is unable to tolerate CPAP at bedtime due to difficulty breathing. She believes this is due to something compressing her airway. Has persistent nasal congestion and intermittent cough.   PERTINENT  PMH / PSH: reviewed and updated as appropriate   OBJECTIVE:   BP 112/68   Pulse 94   Ht 5\' 1"  (1.549 m)   Wt 204 lb (92.5 kg)   SpO2 97%   BMI 38.55 kg/m    GEN: pleasant well appearing female, in no acute distress  NECK: normal ROM, mild thyromegaly, no palpable nodules, supple, no lymphadenopathy  CV: regular rate and rhythm RESP: no increased work of breathing, clear to ascultation bilaterally SKIN: warm, dry    ASSESSMENT/PLAN:   OSA (obstructive sleep apnea) Advised pt to follow up with pulmonologist and call sleep center to schedule an appointment for titration.   Thyromegaly Reviewed previous normal TSH and thyroid ultrasound which were about 3 months ago. Will not repeat today. Previously followed with ENT Dr. Gavin Pound. Pt would like to see new ENT provider to surgical evaluation. Referral placed.   Globus sensation ?dysphagia vs airway obstruction secondary to enlarged thyroid. No change in drinking liquids or solids.  ENT referral sent. Possibly lingual tonsil hypertrophy. Continue Xyzal and Flonase. No respiratory distress today.      Lyndee Hensen, DO PGY-2, [facility] 01/08/2021

## 2021-01-09 ENCOUNTER — Encounter: Payer: Self-pay | Admitting: Psychiatry

## 2021-01-09 ENCOUNTER — Telehealth (INDEPENDENT_AMBULATORY_CARE_PROVIDER_SITE_OTHER): Payer: Medicare HMO | Admitting: Psychiatry

## 2021-01-09 DIAGNOSIS — F330 Major depressive disorder, recurrent, mild: Secondary | ICD-10-CM | POA: Diagnosis not present

## 2021-01-09 DIAGNOSIS — S338XXA Sprain of other parts of lumbar spine and pelvis, initial encounter: Secondary | ICD-10-CM | POA: Diagnosis not present

## 2021-01-09 DIAGNOSIS — F411 Generalized anxiety disorder: Secondary | ICD-10-CM | POA: Diagnosis not present

## 2021-01-09 DIAGNOSIS — S233XXA Sprain of ligaments of thoracic spine, initial encounter: Secondary | ICD-10-CM | POA: Diagnosis not present

## 2021-01-09 DIAGNOSIS — M9902 Segmental and somatic dysfunction of thoracic region: Secondary | ICD-10-CM | POA: Diagnosis not present

## 2021-01-09 DIAGNOSIS — M9901 Segmental and somatic dysfunction of cervical region: Secondary | ICD-10-CM | POA: Diagnosis not present

## 2021-01-09 DIAGNOSIS — M9903 Segmental and somatic dysfunction of lumbar region: Secondary | ICD-10-CM | POA: Diagnosis not present

## 2021-01-09 DIAGNOSIS — M47816 Spondylosis without myelopathy or radiculopathy, lumbar region: Secondary | ICD-10-CM | POA: Diagnosis not present

## 2021-01-09 DIAGNOSIS — S134XXA Sprain of ligaments of cervical spine, initial encounter: Secondary | ICD-10-CM | POA: Diagnosis not present

## 2021-01-09 MED ORDER — DULOXETINE HCL 30 MG PO CPEP: 30.0000 mg | ORAL_CAPSULE | Freq: Every day | ORAL | 0 refills | Status: DC

## 2021-01-09 MED ORDER — NORTRIPTYLINE HCL 50 MG PO CAPS: 50.0000 mg | ORAL_CAPSULE | Freq: Every day | ORAL | 0 refills | Status: DC

## 2021-01-09 MED ORDER — VENLAFAXINE HCL ER 37.5 MG PO CP24: ORAL_CAPSULE | ORAL | 0 refills | Status: DC

## 2021-01-09 MED ORDER — VENLAFAXINE HCL ER 150 MG PO CP24: ORAL_CAPSULE | ORAL | 0 refills | Status: DC

## 2021-01-09 NOTE — Patient Instructions (Signed)
1. Start venlafaxine 37.5 mg daily for one week, then 75 mg daily for one week, then 150 mg daily  2. Decrease duloxetine 60 mg daily for one week, then 30 mg daily for one week, then discontinue 3. Continuenortriptyline 50mg  at night 4. Discontinue buspar 5. Continue valium 1 mg daily as needed for anxiety 6.Next appointment: 6/24 at 9:30

## 2021-01-10 DIAGNOSIS — R0989 Other specified symptoms and signs involving the circulatory and respiratory systems: Secondary | ICD-10-CM | POA: Insufficient documentation

## 2021-01-10 NOTE — Assessment & Plan Note (Signed)
Advised pt to follow up with pulmonologist and call sleep center to schedule an appointment for titration.

## 2021-01-10 NOTE — Assessment & Plan Note (Signed)
Reviewed previous normal TSH and thyroid ultrasound which were about 3 months ago. Will not repeat today. Previously followed with ENT Dr. Gavin Pound. Pt would like to see new ENT provider to surgical evaluation. Referral placed.

## 2021-01-10 NOTE — Assessment & Plan Note (Signed)
?  dysphagia vs airway obstruction secondary to enlarged thyroid. No change in drinking liquids or solids.  ENT referral sent. Possibly lingual tonsil hypertrophy. Continue Xyzal and Flonase. No respiratory distress today.

## 2021-01-21 ENCOUNTER — Other Ambulatory Visit: Payer: Self-pay | Admitting: Family Medicine

## 2021-02-10 ENCOUNTER — Other Ambulatory Visit: Payer: Self-pay

## 2021-02-10 ENCOUNTER — Ambulatory Visit (INDEPENDENT_AMBULATORY_CARE_PROVIDER_SITE_OTHER): Payer: Medicare Other | Admitting: Otolaryngology

## 2021-02-10 VITALS — Temp 97.5°F

## 2021-02-10 DIAGNOSIS — E049 Nontoxic goiter, unspecified: Secondary | ICD-10-CM

## 2021-02-10 DIAGNOSIS — K219 Gastro-esophageal reflux disease without esophagitis: Secondary | ICD-10-CM

## 2021-02-10 DIAGNOSIS — R1314 Dysphagia, pharyngoesophageal phase: Secondary | ICD-10-CM

## 2021-02-10 NOTE — Progress Notes (Signed)
HPI: Laura Burnett is a 55 y.o. female who presents is referred by Talbert Cage, MD for evaluation of enlarged thyroid.  She apparently has had an ultrasound of her thyroid performed in 2019 as well as 2020 and more recently in March 2022.  These all demonstrated mild thyroid enlargement with no discrete nodules or masses warranting biopsy. She describes intermittent pain in her right neck as well as swelling in the right neck that radiates up into her right jaw.  She also complains of chronic dry throat and sometimes difficulty swallowing and thought it might be related to her thyroid.  She sometimes loses her voice and complains of occasional sore throat. She also has a diagnosis of fibromyalgia. She had a history of upper GI endoscopy performed about a year ago and was diagnosed with gastritis and reflux disease and was placed on omeprazole twice daily. She has had a long history of diagnosis of hypothyroidism and has been on thyroid hormone for a number of years..  Past Medical History:  Diagnosis Date   Allergy    seasonal allergies/animals   Anxiety    Arthritis    Asthma    as a child " exercise induced"   Bronchitis    Constipation    Depression    DJD (degenerative joint disease)    Fibromyalgia    GERD (gastroesophageal reflux disease)    Headache    Hypothyroidism    Obesity    PONV (postoperative nausea and vomiting)    Wears glasses    Past Surgical History:  Procedure Laterality Date   ABDOMINAL HYSTERECTOMY     ADENOIDECTOMY     APPLICATION OF ROBOTIC ASSISTANCE FOR SPINAL PROCEDURE N/A 12/21/2016   Procedure: APPLICATION OF ROBOTIC ASSISTANCE FOR SPINAL PROCEDURE;  Surgeon: Kevan Ny Ditty, MD;  Location: [facility];  Service: Neurosurgery;  Laterality: N/A;   BACK SURGERY     L5-S1   CARPAL TUNNEL RELEASE Right 01/16/2019   Procedure: RIGHT CARPAL TUNNEL RELEASE;  Surgeon: [name], MD;  Location: [facility];  Service: Orthopedics;   Laterality: Right;   COLONOSCOPY     CYST EXCISION     uterine ;subsequent sx followed   FOOT SURGERY     HAND SURGERY     from AA   KNEE ARTHROSCOPY     x2   TONSILLECTOMY     TRIGGER FINGER RELEASE Right 01/16/2019   Procedure: RIGHT TRIGGER THUMB RELEASE;  Surgeon: [name], MD;  Location: [facility];  Service: Orthopedics;  Laterality: Right;   TRIGGER FINGER RELEASE Right 12/04/2019   Procedure: right index trigger finger release;  Surgeon: [name], MD;  Location: [facility];  Service: Orthopedics;  Laterality: Right;   UPPER GASTROINTESTINAL ENDOSCOPY     Social History   Socioeconomic History   Marital status: Divorced    Spouse name: Not on file   Number of children: 1   Years of education: 12   Highest education level: High school graduate  Occupational History   Occupation: Disabled   Tobacco Use   Smoking status: Former    Pack years: 0.00   Smokeless tobacco: Never   Tobacco comments:    stopped smoking cigarettes at age 87  Vaping Use   Vaping Use: Never used  Substance and Sexual Activity   Alcohol use: Yes    Comment: occasional beer   Drug use: No    Comment:  per pt when she  was 16 year   Sexual activity: Not Currently  Other Topics Concern   Not on file  Social History Narrative   Patient lives alone in [place], [place].   Patient has one son who lives nearby.    Patient has 2 grandchildren.    Patient enjoys sewing, being outside in her garden, and spending time with family.    Social Determinants of Health   Financial Resource Strain: Low Risk    Difficulty of Paying Living Expenses: Not hard at all  Food Insecurity: No Food Insecurity   Worried About Charity fundraiser in the Last Year: Never true   [place] in the Last Year: Never true  Transportation Needs: No Transportation Needs   Lack of Transportation (Medical): No   Lack of Transportation (Non-Medical): No  Physical Activity: Inactive   Days  of Exercise per Week: 0 days   Minutes of Exercise per Session: 0 min  Stress: Stress Concern Present   Feeling of Stress : Rather much  Social Connections: Socially Isolated   Frequency of Communication with Friends and Family: More than three times a week   Frequency of Social Gatherings with Friends and Family: More than three times a week   Attends Religious Services: Never   Marine scientist or Organizations: No   Attends Music therapist: Never   Marital Status: Divorced   Family History  Problem Relation Age of Onset   Thyroid disease Sister    Thyroid disease Brother    Stomach cancer Brother    Alzheimer's disease Mother    Diabetes Mother    Heart attack Father    Colon cancer Father    Heart failure Father    Cancer Brother    Cancer Brother    Esophageal cancer Neg Hx    Rectal cancer Neg Hx    Allergies  Allergen Reactions   Ciprofloxacin Nausea And Vomiting   Phentermine Other (See Comments)    Tearing, lightheadedness, depression   Tape Other (See Comments)    " I get red and itchy." Paper tape okay   Percocet [Oxycodone-Acetaminophen] Hives and Rash   Tetanus Toxoids Nausea And Vomiting   Tylenol With Codeine #3 [Acetaminophen-Codeine] Rash   Prior to Admission medications   Medication Sig Start Date End Date Taking? Authorizing Provider  albuterol (VENTOLIN HFA) 108 (90 Base) MCG/ACT inhaler INHALE 2 PUFFS BY MOUTH EVERY 4 HOURS AS NEEDED FOR WHEEZING AND FOR SHORTNESS OF BREATH 08/31/20   Maness, Arnette Norris, MD  atorvastatin (LIPITOR) 40 MG tablet Take 1 tablet (40 mg total) by mouth daily. 07/17/20   Brimage, Ronnette Juniper, DO  busPIRone (BUSPAR) 5 MG tablet Take 1 tablet (5 mg total) by mouth daily. 12/12/20   Norman Clay, MD  cyclobenzaprine (FLEXERIL) 10 MG tablet TAKE 1 TABLET BY MOUTH THREE TIMES DAILY AS NEEDED FOR MUSCLE SPASMS 12/27/20   Maness, Arnette Norris, MD  docusate sodium (COLACE) 100 MG capsule Take 100 mg by mouth 2 (two) times daily.     [provider]  DULoxetine (CYMBALTA) 30 MG capsule Take 1 capsule (30 mg total) by mouth daily. 01/09/21   Norman Clay, MD  EUTHYROX 100 MCG tablet Take 1 tablet by mouth once daily 11/20/20   Ganta, Anupa, DO  fluticasone (FLONASE) 50 MCG/ACT nasal spray Place 2 sprays into both nostrils daily. 11/29/17   Mikell, Jeani Sow, MD  gabapentin (NEURONTIN) 800 MG tablet TAKE 1 TABLET BY MOUTH THREE TIMES DAILY 01/21/21  Maness, Philip, MD  ibuprofen (ADVIL,MOTRIN) 200 MG tablet Take 600 mg by mouth every 8 (eight) hours as needed for headache or moderate pain.     [provider]  levocetirizine (XYZAL) 5 MG tablet Take 1 tablet (5 mg total) by mouth every evening. 12/02/20   Lyndee Hensen, DO  nortriptyline (PAMELOR) 50 MG capsule Take 1 capsule (50 mg total) by mouth at bedtime. 02/05/21   Norman Clay, MD  omeprazole (PRILOSEC) 40 MG capsule Take 1 capsule by mouth twice daily 02/12/20   Guadalupe Dawn, MD  venlafaxine XR (EFFEXOR-XR) 150 MG 24 hr capsule 150 mg daily. Start after completing 75 mg daily for one week 01/23/21   Norman Clay, MD  venlafaxine XR (EFFEXOR-XR) 37.5 MG 24 hr capsule 37.5 mg daily for one week, then 75 mg daily 01/09/21   Norman Clay, MD     Positive ROS: Otherwise negative  All other systems have been reviewed and were otherwise negative with the exception of those mentioned in the HPI and as above.  Physical Exam: Constitutional: Alert, well-appearing, no acute distress.  She has no hoarseness in the office today. Ears: External ears without lesions or tenderness. Ear canals are clear bilaterally with intact, clear TMs.  Nasal: External nose without lesions. Septum relatively midline with mild rhinitis.. Clear nasal passages otherwise. Oral: Lips and gums without lesions. Tongue and palate mucosa without lesions. Posterior oropharynx clear.  Tonsil regions appear benign bilaterally.  Indirect laryngoscopy revealed a clear base of tongue vallecula  epiglottis. Fiberoptic laryngoscopy was performed through the right nostril.  The nasopharynx was clear.  The base of tongue vallecula and epiglottis were normal.  The vocal cords were clear bilaterally with normal vocal mobility.  Piriform sinuses were clear.  She had mild arytenoid mucosal edema but no mucosal lesions noted.  The fiberoptic laryngoscope was passed through the upper esophageal sphincter without difficulty and the upper cervical esophagus was clear.  Although this was uncomfortable. Neck: No palpable adenopathy or masses.  Palpation of the thyroid region she has no obvious palpable goiter or palpable thyroid masses noted.  No supraclavicular adenopathy noted.  Palpation of the right neck where she states that she occasionally gets swelling reveals no swelling and no palpable masses. Respiratory: Breathing comfortably  Skin: No facial/neck lesions or rash noted.  Laryngoscopy  Date/Time: 02/10/2021 5:06 PM Performed by: Rozetta Nunnery, MD Authorized by: Rozetta Nunnery, MD   Consent:    Consent obtained:  Verbal   Consent given by:  Patient Procedure details:    Indications: direct visualization of the upper aerodigestive tract and hoarseness, dysphagia, or aspiration     Medication:  Afrin   Instrument: flexible fiberoptic laryngoscope     Scope location: right nare   Sinus:    Right nasopharynx: normal   Mouth:    Oropharynx: normal     Vallecula: normal     Base of tongue: normal     Epiglottis: normal   Throat:    Pyriform sinus: normal     True vocal cords: normal   Comments:     On fiberoptic laryngoscopy the nasopharynx, hypopharynx and larynx were normal to examination with no significant pathology noted.  Vocal cords were clear bilaterally with normal vocal cord mobility.  Mild arytenoid edema consistent with possible reflux disease.  Assessment: Chronic throat complaints questionable etiology.  I doubt that the thyroid is causing her symptoms.   She has had several ultrasounds of her thyroid that has shown  mild thyromegaly.  She apparently has had a long history of being hypothyroid and takes thyroid medication. She apparently does have history of GE reflux disease and this may be more of the contributing factor although she is taking omeprazole twice daily regularly. She reports history of hoarseness although she is not hoarse today with normal vocal cord examination.  Plan: Reviewed with her that I would initially make an appointment with endocrinology to see if perhaps increasing her thyroid hormone might lower her TSH which would perhaps decrease the size of the mild thyroid megaly.  If endocrinology feels that removal of the thyroid gland would be beneficial could consider surgical intervention although risk is involved with surgery. Recommended follow-up here if she develops any persistent hoarseness or persistent swelling of the right side of the neck for evaluation when this is occurring.   Radene Journey, MD   CC:

## 2021-02-17 ENCOUNTER — Telehealth: Payer: Self-pay | Admitting: Pulmonary Disease

## 2021-02-17 DIAGNOSIS — G4733 Obstructive sleep apnea (adult) (pediatric): Secondary | ICD-10-CM
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
## 2021-02-17 NOTE — Telephone Encounter (Signed)
Called patient but she did not answer. Left message for patient to call back.  

## 2021-02-17 NOTE — Telephone Encounter (Signed)
Pt states that she has a CPAP machine and also has had 3 masks but she is unable to wear the masks states it is causing her discomfort and she stated that it causes her to break out and she would like to know if there is an alternative that she can do. Pls regard; [phone]

## 2021-02-17 NOTE — Telephone Encounter (Signed)
Spoke with pt who states she has 2 full facemask which have broken her face out and a nasal pillow mask which she just tears off at night. Pt is wondering if Dr. Elsworth Soho has any recommendations for any other type of mask that might help her. Pt is also interested in exploring the oral appliance again. Dr. Elsworth Soho please advise.

## 2021-02-21 ENCOUNTER — Other Ambulatory Visit: Payer: Self-pay | Admitting: Family Medicine

## 2021-02-21 ENCOUNTER — Telehealth (INDEPENDENT_AMBULATORY_CARE_PROVIDER_SITE_OTHER): Payer: Medicare Other | Admitting: Psychiatry

## 2021-02-21 ENCOUNTER — Other Ambulatory Visit: Payer: Self-pay

## 2021-02-21 ENCOUNTER — Encounter: Payer: Self-pay | Admitting: Psychiatry

## 2021-02-21 DIAGNOSIS — F411 Generalized anxiety disorder: Secondary | ICD-10-CM

## 2021-02-21 DIAGNOSIS — F330 Major depressive disorder, recurrent, mild: Secondary | ICD-10-CM | POA: Diagnosis not present

## 2021-02-21 DIAGNOSIS — R1013 Epigastric pain: Secondary | ICD-10-CM

## 2021-02-21 MED ORDER — VENLAFAXINE HCL ER 75 MG PO CP24: ORAL_CAPSULE | ORAL | 1 refills | Status: DC

## 2021-02-21 MED ORDER — DIAZEPAM 2 MG PO TABS: 1.0000 mg | ORAL_TABLET | Freq: Every day | ORAL | 1 refills | Status: DC | PRN

## 2021-02-21 MED ORDER — VENLAFAXINE HCL ER 150 MG PO CP24: ORAL_CAPSULE | ORAL | 1 refills | Status: DC

## 2021-02-21 NOTE — Patient Instructions (Signed)
1. Increase venlafaxine 225  mg daily 2. Continue valium 1 mg daily as needed for anxiety 3. Next appointment: 8/12 at 10 AM

## 2021-02-24 ENCOUNTER — Other Ambulatory Visit: Payer: Self-pay

## 2021-02-24 MED ORDER — LEVOTHYROXINE SODIUM 100 MCG PO TABS: 100.0000 ug | ORAL_TABLET | Freq: Every day | ORAL | 0 refills | Status: DC

## 2021-02-27 NOTE — Telephone Encounter (Signed)
Called and spoke to pt. Informed her of the recs per Dr. Elsworth Soho. Rx sent to DME for mask. Pt verbalized understanding and denied any further questions or concerns at this time.

## 2021-03-05 ENCOUNTER — Telehealth: Payer: Self-pay | Admitting: *Deleted

## 2021-03-05 DIAGNOSIS — E039 Hypothyroidism, unspecified: Secondary | ICD-10-CM

## 2021-03-05 NOTE — Telephone Encounter (Signed)
Received voicemail from patient requesting a referral to the endocrinologist.  She states that this was suggested to her at the ENT appt before they performed any procedures on her.  Patient has seen Dr. Loanne Drilling in the past and was agreeable to returning back to him.  Will forward to MD. Laura Burnett

## 2021-03-06 ENCOUNTER — Other Ambulatory Visit: Payer: Self-pay | Admitting: Psychiatry

## 2021-03-06 ENCOUNTER — Telehealth: Payer: Self-pay | Admitting: *Deleted

## 2021-03-06 ENCOUNTER — Telehealth: Payer: Self-pay

## 2021-03-06 MED ORDER — DULOXETINE HCL 30 MG PO CPEP: 30.0000 mg | ORAL_CAPSULE | Freq: Every day | ORAL | 1 refills | Status: DC

## 2021-03-06 NOTE — Telephone Encounter (Signed)
Patient calls nurse line regarding medication side effects to Venlafaxine. This medication is being managed by psychiatrist. Patient is reporting increased anxiety, "breakouts in which skin feels like pins and needles"   Recommended that patient follow up with psychiatrist regarding issues with medication and adjustments. Patient verbalizes understanding.   ED precautions given.   Talbot Grumbling, RN

## 2021-03-06 NOTE — Telephone Encounter (Signed)
Discussed with the patient. She has rash, which worsens when she take a shower.  She believes this has started since taking the higher dose of Effexor.  She has never experienced this before.  She denies any shortness of breath, confusion, or diarrhea.  Discussed the following.  -Discontinue venlafaxine - After her skin condition improves, start duloxetine 30 mg daily.  (She is also instructed to start duloxetine if she experiences discontinuation symptoms) -Continue Valium as needed for anxiety -She is advised to contact her PCP if no improvement in her skin condition despite this change -She is advised to go to urgent care/emergency room if any signs of anaphylaxis, such as confusion, diarrhea or shortness of breath.

## 2021-03-06 NOTE — Telephone Encounter (Signed)
Patient called and [facility] stating she would like for provider to please give her a call back. Per patient she thinks she is having a reaction to the medication provider put her on. Per pt she is itching and have hives. Per pt she can not take this medication anymore.   Staff called patient to get more details as to which medication and patient stated its the Effexor 150 and the 75 mg is not working. Per pt she took Benadryl  this morning and one about an hour. Per pt she is not having itching and hives right now. Per pt she have been having this for the past 2 weeks. Per pt she have this breakout on her skin and its under her skin but blood and a little bit of puss came out and that's been for the past 2 week. Per pt she called her PCP and now calling Dr. Modesta Messing. Per pt she wants Dr. Modesta Messing to call her back.

## 2021-03-06 NOTE — Telephone Encounter (Signed)
noted 

## 2021-03-12 ENCOUNTER — Encounter: Payer: Self-pay | Admitting: Family Medicine

## 2021-03-12 ENCOUNTER — Ambulatory Visit (INDEPENDENT_AMBULATORY_CARE_PROVIDER_SITE_OTHER): Payer: Medicare Other | Admitting: Family Medicine

## 2021-03-12 ENCOUNTER — Other Ambulatory Visit: Payer: Self-pay

## 2021-03-12 VITALS — BP 127/80 | HR 104 | Wt 204.6 lb

## 2021-03-12 DIAGNOSIS — L710 Perioral dermatitis: Secondary | ICD-10-CM | POA: Diagnosis not present

## 2021-03-12 HISTORY — DX: Perioral dermatitis: L71.0

## 2021-03-12 MED ORDER — METRONIDAZOLE 0.75 % EX GEL: 1.0000 "application " | Freq: Two times a day (BID) | CUTANEOUS | 0 refills | Status: DC

## 2021-03-12 NOTE — Patient Instructions (Signed)
It was a pleasure seeing you today.  I think you have something called perioral dermatitis which is the rash around her mouth and on your jaw.  I am giving you a medication called MetroGel which she will apply twice daily.  If your symptoms do not resolve or worsen please let us know.  I hope you have a wonderful afternoon!

## 2021-03-12 NOTE — Assessment & Plan Note (Signed)
Patient with signs and symptoms consistent with perioral dermatitis.  Discussed treatment options and elected to go with MetroGel which she will apply twice daily.  Discussed strict return precautions and she will come back if the symptoms do not improve.  No further questions or concerns.

## 2021-03-12 NOTE — Progress Notes (Signed)
    SUBJECTIVE:   CHIEF COMPLAINT / HPI:   Facial rash  Patient reports 3-week history of rash on her face mainly on her jawline and around her mouth.  She reports that she has not switched soaps, laundry detergents, facial creams.  She has changed her psychiatric medication to Effexor but her psychiatrist told her to switch back to the previous medication so that is what she has done.  Cannot identify any other reason to have these lesions.  Has been trying to use Neutrogena facial cleanser since the rash is broken out but it continues to worsen.  She also takes Benadryl when it is itching.  She reports the itching occurs when she gets hot.  She reports that they will become pustules and then burst.  Has never had anything like this before.  Has not been sexually active in over 6 months.    OBJECTIVE:   BP 127/80   Pulse (!) 104   Wt 204 lb 9.6 oz (92.8 kg)   SpO2 98%   BMI 38.66 kg/m   General: Well-appearing 55 year old female in no acute distress Cardiac: Regular rate and rhythm, no murmurs appreciated Respiratory: Normal work of breathing MSK: No gross abnormalities Derm: Patient has erythematous macules and papules on face around mouth as well as on jawline.  There are no pustules noted.  See image below.      ASSESSMENT/PLAN:   Perioral dermatitis Patient with signs and symptoms consistent with perioral dermatitis.  Discussed treatment options and elected to go with MetroGel which she will apply twice daily.  Discussed strict return precautions and she will come back if the symptoms do not improve.  No further questions or concerns.     Gifford Shave, MD [place]

## 2021-03-19 ENCOUNTER — Telehealth: Payer: Self-pay

## 2021-03-19 DIAGNOSIS — Z20822 Contact with and (suspected) exposure to covid-19: Secondary | ICD-10-CM | POA: Diagnosis not present

## 2021-03-19 DIAGNOSIS — U071 COVID-19: Secondary | ICD-10-CM | POA: Diagnosis not present

## 2021-03-19 DIAGNOSIS — M791 Myalgia, unspecified site: Secondary | ICD-10-CM | POA: Diagnosis not present

## 2021-03-19 NOTE — Telephone Encounter (Signed)
Patient calls nurse line regarding metronidazole gel. Patient reports that she has not noticed any improvement in rash since starting medication.   Patient states that she was told to return call to clinic for possible alternative medication if current medication did not work.   Forwarding to Dr. Caron Presume, as he prescribed medication and saw patient last for this concern.   Talbot Grumbling, RN

## 2021-03-19 NOTE — Telephone Encounter (Signed)
Called patient regarding concerns.  We discussed that treatment may take weeks to resolve.  She is going to keep track of it and schedule an appointment if the lesions do not resolve or the rash worsens.

## 2021-03-21 ENCOUNTER — Encounter: Payer: Self-pay | Admitting: Family Medicine

## 2021-03-21 ENCOUNTER — Telehealth (INDEPENDENT_AMBULATORY_CARE_PROVIDER_SITE_OTHER): Payer: Medicare Other | Admitting: Family Medicine

## 2021-03-21 ENCOUNTER — Other Ambulatory Visit: Payer: Self-pay

## 2021-03-21 DIAGNOSIS — U071 COVID-19: Secondary | ICD-10-CM | POA: Diagnosis not present

## 2021-03-21 MED ORDER — NIRMATRELVIR/RITONAVIR (PAXLOVID)TABLET: 3.0000 | ORAL_TABLET | Freq: Two times a day (BID) | ORAL | 0 refills | Status: AC

## 2021-03-21 NOTE — Progress Notes (Signed)
[facility] Telemedicine Visit  Patient consented to have virtual visit and was identified by name and date of birth. Method of visit: Video  Encounter participants: Patient: Laura Burnett - located at home address  Provider: Eulis Foster - located at [facility]  Others (if applicable): none   Chief Complaint: positive COVID test   HPI:  Patient was initially having a worsening HA that prompted her to be evaluated in the UC associated with dizziness  Tested positive on wed  Influenza was negative and went to UC for the test  She has been taking dayquil, nightquil, chloraseptic throat spray (that does not help at all), severe tussin for cough, PRN   She has had two vaccines and two covid boosters  Patient reports that she has been using albuterol inhaler that she was previously prescribed  She reports having a stuffy head and is unsure if the albuterol inhaler is helping or not Patient states she has had intermittent episodes of feeling well and then dizziness and increased SOB  She denies a fever and temp was 95.6 today, 95% on RA with home pulse oximetry Patietn reports having some discomfor  ROS: per HPI  Pertinent PMHx: Bronchiectasis   Exam:  Temp (!) 95.6 F (35.3 C)   SpO2 95%   Respiratory: patient sounds congested, able to speak in complete sentences   Assessment/Plan:  COVID-19 Initially tested positive on 03/19/21  Prescribed paxlovid as patient on day 3 of illness Discussed dosing with patient and need to hold atorvastatin while on this medication Recommended symptomatic management and ED precautions given including worsening respiratory status, patient in agreement    Time spent during visit with patient: 17 minutes

## 2021-03-21 NOTE — Assessment & Plan Note (Addendum)
Initially tested positive on 03/19/21  Prescribed paxlovid as patient on day 3 of illness Discussed dosing with patient and need to hold atorvastatin while on this medication Recommended symptomatic management and ED precautions given including worsening respiratory status, patient in agreement

## 2021-03-25 ENCOUNTER — Other Ambulatory Visit: Payer: Self-pay | Admitting: Family Medicine

## 2021-03-27 ENCOUNTER — Telehealth (INDEPENDENT_AMBULATORY_CARE_PROVIDER_SITE_OTHER): Payer: Medicare Other | Admitting: Family Medicine

## 2021-03-27 DIAGNOSIS — G4733 Obstructive sleep apnea (adult) (pediatric): Secondary | ICD-10-CM

## 2021-03-27 DIAGNOSIS — U071 COVID-19: Secondary | ICD-10-CM | POA: Diagnosis not present

## 2021-03-27 DIAGNOSIS — E010 Iodine-deficiency related diffuse (endemic) goiter: Secondary | ICD-10-CM | POA: Diagnosis not present

## 2021-03-27 NOTE — Assessment & Plan Note (Signed)
Still having symptoms of weakness and fatigue.  No worsening symptoms or rebound symptoms after completing Paxlovid.  Discussed with patient likely normal course of virus.  Continue good oral intake and drinking water. - follow up if symptoms not resolved in 1 week, or sooner if worsen or develop difficulty breathing - Continue isolation precautions until August 1st - Use CPAP mask over next week as able given continued COVID symptoms

## 2021-03-27 NOTE — Progress Notes (Signed)
[facility] Telemedicine Visit  Patient consented to have virtual visit and was identified by name and date of birth. Method of visit: Video  Encounter participants: Patient: Laura Burnett - located at home Provider: Delora Fuel - located at home Others (if applicable): none  Chief Complaint: COVID-19  HPI:  Patient had headache, dizziness and fatigue that started on 7/20 and tested positive for COVID.  Believes got from granddaughter who also had symptoms around the same time.  Vaccinated x 2 and boosted x 2.  Prescribed Paxlovid and finished 5 day course of yesterday.    Symptoms have improved since starting but continues to have fatigue and weakness.  Denies chest pain or difficulty breathing.  Also discusses enlarged thyroid which she has been seen for before, most recently by Dr Susa Simmonds.  Was referred to Endocrinology but indicates were not helpful.  Has appointment with ENT coming up to further address.  Also has CPAP machine for sleep apnea, but indicates still waiting for replacement mask as previous ones not staying on at night.  Indicates interacting with family and Granddaughter keep her spirits up and looks forward with being done isolating.    ROS: per HPI  Pertinent PMHx: CAD, OSA, Hypothyroidism, Thyromegaly  Exam:  There were no vitals taken for this visit.   Physical Exam Constitutional:      General: She is not in acute distress. HENT:     Head: Normocephalic and atraumatic.     Mouth/Throat:     Mouth: Mucous membranes are moist.  Pulmonary:     Effort: No respiratory distress.  Neurological:     Mental Status: She is alert.  Psychiatric:        Mood and Affect: Mood normal.        Behavior: Behavior normal.     Assessment/Plan:  COVID-19 Still having symptoms of weakness and fatigue.  No worsening symptoms or rebound symptoms after completing Paxlovid.  Discussed with patient likely normal course of virus.  Continue good oral  intake and drinking water. - follow up if symptoms not resolved in 1 week, or sooner if worsen or develop difficulty breathing - Continue isolation precautions until August 1st - Use CPAP mask over next week as able given continued COVID symptoms  Thyromegaly - Follow up with ENT appointment.  OSA (obstructive sleep apnea) - continue to follow-up with Pulmonology    Time spent during visit with patient: 22 minutes

## 2021-03-27 NOTE — Assessment & Plan Note (Signed)
-   continue to follow-up with Pulmonology

## 2021-03-27 NOTE — Assessment & Plan Note (Signed)
-   Follow up with ENT appointment.

## 2021-03-31 DIAGNOSIS — M47816 Spondylosis without myelopathy or radiculopathy, lumbar region: Secondary | ICD-10-CM | POA: Diagnosis not present

## 2021-03-31 DIAGNOSIS — S134XXA Sprain of ligaments of cervical spine, initial encounter: Secondary | ICD-10-CM | POA: Diagnosis not present

## 2021-03-31 DIAGNOSIS — M9903 Segmental and somatic dysfunction of lumbar region: Secondary | ICD-10-CM | POA: Diagnosis not present

## 2021-03-31 DIAGNOSIS — S338XXA Sprain of other parts of lumbar spine and pelvis, initial encounter: Secondary | ICD-10-CM | POA: Diagnosis not present

## 2021-03-31 DIAGNOSIS — M9901 Segmental and somatic dysfunction of cervical region: Secondary | ICD-10-CM | POA: Diagnosis not present

## 2021-03-31 DIAGNOSIS — S233XXA Sprain of ligaments of thoracic spine, initial encounter: Secondary | ICD-10-CM | POA: Diagnosis not present

## 2021-03-31 DIAGNOSIS — M9902 Segmental and somatic dysfunction of thoracic region: Secondary | ICD-10-CM | POA: Diagnosis not present
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
## 2021-04-11 ENCOUNTER — Encounter: Payer: Self-pay | Admitting: Psychiatry

## 2021-04-11 ENCOUNTER — Telehealth (INDEPENDENT_AMBULATORY_CARE_PROVIDER_SITE_OTHER): Payer: Medicare Other | Admitting: Psychiatry

## 2021-04-11 ENCOUNTER — Other Ambulatory Visit: Payer: Self-pay

## 2021-04-11 DIAGNOSIS — F411 Generalized anxiety disorder: Secondary | ICD-10-CM

## 2021-04-11 DIAGNOSIS — F330 Major depressive disorder, recurrent, mild: Secondary | ICD-10-CM | POA: Diagnosis not present

## 2021-04-11 DIAGNOSIS — F451 Undifferentiated somatoform disorder: Secondary | ICD-10-CM | POA: Diagnosis not present

## 2021-04-11 MED ORDER — DIAZEPAM 2 MG PO TABS: 1.0000 mg | ORAL_TABLET | Freq: Every day | ORAL | 1 refills | Status: DC | PRN

## 2021-04-11 MED ORDER — DULOXETINE HCL 60 MG PO CPEP: 60.0000 mg | ORAL_CAPSULE | Freq: Every day | ORAL | 0 refills | Status: DC

## 2021-04-11 NOTE — Patient Instructions (Addendum)
1. Increase duloxetine 60 mg daily  2. Discontinue nortriptyline  3. Continue valium 1 mg daily as needed for anxiety 4. Next appointment: 9/8 at 2:30

## 2021-04-13 DIAGNOSIS — M79674 Pain in right toe(s): Secondary | ICD-10-CM | POA: Diagnosis not present

## 2021-04-17 ENCOUNTER — Other Ambulatory Visit: Payer: Self-pay

## 2021-04-17 ENCOUNTER — Ambulatory Visit (INDEPENDENT_AMBULATORY_CARE_PROVIDER_SITE_OTHER): Payer: Medicare Other | Admitting: Endocrinology

## 2021-04-17 VITALS — BP 110/80 | HR 101 | Ht 61.0 in | Wt 201.2 lb

## 2021-04-17 DIAGNOSIS — E039 Hypothyroidism, unspecified: Secondary | ICD-10-CM

## 2021-04-17 DIAGNOSIS — M542 Cervicalgia: Secondary | ICD-10-CM

## 2021-04-17 DIAGNOSIS — M9901 Segmental and somatic dysfunction of cervical region: Secondary | ICD-10-CM | POA: Diagnosis not present

## 2021-04-17 DIAGNOSIS — M47816 Spondylosis without myelopathy or radiculopathy, lumbar region: Secondary | ICD-10-CM | POA: Diagnosis not present

## 2021-04-17 DIAGNOSIS — S233XXA Sprain of ligaments of thoracic spine, initial encounter: Secondary | ICD-10-CM | POA: Diagnosis not present

## 2021-04-17 DIAGNOSIS — M9903 Segmental and somatic dysfunction of lumbar region: Secondary | ICD-10-CM | POA: Diagnosis not present

## 2021-04-17 DIAGNOSIS — S134XXA Sprain of ligaments of cervical spine, initial encounter: Secondary | ICD-10-CM | POA: Diagnosis not present

## 2021-04-17 DIAGNOSIS — S338XXA Sprain of other parts of lumbar spine and pelvis, initial encounter: Secondary | ICD-10-CM | POA: Diagnosis not present

## 2021-04-17 DIAGNOSIS — M9902 Segmental and somatic dysfunction of thoracic region: Secondary | ICD-10-CM | POA: Diagnosis not present

## 2021-04-17 LAB — BASIC METABOLIC PANEL
BUN: 11 mg/dL (ref 6–23)
CO2: 27 mEq/L (ref 19–32)
Calcium: 9.4 mg/dL (ref 8.4–10.5)
Chloride: 104 mEq/L (ref 96–112)
Creatinine, Ser: 0.87 mg/dL (ref 0.40–1.20)
GFR: 73.93 mL/min (ref >60.00)
Glucose, Bld: 81 mg/dL (ref 70–99)
Potassium: 4.2 mEq/L (ref 3.5–5.1)
Sodium: 140 mEq/L (ref 135–145)

## 2021-04-17 LAB — TSH: TSH: 2.17 u[IU]/mL (ref 0.35–5.50)

## 2021-04-17 LAB — T4, FREE: Free T4: 0.91 ng/dL (ref 0.60–1.60)

## 2021-04-17 NOTE — Progress Notes (Signed)
Subjective:    Patient ID: Laura Burnett, female    DOB: November 22, 1965, 55 y.o.   MRN: [id number]  HPI I last saw pt in 2019.  She is now ref by Dr Andria Frames, for f/u of chronic hypothyroidism (dx'ed 1992; she had a thyroid bx in approx 1992 (while living in [place]), and that was benign; [place] in 2017 showed thyromegaly with heterogeneous and hyperemic parenchyma, but no nodule or other focal lesion; CT in 2017 showed no focal mass or lymphadenopathy, and the thyroid gland is enlarged and slightly heterogeneous; [place] is 2022 said no f/u was needed).  Pt reports swelling and pain at the right lat neck.   Past Medical History:  Diagnosis Date   Allergy    seasonal allergies/animals   Anxiety    Arthritis    Asthma    as a child " exercise induced"   Bronchitis    Constipation    Depression    DJD (degenerative joint disease)    Fibromyalgia    GERD (gastroesophageal reflux disease)    Headache    Hypothyroidism    Obesity    PONV (postoperative nausea and vomiting)    Wears glasses     Past Surgical History:  Procedure Laterality Date   ABDOMINAL HYSTERECTOMY     ADENOIDECTOMY     APPLICATION OF ROBOTIC ASSISTANCE FOR SPINAL PROCEDURE N/A 12/21/2016   Procedure: APPLICATION OF ROBOTIC ASSISTANCE FOR SPINAL PROCEDURE;  Surgeon: Kevan Ny Ditty, MD;  Location: [facility];  Service: Neurosurgery;  Laterality: N/A;   BACK SURGERY     L5-S1   CARPAL TUNNEL RELEASE Right 01/16/2019   Procedure: RIGHT CARPAL TUNNEL RELEASE;  Surgeon: [name], MD;  Location: [facility];  Service: Orthopedics;  Laterality: Right;   COLONOSCOPY     CYST EXCISION     uterine ;subsequent sx followed   FOOT SURGERY     HAND SURGERY     from AA   KNEE ARTHROSCOPY     x2   TONSILLECTOMY     TRIGGER FINGER RELEASE Right 01/16/2019   Procedure: RIGHT TRIGGER THUMB RELEASE;  Surgeon: [name], MD;  Location: [facility];  Service: Orthopedics;  Laterality: Right;   TRIGGER FINGER  RELEASE Right 12/04/2019   Procedure: right index trigger finger release;  Surgeon: [name], MD;  Location: [facility];  Service: Orthopedics;  Laterality: Right;   UPPER GASTROINTESTINAL ENDOSCOPY      Social History   Socioeconomic History   Marital status: Divorced    Spouse name: Not on file   Number of children: 1   Years of education: 12   Highest education level: High school graduate  Occupational History   Occupation: Disabled   Tobacco Use   Smoking status: Former   Smokeless tobacco: Never   Tobacco comments:    stopped smoking cigarettes at age 72  Vaping Use   Vaping Use: Never used  Substance and Sexual Activity   Alcohol use: Yes    Comment: occasional beer   Drug use: No    Comment:  per pt when she was 16 year   Sexual activity: Not Currently  Other Topics Concern   Not on file  Social History Narrative   Patient lives alone in [place], [place].   Patient has one son who lives nearby.    Patient has 2 grandchildren.    Patient enjoys sewing, being outside in her garden, and spending time  with family.    Social Determinants of Health   Financial Resource Strain: Low Risk    Difficulty of Paying Living Expenses: Not hard at all  Food Insecurity: No Food Insecurity   Worried About Charity fundraiser in the Last Year: Never true   [place] in the Last Year: Never true  Transportation Needs: No Transportation Needs   Lack of Transportation (Medical): No   Lack of Transportation (Non-Medical): No  Physical Activity: Inactive   Days of Exercise per Week: 0 days   Minutes of Exercise per Session: 0 min  Stress: Stress Concern Present   Feeling of Stress : Rather much  Social Connections: Socially Isolated   Frequency of Communication with Friends and Family: More than three times a week   Frequency of Social Gatherings with Friends and Family: More than three times a week   Attends Religious Services: Never   Marine scientist or  Organizations: No   Attends Music therapist: Never   Marital Status: Divorced  Human resources officer Violence: Not At Risk   Fear of Current or Ex-Partner: No   Emotionally Abused: No   Physically Abused: No   Sexually Abused: No    Current Outpatient Medications on File Prior to Visit  Medication Sig Dispense Refill   albuterol (VENTOLIN HFA) 108 (90 Base) MCG/ACT inhaler INHALE 2 PUFFS BY MOUTH EVERY 4 HOURS AS NEEDED FOR WHEEZING AND FOR SHORTNESS OF BREATH 18 g 0   atorvastatin (LIPITOR) 40 MG tablet Take 1 tablet (40 mg total) by mouth daily. 90 tablet 3   cyclobenzaprine (FLEXERIL) 10 MG tablet Take 1 tablet by mouth three times daily as needed for muscle spasm 270 tablet 0   [START ON 04/23/2021] diazepam (VALIUM) 2 MG tablet Take 0.5 tablets (1 mg total) by mouth daily as needed for anxiety. 15 tablet 1   docusate sodium (COLACE) 100 MG capsule Take 100 mg by mouth 2 (two) times daily.     DULoxetine (CYMBALTA) 60 MG capsule Take 1 capsule (60 mg total) by mouth daily. 30 capsule 0   fluticasone (FLONASE) 50 MCG/ACT nasal spray Place 2 sprays into both nostrils daily. 16 g 6   gabapentin (NEURONTIN) 800 MG tablet TAKE 1 TABLET BY MOUTH THREE TIMES DAILY 180 tablet 0   ibuprofen (ADVIL,MOTRIN) 200 MG tablet Take 600 mg by mouth every 8 (eight) hours as needed for headache or moderate pain.      levocetirizine (XYZAL) 5 MG tablet Take 1 tablet (5 mg total) by mouth every evening. 30 tablet 2   levothyroxine (EUTHYROX) 100 MCG tablet Take 1 tablet (100 mcg total) by mouth daily. 90 tablet 0   metroNIDAZOLE (METROGEL) 0.75 % gel Apply 1 application topically 2 (two) times daily. 45 g 0   omeprazole (PRILOSEC) 40 MG capsule Take 1 capsule by mouth twice daily 180 capsule 0   No current facility-administered medications on file prior to visit.    Allergies  Allergen Reactions   Ciprofloxacin Nausea And Vomiting   Phentermine Other (See Comments)    Tearing, lightheadedness,  depression   Tape Other (See Comments)    " I get red and itchy." Paper tape okay   Percocet [Oxycodone-Acetaminophen] Hives and Rash   Tetanus Toxoids Nausea And Vomiting   Tylenol With Codeine #3 [Acetaminophen-Codeine] Rash    Family History  Problem Relation Age of Onset   Thyroid disease Sister    Thyroid disease Brother  Stomach cancer Brother    Alzheimer's disease Mother    Diabetes Mother    Heart attack Father    Colon cancer Father    Heart failure Father    Cancer Brother    Cancer Brother    Esophageal cancer Neg Hx    Rectal cancer Neg Hx     BP 110/80 (BP Location: Right Arm, Patient Position: Sitting, Cuff Size: Normal)   Pulse (!) 101   Ht '5\' 1"'$  (1.549 m)   Wt 201 lb 3.2 oz (91.3 kg)   SpO2 96%   BMI 38.02 kg/m      Review of Systems     Objective:   Physical Exam NECK: thyroid is slightly enlarged, with a firm surface.   Lab Results  Component Value Date   CREATININE 0.78 04/30/2020   BUN 11 04/30/2020   NA 139 04/30/2020   K 4.4 04/30/2020   CL 101 04/30/2020   CO2 22 04/30/2020     I have reviewed outside records, and summarized: Pt was noted to have goiter, and referred here.  She saw Dr Lucia Gaskins, who did not find ENT cause of sxs.    Lab Results  Component Value Date   TSH 2.17 04/17/2021      Assessment & Plan:  Neck pain.  We discussed that this is somewhat outside of my field, but pt requests further eval Hypothyroidism: well-controlled.  Please continue the same synthroid.  Patient Instructions  blood tests are requested for you today.  We'll let you know about the results. Let's check a CT scan.  you will receive a phone call, about a day and time for an appointment. Please go back to see Dr Lucia Gaskins, for the neck symptoms.  you will receive a phone call, about a day and time for an appointment.

## 2021-04-17 NOTE — Patient Instructions (Addendum)
blood tests are requested for you today.  We'll let you know about the results. Let's check a CT scan.  you will receive a phone call, about a day and time for an appointment. Please go back to see Dr Lucia Gaskins, for the neck symptoms.  you will receive a phone call, about a day and time for an appointment.

## 2021-04-21 ENCOUNTER — Telehealth: Payer: Self-pay | Admitting: Endocrinology

## 2021-04-21 NOTE — Telephone Encounter (Signed)
Pt wants a referral sent over to [facility].

## 2021-04-23 ENCOUNTER — Ambulatory Visit (HOSPITAL_COMMUNITY): Payer: Self-pay | Admitting: Psychiatry

## 2021-04-24 DIAGNOSIS — S134XXA Sprain of ligaments of cervical spine, initial encounter: Secondary | ICD-10-CM | POA: Diagnosis not present

## 2021-04-24 DIAGNOSIS — S338XXA Sprain of other parts of lumbar spine and pelvis, initial encounter: Secondary | ICD-10-CM | POA: Diagnosis not present

## 2021-04-24 DIAGNOSIS — M9902 Segmental and somatic dysfunction of thoracic region: Secondary | ICD-10-CM | POA: Diagnosis not present

## 2021-04-24 DIAGNOSIS — M47816 Spondylosis without myelopathy or radiculopathy, lumbar region: Secondary | ICD-10-CM | POA: Diagnosis not present

## 2021-04-24 DIAGNOSIS — S233XXA Sprain of ligaments of thoracic spine, initial encounter: Secondary | ICD-10-CM | POA: Diagnosis not present

## 2021-04-24 DIAGNOSIS — M9903 Segmental and somatic dysfunction of lumbar region: Secondary | ICD-10-CM | POA: Diagnosis not present

## 2021-04-24 DIAGNOSIS — M9901 Segmental and somatic dysfunction of cervical region: Secondary | ICD-10-CM | POA: Diagnosis not present

## 2021-04-30 ENCOUNTER — Ambulatory Visit (INDEPENDENT_AMBULATORY_CARE_PROVIDER_SITE_OTHER): Payer: Medicare Other | Admitting: Family Medicine

## 2021-04-30 ENCOUNTER — Encounter: Payer: Self-pay | Admitting: Family Medicine

## 2021-04-30 ENCOUNTER — Other Ambulatory Visit: Payer: Self-pay

## 2021-04-30 VITALS — BP 126/64 | HR 86 | Ht 61.0 in | Wt 200.0 lb

## 2021-04-30 DIAGNOSIS — L710 Perioral dermatitis: Secondary | ICD-10-CM | POA: Diagnosis not present

## 2021-04-30 DIAGNOSIS — R0981 Nasal congestion: Secondary | ICD-10-CM

## 2021-04-30 DIAGNOSIS — M109 Gout, unspecified: Secondary | ICD-10-CM

## 2021-04-30 MED ORDER — BENZOYL PEROXIDE-ERYTHROMYCIN 5-3 % EX GEL: Freq: Two times a day (BID) | CUTANEOUS | 0 refills | Status: DC

## 2021-04-30 NOTE — Patient Instructions (Signed)
It was great seeing you today.  Regarding your gout I think it is resolving but you can stop taking the indomethacin.  You can take ibuprofen for this instead and it should continue to improve.  I also recommend Tylenol arthritic pain.  Regarding the rash I would like to try another topical medication first called Benzamycin.  It is benzyl peroxide and erythromycin antibiotics.  This should treat your rash.  Lets try this for a couple of weeks and if it does not improve we may need to escalate to oral medications.  If you have any questions or concerns let me know.  I hope you have a wonderful day!

## 2021-04-30 NOTE — Progress Notes (Signed)
    SUBJECTIVE:   CHIEF COMPLAINT / HPI:   Gout Patient reports that she had what she thinks is a gout flare in her big toe.  She reports it was swollen, red, tender.  She went to urgent care and they did an x-ray and then prescribed indomethacin for gout flare.  She reports that she took a few doses but it was upsetting her stomach so she stopped taking it.  The symptoms have resolved although she still having a little bit of pain in the big toe but the erythema and warmth have decreased.  Facial rash Patient reports she has been using the MetroGel as prescribed without any improvement in her facial rash.  She would like to try some other management for this.  She reports that one of the lesions became a big pimple and popped.  Denies any fevers, chills.  URI symptoms Patient reports that she has had congestion and rhinorrhea for 5 days.  She has tried over-the-counter antihistamines as well as Flonase.  She also takes NyQuil at night and DayQuil during the day.  She thinks she may have a sinus infection and would like antibiotics.    OBJECTIVE:   BP 126/64   Pulse 86   Ht '5\' 1"'$  (1.549 m)   Wt 200 lb (90.7 kg)   SpO2 95%   BMI 37.79 kg/m   General: Well-appearing 55 year old female, no acute distress HEENT: Patient has moist mucous membranes, no rhinorrhea appreciated, multiple lesions on cheeks bilaterally as well as around mouth. Cardiac: Regular rate and rhythm, no murmurs appreciated Respiratory: Normal work of breathing, lungs clear to auscultation bilaterally Abdomen: Soft MSK: No erythema or warmth noted to left great toe.  Did tenderness to palpation.  ASSESSMENT/PLAN:   Perioral dermatitis Symptoms have continued after the initiation of metronidazole cream.  May be some other form of adult acne.  I have sent in prescription for antibiotic cream which patient will use and follow-up in 2-4 weeks.  She may need the addition of an oral antibiotic.  Nasal  congestion Patient with 5-day history of nasal congestion after trying Flonase as well as NyQuil and DayQuil.  No other sick symptoms.  Reports she had COVID-19 approximately 2 months ago.  Recommended continued supportive care.  Do not feel she needs an antibiotic at this time.  We will continue to monitor symptoms and if they persist or worsen for 10 to 14 days she may require oral antibiotic.  Strict ED and return precautions given.  Gout Patient with gout flare a few days ago.  Was seen at urgent care and prescribed indomethacin.  Was unable to tolerate continued use of the medication but the symptoms have since resolved.  Recommended use of ibuprofen and continued monitoring.  If gout flares persist she may need controller medication.  Consider uric acid level at future visit.     Gifford Shave, MD [place]

## 2021-05-01 ENCOUNTER — Telehealth: Payer: Self-pay

## 2021-05-01 NOTE — Telephone Encounter (Signed)
Patient calls nurse line regarding office visit on 8/31. Patient reports that she was having symptoms of a sinus infection, however, did not receive treatment.   Reports that she does not want to have to come back in for this concern as she lives in [place].   Please advise.   Talbot Grumbling, RN

## 2021-05-02 DIAGNOSIS — M109 Gout, unspecified: Secondary | ICD-10-CM | POA: Insufficient documentation

## 2021-05-02 NOTE — Assessment & Plan Note (Signed)
Patient with 5-day history of nasal congestion after trying Flonase as well as NyQuil and DayQuil.  No other sick symptoms.  Reports she had COVID-19 approximately 2 months ago.  Recommended continued supportive care.  Do not feel she needs an antibiotic at this time.  We will continue to monitor symptoms and if they persist or worsen for 10 to 14 days she may require oral antibiotic.  Strict ED and return precautions given.

## 2021-05-02 NOTE — Assessment & Plan Note (Signed)
Symptoms have continued after the initiation of metronidazole cream.  May be some other form of adult acne.  I have sent in prescription for antibiotic cream which patient will use and follow-up in 2-4 weeks.  She may need the addition of an oral antibiotic.

## 2021-05-02 NOTE — Assessment & Plan Note (Signed)
Patient with gout flare a few days ago.  Was seen at urgent care and prescribed indomethacin.  Was unable to tolerate continued use of the medication but the symptoms have since resolved.  Recommended use of ibuprofen and continued monitoring.  If gout flares persist she may need controller medication.  Consider uric acid level at future visit.
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
## 2021-05-07 ENCOUNTER — Ambulatory Visit (HOSPITAL_COMMUNITY): Payer: Self-pay | Admitting: Psychiatry

## 2021-05-08 ENCOUNTER — Other Ambulatory Visit: Payer: Self-pay

## 2021-05-08 ENCOUNTER — Telehealth: Payer: Medicare Other | Admitting: Psychiatry

## 2021-05-08 DIAGNOSIS — G4733 Obstructive sleep apnea (adult) (pediatric): Secondary | ICD-10-CM | POA: Diagnosis not present

## 2021-05-08 DIAGNOSIS — K219 Gastro-esophageal reflux disease without esophagitis: Secondary | ICD-10-CM | POA: Diagnosis not present

## 2021-05-08 DIAGNOSIS — J309 Allergic rhinitis, unspecified: Secondary | ICD-10-CM | POA: Diagnosis not present

## 2021-05-08 DIAGNOSIS — J342 Deviated nasal septum: Secondary | ICD-10-CM | POA: Diagnosis not present

## 2021-05-08 DIAGNOSIS — E010 Iodine-deficiency related diffuse (endemic) goiter: Secondary | ICD-10-CM | POA: Diagnosis not present

## 2021-05-08 DIAGNOSIS — R1310 Dysphagia, unspecified: Secondary | ICD-10-CM | POA: Diagnosis not present

## 2021-05-08 DIAGNOSIS — R682 Dry mouth, unspecified: Secondary | ICD-10-CM | POA: Diagnosis not present

## 2021-05-08 DIAGNOSIS — M542 Cervicalgia: Secondary | ICD-10-CM | POA: Diagnosis not present

## 2021-05-14 ENCOUNTER — Other Ambulatory Visit: Payer: Self-pay

## 2021-05-14 ENCOUNTER — Ambulatory Visit (INDEPENDENT_AMBULATORY_CARE_PROVIDER_SITE_OTHER): Payer: Medicare Other | Admitting: Psychiatry

## 2021-05-14 ENCOUNTER — Encounter (HOSPITAL_COMMUNITY): Payer: Self-pay | Admitting: Psychiatry

## 2021-05-14 DIAGNOSIS — F330 Major depressive disorder, recurrent, mild: Secondary | ICD-10-CM

## 2021-05-14 DIAGNOSIS — F411 Generalized anxiety disorder: Secondary | ICD-10-CM

## 2021-05-15 ENCOUNTER — Encounter (HOSPITAL_COMMUNITY): Payer: Self-pay | Admitting: Psychiatry

## 2021-05-15 DIAGNOSIS — S233XXA Sprain of ligaments of thoracic spine, initial encounter: Secondary | ICD-10-CM | POA: Diagnosis not present

## 2021-05-15 DIAGNOSIS — M9901 Segmental and somatic dysfunction of cervical region: Secondary | ICD-10-CM | POA: Diagnosis not present

## 2021-05-15 DIAGNOSIS — S338XXA Sprain of other parts of lumbar spine and pelvis, initial encounter: Secondary | ICD-10-CM | POA: Diagnosis not present

## 2021-05-15 DIAGNOSIS — S134XXA Sprain of ligaments of cervical spine, initial encounter: Secondary | ICD-10-CM | POA: Diagnosis not present

## 2021-05-15 DIAGNOSIS — M9902 Segmental and somatic dysfunction of thoracic region: Secondary | ICD-10-CM | POA: Diagnosis not present

## 2021-05-15 DIAGNOSIS — M9903 Segmental and somatic dysfunction of lumbar region: Secondary | ICD-10-CM | POA: Diagnosis not present

## 2021-05-15 DIAGNOSIS — M47816 Spondylosis without myelopathy or radiculopathy, lumbar region: Secondary | ICD-10-CM | POA: Diagnosis not present
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
## 2021-05-19 ENCOUNTER — Telehealth: Payer: Self-pay

## 2021-05-19 NOTE — Telephone Encounter (Signed)
pt called states she needs refills on her cymbalta

## 2021-05-20 MED ORDER — DULOXETINE HCL 60 MG PO CPEP
60.0000 mg | ORAL_CAPSULE | Freq: Every day | ORAL | 0 refills | Status: DC
Start: 2021-05-20 — End: 2021-06-27

## 2021-05-20 NOTE — Telephone Encounter (Signed)
pt called left message that she needs a refill on the cymbalata

## 2021-05-22 ENCOUNTER — Ambulatory Visit (INDEPENDENT_AMBULATORY_CARE_PROVIDER_SITE_OTHER): Payer: Medicare Other | Admitting: Orthopaedic Surgery

## 2021-05-22 ENCOUNTER — Other Ambulatory Visit: Payer: Self-pay

## 2021-05-22 ENCOUNTER — Encounter: Payer: Self-pay | Admitting: Orthopaedic Surgery

## 2021-05-22 VITALS — Ht 61.0 in | Wt 200.0 lb

## 2021-05-22 DIAGNOSIS — Z981 Arthrodesis status: Secondary | ICD-10-CM

## 2021-05-22 DIAGNOSIS — M5416 Radiculopathy, lumbar region: Secondary | ICD-10-CM | POA: Diagnosis not present

## 2021-05-22 DIAGNOSIS — M542 Cervicalgia: Secondary | ICD-10-CM

## 2021-05-22 NOTE — Progress Notes (Signed)
Office Visit Note   Patient: Laura Burnett           Date of Birth: 03-18-1966           MRN: [id number] Visit Date: 05/22/2021              Requested by: Gifford Shave, MD [address],  [place] [address] PCP: Gifford Shave, MD   Assessment & Plan: Visit Diagnoses:  1. Neck pain   2. Lumbar radiculopathy   3. History of lumbar spinal fusion     Plan: She can try some anti-inflammatory for her great toe discomfort we discussed shoe wear with wide toe box.  We will set up for some physical therapy for treatment of her neck and back symptoms.  Recheck 8 weeks.  Follow-Up Instructions: Return in about 8 weeks (around 07/17/2021).   Orders:  No orders of the defined types were placed in this encounter.  No orders of the defined types were placed in this encounter.     Procedures: No procedures performed   Clinical Data: No additional findings.   Subjective: Chief Complaint  Patient presents with   Neck - Pain   Lower Back - Pain   Right Foot - Pain    HPI patient returns with multiple problems.  She states she has had pain first MTP joint 1 point it was slightly red and initially she thought she might have gout saw her PCP obtained x-rays and told her that it was not gout.  She is noted some pain with shoe wearing a switch shoes.  Currently has on sketchers which is helped.  Also had some numbness in her entire right leg.  Previous surgery by Dr. Cyndy Freeze with pseudoarthrosis later revision by me.  She denies claudication symptoms.  She has had stiffness in her neck some pain that radiates into her shoulders and sometimes numbness in her whole arm on the right into her hands.  Some pain radiates into the scapula.  Review of Systems reviewed and updated negative other than as mentioned in HPI.   Objective: Vital Signs: Ht 5\' 1"  (1.549 m)   Wt 200 lb (90.7 kg)   BMI 37.79 kg/m   Physical Exam Constitutional:      Appearance: She is well-developed.   HENT:     Head: Normocephalic.     Right Ear: External ear normal.     Left Ear: External ear normal. There is no impacted cerumen.  Eyes:     Pupils: Pupils are equal, round, and reactive to light.  Neck:     Thyroid: No thyromegaly.     Trachea: No tracheal deviation.  Cardiovascular:     Rate and Rhythm: Normal rate.  Pulmonary:     Effort: Pulmonary effort is normal.  Abdominal:     Palpations: Abdomen is soft.  Musculoskeletal:     Cervical back: No rigidity.  Skin:    General: Skin is warm and dry.  Neurological:     Mental Status: She is alert and oriented to person, place, and time.  Psychiatric:        Behavior: Behavior normal.    Ortho Exam negative straight leg raising 90 degrees.  She has some tenderness over the medial first MTP joint where there is a tiny bunion by exam.  No dorsal bunion.  Posterior tibial ankle dorsiflexion plantarflexion is normal.  No sciatic notch tenderness.  Some stiffness with forward flexion extension well-healed lumbar incision from L5-S1 revision.  Upper and lower EXTR extremity reflexes are symmetrical.  She has some tenderness over the trapezius muscle tenderness over brachial plexus right left.  No upper extremity atrophy.  Specialty Comments:  No specialty comments available.  Imaging: No results found.   PMFS History: Patient Active Problem List   Diagnosis Date Noted   History of lumbar spinal fusion 05/22/2021   Gout 05/02/2021   Hypothyroidism 04/17/2021   COVID-19 03/21/2021   Perioral dermatitis 03/12/2021   Globus sensation 01/10/2021   Nasal congestion 12/04/2020   CAD (coronary artery disease) 07/16/2020   OSA (obstructive sleep apnea) 07/02/2020   Bronchiectasis without complication ([facility]) 28/31/5176   Seasonal allergies 16/02/3709   Lichen simplex chronicus 01/15/2020   Verruca 01/15/2020   Trigger finger, right index finger 11/17/2019   Elevated alkaline phosphatase level 10/12/2019   Abdominal pain,  acute, epigastric 07/21/2019   Carpal tunnel syndrome, right upper limb 12/16/2018   Thyromegaly 10/18/2018   Cough 11/30/2017   Adjustment disorder with depressed mood 05/24/2017   Anxiety disorder 03/16/2017   Lumbar radiculopathy 12/21/2016   Neck pain 12/20/2015   Past Medical History:  Diagnosis Date   Allergy    seasonal allergies/animals   Anxiety    Arthritis    Asthma    as a child " exercise induced"   Bronchitis    Constipation    Depression    DJD (degenerative joint disease)    Fibromyalgia    GERD (gastroesophageal reflux disease)    Headache    Hypothyroidism    Obesity    PONV (postoperative nausea and vomiting)    Wears glasses     Family History  Problem Relation Age of Onset   Alzheimer's disease Mother    Diabetes Mother    Heart attack Father    Colon cancer Father    Heart failure Father    Thyroid disease Sister    Thyroid disease Brother    Stomach cancer Brother    Cancer Brother    Cancer Brother    ADD / ADHD Other    Esophageal cancer Neg Hx    Rectal cancer Neg Hx     Past Surgical History:  Procedure Laterality Date   ABDOMINAL HYSTERECTOMY     ADENOIDECTOMY     APPLICATION OF ROBOTIC ASSISTANCE FOR SPINAL PROCEDURE N/A 12/21/2016   Procedure: APPLICATION OF ROBOTIC ASSISTANCE FOR SPINAL PROCEDURE;  Surgeon: Kevan Ny Ditty, MD;  Location: [facility];  Service: Neurosurgery;  Laterality: N/A;   BACK SURGERY     L5-S1   CARPAL TUNNEL RELEASE Right 01/16/2019   Procedure: RIGHT CARPAL TUNNEL RELEASE;  Surgeon: [name], MD;  Location: [facility];  Service: Orthopedics;  Laterality: Right;   COLONOSCOPY     CYST EXCISION     uterine ;subsequent sx followed   FOOT SURGERY     HAND SURGERY     from [facility]   KNEE ARTHROSCOPY     x2   TONSILLECTOMY     TRIGGER FINGER RELEASE Right 01/16/2019   Procedure: RIGHT TRIGGER THUMB RELEASE;  Surgeon: [name], MD;  Location: [facility];  Service:  Orthopedics;  Laterality: Right;   TRIGGER FINGER RELEASE Right 12/04/2019   Procedure: right index trigger finger release;  Surgeon: [name], MD;  Location: [facility];  Service: Orthopedics;  Laterality: Right;   UPPER GASTROINTESTINAL ENDOSCOPY     Social History   Occupational History   Occupation: Disabled  Tobacco Use   Smoking status: Former   Smokeless tobacco: Never   Tobacco comments:    stopped smoking cigarettes at age 72  Vaping Use   Vaping Use: Never used  Substance and Sexual Activity   Alcohol use: Not Currently   Drug use: No   Sexual activity: Not Currently

## 2021-05-27 DIAGNOSIS — M9902 Segmental and somatic dysfunction of thoracic region: Secondary | ICD-10-CM | POA: Diagnosis not present

## 2021-05-27 DIAGNOSIS — M47816 Spondylosis without myelopathy or radiculopathy, lumbar region: Secondary | ICD-10-CM | POA: Diagnosis not present

## 2021-05-27 DIAGNOSIS — S134XXA Sprain of ligaments of cervical spine, initial encounter: Secondary | ICD-10-CM | POA: Diagnosis not present

## 2021-05-27 DIAGNOSIS — M9901 Segmental and somatic dysfunction of cervical region: Secondary | ICD-10-CM | POA: Diagnosis not present

## 2021-05-27 DIAGNOSIS — S338XXA Sprain of other parts of lumbar spine and pelvis, initial encounter: Secondary | ICD-10-CM | POA: Diagnosis not present

## 2021-05-27 DIAGNOSIS — S233XXA Sprain of ligaments of thoracic spine, initial encounter: Secondary | ICD-10-CM | POA: Diagnosis not present

## 2021-05-27 DIAGNOSIS — M9903 Segmental and somatic dysfunction of lumbar region: Secondary | ICD-10-CM | POA: Diagnosis not present

## 2021-05-28 ENCOUNTER — Other Ambulatory Visit: Payer: Self-pay

## 2021-05-28 ENCOUNTER — Encounter (HOSPITAL_COMMUNITY): Payer: Self-pay

## 2021-05-28 ENCOUNTER — Ambulatory Visit (INDEPENDENT_AMBULATORY_CARE_PROVIDER_SITE_OTHER): Payer: Medicare Other | Admitting: Psychiatry

## 2021-05-28 DIAGNOSIS — F330 Major depressive disorder, recurrent, mild: Secondary | ICD-10-CM

## 2021-05-28 NOTE — Plan of Care (Signed)
Patient participated in development of plan, gave verbal consent and agreement.

## 2021-05-28 NOTE — Progress Notes (Signed)
Virtual Visit via Video Note  I connected with Laura Burnett on 05/28/21 at 1:10 PM EDT by a video enabled telemedicine application and verified that I am speaking with the correct person using two identifiers.  Location: Patient: Home Provider: [place] office   I discussed the limitations of evaluation and management by telemedicine and the availability of in person appointments. The patient expressed understanding and agreed to proceed.    I provided 42 minutes of non-face-to-face time during this encounter.   Alonza Smoker, LCSW    THERAPIST PROGRESS NOTE  Session Time: Wednesday 05/28/2021 1:10 PM -1:52 PM   Participation Level: Active  Behavioral Response: CasualAlertAnxious and Irritable  Type of Therapy: Individual Therapy  Treatment Goals addressed: Anxiety  Interventions: CBT  Summary: Laura Burnett is a 55 y.o. female who is referred for services by psychiatrist Dr. Lisette Grinder due to patient's.  Symptoms of depression and anxiety.  She denies any psychiatric hospitalizations.  She participated in outpatient therapy in this practice with Vonna Kotyk sheets and last was seen in 2019.  Patient states needing help with anxiety as she becomes stressed and irritated very quickly.  She reports difficulty being around people and states avoiding crowds.  Patient last was seen via virtual visit about 2 weeks ago for the assessment appointment.  She reports little to no change in symptoms.  She reports minimal depression but significant anxiety.  She continues to avoid places and people that may possibly trigger anxiety.  She does maintain contact with her son and her granddaughter and reports this is helpful she also reports stress and anxiety regarding health issues and medication changes.  Suicidal/Homicidal: Nowithout intent/plan  Therapist Response: Reviewed symptoms, discussed stressors, facilitated expression of thoughts and feelings, validated feelings,  developed treatment plan, obtained patient's verbal consent/agreement for plan, oriented patient to CBT, will send patient handouts in preparation for next session  Plan: Return again in 2 weeks.  Diagnosis: Axis I: Major depressive disorder, recurrent episode, mild        Alonza Smoker, LCSW 05/28/2021

## 2021-05-28 NOTE — Plan of Care (Signed)
Pt gives verbal consent/agreement with plan

## 2021-05-29 ENCOUNTER — Other Ambulatory Visit: Payer: Self-pay

## 2021-05-29 MED ORDER — LEVOTHYROXINE SODIUM 100 MCG PO TABS
100.0000 ug | ORAL_TABLET | Freq: Every day | ORAL | 0 refills | Status: DC
Start: 2021-05-29 — End: 2021-08-26

## 2021-05-29 MED ORDER — GABAPENTIN 800 MG PO TABS: 800.0000 mg | ORAL_TABLET | Freq: Three times a day (TID) | ORAL | 0 refills | Status: DC

## 2021-05-29 NOTE — Telephone Encounter (Signed)
Pharmacy is calling to see if it ok to change patient's brand of levothyroxin.  Ok per Dr. Caron Presume.  Pharmacy has been informed.   ,CMA

## 2021-06-02 DIAGNOSIS — M542 Cervicalgia: Secondary | ICD-10-CM | POA: Diagnosis not present
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
## 2021-06-05 ENCOUNTER — Telehealth (INDEPENDENT_AMBULATORY_CARE_PROVIDER_SITE_OTHER): Payer: Medicare Other | Admitting: Psychiatry

## 2021-06-05 ENCOUNTER — Encounter: Payer: Self-pay | Admitting: Psychiatry

## 2021-06-05 ENCOUNTER — Other Ambulatory Visit: Payer: Self-pay

## 2021-06-05 DIAGNOSIS — F3341 Major depressive disorder, recurrent, in partial remission: Secondary | ICD-10-CM | POA: Diagnosis not present

## 2021-06-05 DIAGNOSIS — F451 Undifferentiated somatoform disorder: Secondary | ICD-10-CM | POA: Diagnosis not present

## 2021-06-05 DIAGNOSIS — F411 Generalized anxiety disorder: Secondary | ICD-10-CM

## 2021-06-05 MED ORDER — DULOXETINE HCL 30 MG PO CPEP: 90.0000 mg | ORAL_CAPSULE | Freq: Every day | ORAL | 1 refills | Status: DC

## 2021-06-06 DIAGNOSIS — M542 Cervicalgia: Secondary | ICD-10-CM | POA: Diagnosis not present

## 2021-06-12 ENCOUNTER — Other Ambulatory Visit: Payer: Self-pay | Admitting: *Deleted

## 2021-06-12 ENCOUNTER — Ambulatory Visit (HOSPITAL_COMMUNITY): Payer: Medicare Other | Admitting: Psychiatry

## 2021-06-12 DIAGNOSIS — J310 Chronic rhinitis: Secondary | ICD-10-CM | POA: Diagnosis not present

## 2021-06-12 DIAGNOSIS — R1013 Epigastric pain: Secondary | ICD-10-CM

## 2021-06-12 DIAGNOSIS — R058 Other specified cough: Secondary | ICD-10-CM | POA: Diagnosis not present

## 2021-06-12 DIAGNOSIS — R21 Rash and other nonspecific skin eruption: Secondary | ICD-10-CM | POA: Diagnosis not present

## 2021-06-12 MED ORDER — OMEPRAZOLE 40 MG PO CPDR: 40.0000 mg | DELAYED_RELEASE_CAPSULE | Freq: Two times a day (BID) | ORAL | 0 refills | Status: DC

## 2021-06-16 ENCOUNTER — Ambulatory Visit (INDEPENDENT_AMBULATORY_CARE_PROVIDER_SITE_OTHER): Payer: Medicare Other | Admitting: Nurse Practitioner

## 2021-06-16 ENCOUNTER — Encounter (HOSPITAL_BASED_OUTPATIENT_CLINIC_OR_DEPARTMENT_OTHER): Payer: Self-pay | Admitting: Nurse Practitioner

## 2021-06-16 ENCOUNTER — Other Ambulatory Visit: Payer: Self-pay

## 2021-06-16 VITALS — BP 115/92 | HR 102 | Ht 61.0 in | Wt 197.8 lb

## 2021-06-16 DIAGNOSIS — J342 Deviated nasal septum: Secondary | ICD-10-CM | POA: Diagnosis not present

## 2021-06-16 DIAGNOSIS — E039 Hypothyroidism, unspecified: Secondary | ICD-10-CM | POA: Diagnosis not present

## 2021-06-16 DIAGNOSIS — M2540 Effusion, unspecified joint: Secondary | ICD-10-CM | POA: Diagnosis not present

## 2021-06-16 DIAGNOSIS — M21611 Bunion of right foot: Secondary | ICD-10-CM | POA: Diagnosis not present

## 2021-06-16 DIAGNOSIS — Z789 Other specified health status: Secondary | ICD-10-CM | POA: Diagnosis not present

## 2021-06-16 DIAGNOSIS — Z0000 Encounter for general adult medical examination without abnormal findings: Secondary | ICD-10-CM | POA: Diagnosis not present

## 2021-06-16 DIAGNOSIS — L710 Perioral dermatitis: Secondary | ICD-10-CM

## 2021-06-16 DIAGNOSIS — M2550 Pain in unspecified joint: Secondary | ICD-10-CM | POA: Diagnosis not present

## 2021-06-16 HISTORY — DX: Encounter for general adult medical examination without abnormal findings: Z00.00

## 2021-06-16 MED ORDER — BETAMETHASONE VALERATE 0.1 % EX CREA: TOPICAL_CREAM | Freq: Two times a day (BID) | CUTANEOUS | 0 refills | Status: DC

## 2021-06-16 NOTE — Assessment & Plan Note (Signed)
Multiple arthralgias and paresthesias presented by patient today mostly on the upper extremities and shoulders. Patient does have a history of fibromyalgia however reports that this is myalgia not arthralgia related. Given the symptoms that she is presenting with today Will monitor for possible autoimmune component that could be exacerbating her symptoms. She does have a periorbital rash, arthralgia, sensitive stomach, and other autoimmune conditions that could indicate possibility of autoimmune component today. Discussed with patient that we will review labs and determine best course of action based on results once they have been received.

## 2021-06-16 NOTE — Assessment & Plan Note (Signed)
Review of current and past medical history, social history, medication, and family history.  Review of care gaps and health maintenance recommendations.  Records from recent providers to be requested if not available in Chart Review or Care Everywhere.  Recommendations for health maintenance, diet, and exercise provided.  Labs today: ordered HM Recommendations: will review records for recommendations CPE due: April 2023
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
## 2021-06-16 NOTE — Assessment & Plan Note (Signed)
Patient endorses gluten-free diet with concerns of gluten allergy. Will obtain labs today to help identify if gluten sensitivity is present. Discussed with the patient if she feels better with a gluten diet that there is no harm in continuing this either way. We will make changes to plan of care based on her lab results as necessary.

## 2021-06-16 NOTE — Assessment & Plan Note (Signed)
Chronic hypothyroidism. Followed by Dr. Loanne Drilling with endocrinology. It is unclear if her globus sensation is related to her hypothyroidism or possibly GERD.  There may be multiple factors that are involved here.  No changes to plan of care today.

## 2021-06-16 NOTE — Assessment & Plan Note (Signed)
Symptoms and presentation of perioral dermatitis present today. It is unclear the etiology of this however does not appear to be an infectious process.  She has tried and failed metronidazole cream recently with no change.  We will test today for possible autoimmune concerns that could be present. Discussed with the patient the option that gluten sensitivity could be a factor in this however it would be very difficult to determine without a GI evaluation.  We will check today for gluten sensitivity on labs. It is unclear if the rash is appearing due to the sun or the sun is just making this worse. Unfortunately the patient is continually picking at the lesions therefore making them larger and opening up the possibility of infection. Will need to monitor this closely.  Strongly recommended the patient avoid picking at the blisters when they form.  We will start with a moderate dose steroid twice a day for the next week to see if this has any improvement on her symptoms. Recommend continue gluten-free diet to see if this makes any changes to the outbreak.

## 2021-06-16 NOTE — Patient Instructions (Signed)
Recommendations from today's visit: We will let you know the results of your tests and what our next steps will be.  If the rash gets any worse then please let me know.   Information on diet, exercise, and health maintenance recommendations are listed below. This is information to help you be sure you are on track for optimal health and monitoring.   Please look over this and let us know if you have any questions or if you have completed any of the health maintenance outside of [place] so that we can be sure your records are up to date.  ___________________________________________________________  Thank you for choosing [place] at [facility] for your Primary Care needs. I am excited for the opportunity to partner with you to meet your health care goals. It was a pleasure meeting you today!  I am an Adult-Geriatric Nurse Practitioner with a background in caring for patients for more than 20 years. I provide primary care and sports medicine services to patients age 85 and older within this office. I am also the director of the APP Fellowship with [facility].   I am passionate about providing the best service to you through preventive medicine and supportive care. I consider you a part of the medical team and value your input. I work diligently to ensure that you are heard and your needs are met in a safe and effective manner. I want you to feel comfortable with me as your provider and want you to know that your health concerns are important to me.  For your information, our office hours are Monday- Friday 8:00 AM - 5:00 PM At this time I am not in the office on Wednesdays.  If you have questions or concerns, please call our office at [phone] or send [place] a MyChart message and we will respond as quickly as possible.   For all urgent or time sensitive needs we ask that you please call the office to avoid delays. MyChart is not constantly monitored and replies may take  up to 72 business hours.  MyChart Policy: MyChart allows for you to see your visit notes, after visit summary, provider recommendations, lab and tests results, make an appointment, request refills, and contact your provider or the office for non-urgent questions or concerns. Providers are seeing patients during normal business hours and do not have built in time to review MyChart messages.  We ask that you allow a minimum of 4 business days for responses to [employer]. For this reason, please do not send urgent requests through [place]. Please call the office at [phone]. Complex MyChart concerns may require a visit. Your provider may request you schedule a virtual or in person visit to ensure we are providing the best care possible. MyChart messages sent after 4:00 PM on Friday will not be received by the provider until Monday morning.    Lab and Test Results: You will receive your lab and test results on MyChart as soon as they are completed and results have been sent by the lab or testing facility. Due to this service, you will receive your results BEFORE your provider.  I review lab and tests results each morning prior to seeing patients. Some results require collaboration with other providers to ensure you are receiving the most appropriate care. For this reason, we ask that you please allow a minimum of 4 business days for your provider to receive and review lab and test results and contact you about these.  Most lab and test result comments from the provider will be sent through [place]. Your provider may recommend changes to the plan of care, follow-up visits, repeat testing, ask questions, or request an office visit to discuss these results. You may reply directly to this message or call the office at [phone] to provide information for the provider or set up an appointment. In some instances, you will be called with test results and recommendations. Please let us know if this is  preferred and we will make note of this in your chart to provide this for you.    If you have not heard a response to your lab or test results in 72 business hours, please call the office to let us know.   After Hours: For all non-emergency after hours needs, please call the office at [phone] and select the option to reach the on-call provider service. On-call services are shared between multiple [place] offices and therefore it will not be possible to speak directly with your provider. On-call providers may provide medical advice and recommendations, but are unable to provide refills for maintenance medications.  For all emergency or urgent medical needs after normal business hours, we recommend that you seek care at the closest Urgent Care or Emergency Department to ensure appropriate treatment in a timely manner.  [facility] [place] has a 24 hour emergency room located on the ground floor for your convenience.    Please do not hesitate to reach out to [place] with concerns.   Thank you, again, for choosing me as your health care partner. I appreciate your trust and look forward to learning more about you.   Worthy Keeler, DNP, AGNP-c ___________________________________________________________  Health Maintenance Recommendations Screening Testing Mammogram Every 1 -2 years based on history and risk factors Starting at age 54 Pap Smear Ages 21-39 every 3 years Ages 65-65 every 5 years with HPV testing More frequent testing may be required based on results and history Colon Cancer Screening Every 1-10 years based on test performed, risk factors, and history Starting at age 86 Bone Density Screening Every 2-10 years based on history Starting at age 37 for women Recommendations for men differ based on medication usage, history, and risk factors AAA Screening One time ultrasound Men 82-3 years old who have every smoked Lung Cancer Screening Low Dose Lung CT  every 12 months Age 63-80 years with a 30 pack-year smoking history who still smoke or who have quit within the last 15 years  Screening Labs Routine  Labs: Complete Blood Count (CBC), Complete Metabolic Panel (CMP), Cholesterol (Lipid Panel) Every 6-12 months based on history and medications May be recommended more frequently based on current conditions or previous results Hemoglobin A1c Lab Every 3-12 months based on history and previous results Starting at age 40 or earlier with diagnosis of diabetes, high cholesterol, BMI >26, and/or risk factors Frequent monitoring for patients with diabetes to ensure blood sugar control Thyroid Panel (TSH w/ T3 & T4) Every 6 months based on history, symptoms, and risk factors May be repeated more often if on medication HIV One time testing for all patients 16 and older May be repeated more frequently for patients with increased risk factors or exposure Hepatitis C One time testing for all patients 53 and older May be repeated more frequently for patients with increased risk factors or exposure Gonorrhea, Chlamydia Every 12 months for all sexually active persons 13-24 years Additional monitoring may be recommended for those who are considered high  risk or who have symptoms PSA Men 12-6 years old with risk factors Additional screening may be recommended from age 42-69 based on risk factors, symptoms, and history  Vaccine Recommendations Tetanus Booster All adults every 10 years Flu Vaccine All patients 6 months and older every year COVID Vaccine All patients 12 years and older Initial dosing with booster May recommend additional booster based on age and health history HPV Vaccine 2 doses all patients age 65-26 Dosing may be considered for patients over 26 Shingles Vaccine (Shingrix) 2 doses all adults 23 years and older Pneumonia (Pneumovax 23) All adults 90 years and older May recommend earlier dosing based on health history Pneumonia  (Prevnar 32) All adults 87 years and older Dosed 1 year after Pneumovax 23  Additional Screening, Testing, and Vaccinations may be recommended on an individualized basis based on family history, health history, risk factors, and/or exposure.  __________________________________________________________  Diet Recommendations for All Patients  I recommend that all patients maintain a diet low in saturated fats, carbohydrates, and cholesterol. While this can be challenging at first, it is not impossible and small changes can make big differences.  Things to try: Decreasing the amount of soda, sweet tea, and/or juice to one or less per day and replace with water While water is always the first choice, if you do not like water you may consider adding a water additive without sugar to improve the taste other sugar free drinks Replace potatoes with a brightly colored vegetable at dinner Use healthy oils, such as canola oil or olive oil, instead of butter or hard margarine Limit your bread intake to two pieces or less a day Replace regular pasta with low carb pasta options Bake, broil, or grill foods instead of frying Monitor portion sizes  Eat smaller, more frequent meals throughout the day instead of large meals  An important thing to remember is, if you love foods that are not great for your health, you don't have to give them up completely. Instead, allow these foods to be a reward when you have done well. Allowing yourself to still have special treats every once in a while is a nice way to tell yourself thank you for working hard to keep yourself healthy.   Also remember that every day is a new day. If you have a bad day and "fall off the wagon", you can still climb right back up and keep moving along on your journey!  We have resources available to help you!  Some websites that may be helpful  include: www.http://carter.biz/  Www.[employer]Well[employer].com _____________________________________________________________  Activity Recommendations for All Patients  I recommend that all adults get at least 20 minutes of moderate physical activity that elevates your heart rate at least 5 days out of the week.  Some examples include: Walking or jogging at a pace that allows you to carry on a conversation Cycling (stationary bike or outdoors) Water aerobics Yoga Weight lifting Dancing If physical limitations prevent you from putting stress on your joints, exercise in a pool or seated in a chair are excellent options.  Do determine your MAXIMUM heart rate for activity: YOUR AGE - 220 = MAX HeartRate   Remember! Do not push yourself too hard.  Start slowly and build up your pace, speed, weight, time in exercise, etc.  Allow your body to rest between exercise and get good sleep. You will need more water than normal when you are exerting yourself. Do not wait until you are thirsty to drink. Drink with  a purpose of getting in at least 8, 8 ounce glasses of water a day plus more depending on how much you exercise and sweat.    If you begin to develop dizziness, chest pain, abdominal pain, jaw pain, shortness of breath, headache, vision changes, lightheadedness, or other concerning symptoms, stop the activity and allow your body to rest. If your symptoms are severe, seek emergency evaluation immediately. If your symptoms are concerning, but not severe, please let us know so that we can recommend further evaluation.   ________________________________________________________________

## 2021-06-16 NOTE — Assessment & Plan Note (Signed)
Nasal septal deviation noted to the right.  There is rhinorrhea present within the nasal cavity however no evidence of boggy or pale mucous membranes. Recent allergy testing revealed limited because of allergies.  Review of notes from allergist reports suspected septal deviation being because of chronic rhinitis. Discussed with the patient that she should contact ENT who she has seen recently and discussed option of treatment recommendations for septal deviation. She will let me know if she is unable to get in touch with them or needs a new referral.

## 2021-06-16 NOTE — Assessment & Plan Note (Signed)
Mild enlargement of the PIP to the right hallux noted.  Patient reports that this does bother her.  We will send referral to podiatry for further evaluation.

## 2021-06-18 ENCOUNTER — Encounter: Payer: Self-pay | Admitting: Podiatry

## 2021-06-18 ENCOUNTER — Other Ambulatory Visit: Payer: Self-pay

## 2021-06-18 ENCOUNTER — Ambulatory Visit (INDEPENDENT_AMBULATORY_CARE_PROVIDER_SITE_OTHER): Payer: Medicare Other

## 2021-06-18 ENCOUNTER — Ambulatory Visit (INDEPENDENT_AMBULATORY_CARE_PROVIDER_SITE_OTHER): Payer: Medicare Other | Admitting: Podiatry

## 2021-06-18 DIAGNOSIS — M21619 Bunion of unspecified foot: Secondary | ICD-10-CM

## 2021-06-18 DIAGNOSIS — M21612 Bunion of left foot: Secondary | ICD-10-CM

## 2021-06-18 DIAGNOSIS — M21611 Bunion of right foot: Secondary | ICD-10-CM | POA: Diagnosis not present

## 2021-06-18 DIAGNOSIS — M205X1 Other deformities of toe(s) (acquired), right foot: Secondary | ICD-10-CM | POA: Diagnosis not present

## 2021-06-18 NOTE — Progress Notes (Signed)
Subjective:   Patient ID: Laura Burnett, female   DOB: 55 y.o.   MRN: [id number]   HPI Patient has a lot of pain in her bunion deformity right and states that she is lost some motion in the joint and that its been sore and makes walking difficult.  Patient states she is tried wider shoes she is tried soaks without relief and it is gradually become more of an issue for her and patient does not smoke would like to be more active   ROS      Objective:  Physical Exam Vitals and nursing note reviewed.  Constitutional:      Appearance: She is well-developed.  Pulmonary:     Effort: Pulmonary effort is normal.  Musculoskeletal:        General: Normal range of motion.  Skin:    General: Skin is warm.  Neurological:     Mental Status: She is alert.    Neurovascular status was found to be intact muscle strength was found to be adequate range of motion adequate.  Patient does have some loss of motion and a functional situation of the right first MPJ with mild prominence of the bone redness and pain with palpation.  Patient is found to have good digital perfusion well oriented x3     Assessment:  Structural HAV hallux limitus deformity right with pain     Plan:  H&P reviewed condition and recommended at this point due to discomfort and failure to respond conservatively a shortening plantarflexed ray osteotomy first metatarsal right with removal of bump off the medial side.  Patient wants surgery and needs to do it soon and I allowed her to read consent form for correction reviewing the procedure risk and the fact that total recovery can take 6 months to 1 year and ultimately could require fusion or implantation procedure.  Patient wants all this understands and signed consent form and is scheduled for outpatient surgery  X-rays indicate elongated first metatarsal segment right over left elevated first metatarsal segment right with moderate elevation of the intermetatarsal angle

## 2021-06-19 DIAGNOSIS — M542 Cervicalgia: Secondary | ICD-10-CM | POA: Diagnosis not present

## 2021-06-19 LAB — ANA+ENA+DNA/DS+SCL 70+SJOSSA/B
ANA Titer 1: NEGATIVE
ENA RNP Ab: 0.3 AI (ref 0.0–0.9)
ENA SM Ab Ser-aCnc: <0.2 AI (ref 0.0–0.9)
ENA SSA (RO) Ab: <0.2 AI (ref 0.0–0.9)
ENA SSB (LA) Ab: <0.2 AI (ref 0.0–0.9)
Scleroderma (Scl-70) (ENA) Antibody, IgG: 0.2 AI (ref 0.0–0.9)
dsDNA Ab: <1 IU/mL (ref 0–9)

## 2021-06-19 LAB — F004-IGE WHEAT: Wheat IgE: <0.1 kU/L

## 2021-06-19 LAB — GLUTEN SENSITIVITY SCREEN: tTG/DGP Screen: NEGATIVE

## 2021-06-19 LAB — NOTE:

## 2021-06-19 LAB — ANTIGLIADIN IGG (NATIVE): Antigliadin IgG (native): 4 units (ref 0–19)

## 2021-06-19 LAB — RHEUMATOID FACTOR: Rheumatoid fact SerPl-aCnc: 11.7 IU/mL (ref <14.0)

## 2021-06-20 ENCOUNTER — Telehealth: Payer: Self-pay | Admitting: Urology

## 2021-06-20 NOTE — Telephone Encounter (Signed)
DOS - 07/01/21  [place] BUNIONECTOMY RIGHT --- [phone]  [facility] EFFECTIVE DATE - 02/28/21  PLAN DEDUCTIBLE - $0.00 OUT OF POCKET - $7,550.00 W/ $7,550.00 REMAINING COINSURANCE - 20% COPAY - $0.00   PER [employer] WEB SITE FOR CPT CODE 58251 Notification or Prior Authorization is not required for the requested services   Decision ID #:[id number]

## 2021-06-24 DIAGNOSIS — S233XXA Sprain of ligaments of thoracic spine, initial encounter: Secondary | ICD-10-CM | POA: Diagnosis not present

## 2021-06-24 DIAGNOSIS — S134XXA Sprain of ligaments of cervical spine, initial encounter: Secondary | ICD-10-CM | POA: Diagnosis not present

## 2021-06-24 DIAGNOSIS — M9902 Segmental and somatic dysfunction of thoracic region: Secondary | ICD-10-CM | POA: Diagnosis not present

## 2021-06-24 DIAGNOSIS — M9901 Segmental and somatic dysfunction of cervical region: Secondary | ICD-10-CM | POA: Diagnosis not present

## 2021-06-24 DIAGNOSIS — M9903 Segmental and somatic dysfunction of lumbar region: Secondary | ICD-10-CM | POA: Diagnosis not present

## 2021-06-24 DIAGNOSIS — S338XXA Sprain of other parts of lumbar spine and pelvis, initial encounter: Secondary | ICD-10-CM | POA: Diagnosis not present

## 2021-06-24 DIAGNOSIS — M47816 Spondylosis without myelopathy or radiculopathy, lumbar region: Secondary | ICD-10-CM | POA: Diagnosis not present

## 2021-06-26 ENCOUNTER — Ambulatory Visit (INDEPENDENT_AMBULATORY_CARE_PROVIDER_SITE_OTHER): Payer: Medicare Other | Admitting: Psychiatry

## 2021-06-26 ENCOUNTER — Other Ambulatory Visit: Payer: Self-pay

## 2021-06-26 DIAGNOSIS — F411 Generalized anxiety disorder: Secondary | ICD-10-CM

## 2021-06-26 DIAGNOSIS — F3341 Major depressive disorder, recurrent, in partial remission: Secondary | ICD-10-CM | POA: Diagnosis not present
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
## 2021-06-27 ENCOUNTER — Other Ambulatory Visit (HOSPITAL_BASED_OUTPATIENT_CLINIC_OR_DEPARTMENT_OTHER): Payer: Self-pay

## 2021-06-27 ENCOUNTER — Other Ambulatory Visit (HOSPITAL_BASED_OUTPATIENT_CLINIC_OR_DEPARTMENT_OTHER): Payer: Self-pay | Admitting: Nurse Practitioner

## 2021-06-27 DIAGNOSIS — E782 Mixed hyperlipidemia: Secondary | ICD-10-CM

## 2021-06-27 DIAGNOSIS — M5416 Radiculopathy, lumbar region: Secondary | ICD-10-CM

## 2021-06-27 DIAGNOSIS — M65321 Trigger finger, right index finger: Secondary | ICD-10-CM

## 2021-06-27 DIAGNOSIS — G5601 Carpal tunnel syndrome, right upper limb: Secondary | ICD-10-CM

## 2021-06-27 DIAGNOSIS — M542 Cervicalgia: Secondary | ICD-10-CM

## 2021-06-27 DIAGNOSIS — I2510 Atherosclerotic heart disease of native coronary artery without angina pectoris: Secondary | ICD-10-CM

## 2021-06-27 MED ORDER — CYCLOBENZAPRINE HCL 10 MG PO TABS: 10.0000 mg | ORAL_TABLET | Freq: Three times a day (TID) | ORAL | 0 refills | Status: DC | PRN

## 2021-06-27 MED ORDER — DULOXETINE HCL 60 MG PO CPEP: 60.0000 mg | ORAL_CAPSULE | Freq: Every day | ORAL | 0 refills | Status: DC

## 2021-06-27 MED ORDER — ATORVASTATIN CALCIUM 40 MG PO TABS: 40.0000 mg | ORAL_TABLET | Freq: Every day | ORAL | 3 refills | Status: DC

## 2021-06-27 NOTE — Progress Notes (Signed)
Patient called to request refill on cyclobenzaprine for muscle spasms. Chart review reveals patient has been on this medication consistently for several years. She is currently in PT and has a long standing history of msk pain. Concern is present for daily use with other medications that can cause sedation including gabapentin daily, and valium PRN.  Refill provided, but recommend working to taper to twice a day maximum use to help reduce the risks associated with sedative medications and advancement in age.  Goal to help taper while continuing to manage pain appropriately over time. Cyclobenzaprine and valium both not recommended for adults over the age of 38, therefore, we can hopefully reduce use over time to help meet the recommendation to avoid these medications by the time she reaches this age group.   Refill also provided on duloxetine today, as it appears that this is due and should help to manage her pain in a safer manner.

## 2021-06-30 ENCOUNTER — Telehealth: Payer: Self-pay | Admitting: *Deleted

## 2021-06-30 MED ORDER — HYDROMORPHONE HCL 4 MG PO TABS: 4.0000 mg | ORAL_TABLET | ORAL | 0 refills | Status: AC | PRN

## 2021-06-30 NOTE — Addendum Note (Signed)
Addended by: Wallene Huh on: 06/30/2021 01:49 PM   Modules accepted: Orders

## 2021-06-30 NOTE — Telephone Encounter (Signed)
[facility] is calling to let the doctor know that they received prescription request for Hydromorphone (is a high dosage)and that the patient is currently taking diazepam-2 mg tablet as well.

## 2021-07-01 ENCOUNTER — Encounter: Payer: Self-pay | Admitting: Podiatry

## 2021-07-01 DIAGNOSIS — M2011 Hallux valgus (acquired), right foot: Secondary | ICD-10-CM | POA: Diagnosis not present

## 2021-07-01 DIAGNOSIS — M2021 Hallux rigidus, right foot: Secondary | ICD-10-CM | POA: Diagnosis not present

## 2021-07-02 NOTE — Telephone Encounter (Signed)
That's fine. She had surgery yesterday

## 2021-07-04 ENCOUNTER — Telehealth: Payer: Self-pay | Admitting: *Deleted

## 2021-07-04 NOTE — Telephone Encounter (Signed)
Patient is calling because the medicine given is causing upset stomach,nausea,will discontinue and start taking ibuprofen. Please advise.

## 2021-07-07 ENCOUNTER — Other Ambulatory Visit: Payer: Self-pay

## 2021-07-07 ENCOUNTER — Ambulatory Visit (INDEPENDENT_AMBULATORY_CARE_PROVIDER_SITE_OTHER): Payer: Medicare Other

## 2021-07-07 ENCOUNTER — Encounter: Payer: Self-pay | Admitting: Podiatry

## 2021-07-07 ENCOUNTER — Ambulatory Visit (INDEPENDENT_AMBULATORY_CARE_PROVIDER_SITE_OTHER): Payer: Medicare Other | Admitting: Podiatry

## 2021-07-07 DIAGNOSIS — M21619 Bunion of unspecified foot: Secondary | ICD-10-CM

## 2021-07-07 DIAGNOSIS — Z9889 Other specified postprocedural states: Secondary | ICD-10-CM | POA: Diagnosis not present

## 2021-07-07 NOTE — Telephone Encounter (Signed)
Returned the call to patient and gave recommendations per Dr Paulla Dolly that it is ok to discontinue the medicine and to take ibuprofren prn. She verbalized understanding.

## 2021-07-07 NOTE — Telephone Encounter (Signed)
That is fine to do

## 2021-07-07 NOTE — Progress Notes (Signed)
Subjective:   Patient ID: Laura Burnett, female   DOB: 54 y.o.   MRN: [id number]   HPI Patient presents stating she is doing very well with surgery very pleased with minimal discomfort no   ROS      Objective:  Physical Exam  Neurovascular status intact incision site healing very well wound edges well coapted good range of motion no crepitus     Assessment:  Doing well post shortening osteotomy first metatarsal right     Plan:  H&P x-ray reviewed begin gradual surgical shoe usage with continued boot usage along with immobilization elevation compression and reappoint to recheck in the next 3 weeks or earlier if needed  X-rays indicated that there is good healing of the osteotomy fixation in place excellent shortening plantarflexion negative Homans' sign noted

## 2021-07-08 ENCOUNTER — Ambulatory Visit (HOSPITAL_COMMUNITY): Payer: Medicare Other | Admitting: Psychiatry

## 2021-07-17 ENCOUNTER — Telehealth: Payer: Self-pay | Admitting: Physician Assistant

## 2021-07-17 ENCOUNTER — Other Ambulatory Visit: Payer: Self-pay

## 2021-07-17 ENCOUNTER — Ambulatory Visit: Payer: Medicare Other | Admitting: Orthopaedic Surgery

## 2021-07-17 ENCOUNTER — Telehealth: Payer: Medicare Other | Admitting: Physician Assistant

## 2021-07-17 ENCOUNTER — Encounter: Payer: Self-pay | Admitting: Physician Assistant

## 2021-07-17 DIAGNOSIS — J320 Chronic maxillary sinusitis: Secondary | ICD-10-CM

## 2021-07-17 DIAGNOSIS — H9201 Otalgia, right ear: Secondary | ICD-10-CM | POA: Diagnosis not present

## 2021-07-17 MED ORDER — AMOXICILLIN-POT CLAVULANATE 875-125 MG PO TABS: 1.0000 | ORAL_TABLET | Freq: Two times a day (BID) | ORAL | 0 refills | Status: DC

## 2021-07-17 NOTE — Patient Instructions (Addendum)
Laura Burnett, thank you for joining [name], PA-C for today's virtual visit.  While this provider is not your primary care provider (PCP), if your PCP is located in our provider database this encounter information will be shared with them immediately following your visit.  Consent: (Patient) Laura Burnett provided verbal consent for this virtual visit at the beginning of the encounter.  Current Medications:  Current Outpatient Medications:    albuterol (VENTOLIN HFA) 108 (90 Base) MCG/ACT inhaler, INHALE 2 PUFFS BY MOUTH EVERY 4 HOURS AS NEEDED FOR WHEEZING AND FOR SHORTNESS OF BREATH, Disp: 18 g, Rfl: 0   atorvastatin (LIPITOR) 40 MG tablet, Take 1 tablet (40 mg total) by mouth daily., Disp: 90 tablet, Rfl: 3   benzoyl peroxide-erythromycin (BENZAMYCIN) gel, Apply topically 2 (two) times daily., Disp: 46.6 g, Rfl: 0   betamethasone valerate (VALISONE) 0.1 % cream, Apply topically 2 (two) times daily., Disp: 30 g, Rfl: 0   cyclobenzaprine (FLEXERIL) 10 MG tablet, Take 1 tablet (10 mg total) by mouth 3 (three) times daily as needed for muscle spasms. Work to taper to twice a day maximum., Disp: 270 tablet, Rfl: 0   diazepam (VALIUM) 2 MG tablet, Take 1 tablet by mouth daily as needed., Disp: , Rfl:    docusate sodium (COLACE) 100 MG capsule, Take 100 mg by mouth 2 (two) times daily., Disp: , Rfl:    DULoxetine (CYMBALTA) 60 MG capsule, Take 1 capsule (60 mg total) by mouth daily., Disp: 30 capsule, Rfl: 0   fluticasone (FLONASE) 50 MCG/ACT nasal spray, Place 2 sprays into both nostrils daily., Disp: 16 g, Rfl: 6   gabapentin (NEURONTIN) 800 MG tablet, Take 1 tablet (800 mg total) by mouth 3 (three) times daily., Disp: 180 tablet, Rfl: 0   ipratropium (ATROVENT) 0.03 % nasal spray, Place into both nostrils., Disp: , Rfl:    levocetirizine (XYZAL) 5 MG tablet, Take 1 tablet (5 mg total) by mouth every evening., Disp: 30 tablet, Rfl: 2   levothyroxine (EUTHYROX) 100 MCG tablet, Take 1  tablet (100 mcg total) by mouth daily., Disp: 90 tablet, Rfl: 0   omeprazole (PRILOSEC) 40 MG capsule, Take 1 capsule (40 mg total) by mouth 2 (two) times daily., Disp: 180 capsule, Rfl: 0   Medications ordered in this encounter:  No orders of the defined types were placed in this encounter.    *If you need refills on other medications prior to your next appointment, please contact your pharmacy*  Follow-Up: Call back or seek an in-person evaluation if the symptoms worsen or if the condition fails to improve as anticipated.  Other Instructions Please take antibiotic as directed.  Increase fluid intake.  Use Saline nasal spray.  Take a daily multivitamin. Continue your allergy medication regimen.  You can take the Mucinex-D OTC as discussed. Place a humidifier in the bedroom.  Please call or return clinic if symptoms are not improving.  Sinusitis Sinusitis is redness, soreness, and swelling (inflammation) of the paranasal sinuses. Paranasal sinuses are air pockets within the bones of your face (beneath the eyes, the middle of the forehead, or above the eyes). In healthy paranasal sinuses, mucus is able to drain out, and air is able to circulate through them by way of your nose. However, when your paranasal sinuses are inflamed, mucus and air can become trapped. This can allow bacteria and other germs to grow and cause infection. Sinusitis can develop quickly and last only a short time (acute) or continue over  a long period (chronic). Sinusitis that lasts for more than 12 weeks is considered chronic.  CAUSES  Causes of sinusitis include: Allergies. Structural abnormalities, such as displacement of the cartilage that separates your nostrils (deviated septum), which can decrease the air flow through your nose and sinuses and affect sinus drainage. Functional abnormalities, such as when the small hairs (cilia) that line your sinuses and help remove mucus do not work properly or are not  present. SYMPTOMS  Symptoms of acute and chronic sinusitis are the same. The primary symptoms are pain and pressure around the affected sinuses. Other symptoms include: Upper toothache. Earache. Headache. Bad breath. Decreased sense of smell and taste. A cough, which worsens when you are lying flat. Fatigue. Fever. Thick drainage from your nose, which often is green and may contain pus (purulent). Swelling and warmth over the affected sinuses. DIAGNOSIS  Your caregiver will perform a physical exam. During the exam, your caregiver may: Look in your nose for signs of abnormal growths in your nostrils (nasal polyps). Tap over the affected sinus to check for signs of infection. View the inside of your sinuses (endoscopy) with a special imaging device with a light attached (endoscope), which is inserted into your sinuses. If your caregiver suspects that you have chronic sinusitis, one or more of the following tests may be recommended: Allergy tests. Nasal culture A sample of mucus is taken from your nose and sent to a lab and screened for bacteria. Nasal cytology A sample of mucus is taken from your nose and examined by your caregiver to determine if your sinusitis is related to an allergy. TREATMENT  Most cases of acute sinusitis are related to a viral infection and will resolve on their own within 10 days. Sometimes medicines are prescribed to help relieve symptoms (pain medicine, decongestants, nasal steroid sprays, or saline sprays).  However, for sinusitis related to a bacterial infection, your caregiver will prescribe antibiotic medicines. These are medicines that will help kill the bacteria causing the infection.  Rarely, sinusitis is caused by a fungal infection. In theses cases, your caregiver will prescribe antifungal medicine. For some cases of chronic sinusitis, surgery is needed. Generally, these are cases in which sinusitis recurs more than 3 times per year, despite other  treatments. HOME CARE INSTRUCTIONS  Drink plenty of water. Water helps thin the mucus so your sinuses can drain more easily. Use a humidifier. Inhale steam 3 to 4 times a day (for example, sit in the bathroom with the shower running). Apply a warm, moist washcloth to your face 3 to 4 times a day, or as directed by your caregiver. Use saline nasal sprays to help moisten and clean your sinuses. Take over-the-counter or prescription medicines for pain, discomfort, or fever only as directed by your caregiver. SEEK IMMEDIATE MEDICAL CARE IF: You have increasing pain or severe headaches. You have nausea, vomiting, or drowsiness. You have swelling around your face. You have vision problems. You have a stiff neck. You have difficulty breathing. MAKE SURE YOU:  Understand these instructions. Will watch your condition. Will get help right away if you are not doing well or get worse. Document Released: 08/17/2005 Document Revised: 11/09/2011 Document Reviewed: 09/01/2011 [facility] Patient Information 2014 [place], [place].  If you have been instructed to have an in-person evaluation today at a local Urgent Care facility, please use the link below. It will take you to a list of all of our available [facility], including address, phone number and hours of operation.  Please do not delay care.  [facility]  If you or a family member do not have a primary care provider, use the link below to schedule a visit and establish care. When you choose a [facility] primary care physician or advanced practice provider, you gain a long-term partner in health. Find a Primary Care Provider  Learn more about [facility]'s in-office and virtual care options: [facility]

## 2021-07-17 NOTE — Telephone Encounter (Signed)
Pt states that he medication wasn't sent in can you send them in for her.

## 2021-07-17 NOTE — Progress Notes (Signed)
Virtual Visit Consent   Laura Burnett, you are scheduled for a virtual visit with a [facility] provider today.     Just as with appointments in the office, your consent must be obtained to participate.  Your consent will be active for this visit and any virtual visit you may have with one of our providers in the next 365 days.     If you have a MyChart account, a copy of this consent can be sent to you electronically.  All virtual visits are billed to your insurance company just like a traditional visit in the office.    As this is a virtual visit, video technology does not allow for your provider to perform a traditional examination.  This may limit your provider's ability to fully assess your condition.  If your provider identifies any concerns that need to be evaluated in person or the need to arrange testing (such as labs, EKG, etc.), we will make arrangements to do so.     Although advances in technology are sophisticated, we cannot ensure that it will always work on either your end or our end.  If the connection with a video visit is poor, the visit may have to be switched to a telephone visit.  With either a video or telephone visit, we are not always able to ensure that we have a secure connection.     I need to obtain your verbal consent now.   Are you willing to proceed with your visit today?    Laura Burnett has provided verbal consent on 07/17/2021 for a virtual visit (video or telephone).   [name], [place]   Date: 07/17/2021 11:36 AM   Virtual Visit via Video Note   I, [name], connected with  Laura Burnett  ([id number], Mar 04, 1966) on 07/17/21 at 11:30 AM EST by a video-enabled telemedicine application and verified that I am speaking with the correct person using two identifiers.  Location: Patient: Virtual Visit Location Patient: Home Provider: Virtual Visit Location Provider: Home Office   I discussed the limitations of evaluation and management by  telemedicine and the availability of in person appointments. The patient expressed understanding and agreed to proceed.    History of Present Illness: Laura Burnett is a 55 y.o. who identifies as a female who was assigned female at birth, and is being seen today for significant R ear pain x 3 days. Has been having some ongoing issue with chronic rhinitis, followed by Allergist and is on a multiple allergy-drug regimen. Then over the past few days, having sharp pains in the R ear with some aching. Symptoms are all R-sided., now with R maxillary pain and pressure as well. Denies any known fever or any chills, aches. Denies any chest pain or SOB.  HPI: HPI  Problems:  Patient Active Problem List   Diagnosis Date Noted   Gluten free diet 06/16/2021   Nasal septal deviation 06/16/2021   Bunion of great toe of right foot 06/16/2021   Encounter for medical examination to establish care 06/16/2021   Arthralgia 06/16/2021   History of lumbar spinal fusion 05/22/2021   Gout 05/02/2021   Hypothyroidism 04/17/2021   Perioral dermatitis 03/12/2021   Globus sensation 01/10/2021   Nasal congestion 12/04/2020   CAD (coronary artery disease) 07/16/2020   OSA (obstructive sleep apnea) 07/02/2020   Bronchiectasis without complication ([place]) 78/29/5621   Seasonal allergies 30/86/5784   Lichen simplex chronicus 01/15/2020   Verruca 01/15/2020  Trigger finger, right index finger 11/17/2019   Hyperlipidemia 11/15/2019   Elevated alkaline phosphatase level 10/12/2019   Abdominal pain, acute, epigastric 07/21/2019   Carpal tunnel syndrome, right upper limb 12/16/2018   Thyromegaly 10/18/2018   Adjustment disorder with depressed mood 05/24/2017   Anxiety disorder 03/16/2017   Lumbar radiculopathy 12/21/2016   Neck pain 12/20/2015    Allergies:  Allergies  Allergen Reactions   Ciprofloxacin Nausea And Vomiting   Phentermine Other (See Comments)    Tearing, lightheadedness, depression   Tape Other  (See Comments)    " I get red and itchy." Paper tape okay   Percocet [Oxycodone-Acetaminophen] Hives and Rash   Tetanus Toxoids Nausea And Vomiting   Tylenol With Codeine #3 [Acetaminophen-Codeine] Rash   Medications:  Current Outpatient Medications:    albuterol (VENTOLIN HFA) 108 (90 Base) MCG/ACT inhaler, INHALE 2 PUFFS BY MOUTH EVERY 4 HOURS AS NEEDED FOR WHEEZING AND FOR SHORTNESS OF BREATH, Disp: 18 g, Rfl: 0   atorvastatin (LIPITOR) 40 MG tablet, Take 1 tablet (40 mg total) by mouth daily., Disp: 90 tablet, Rfl: 3   benzoyl peroxide-erythromycin (BENZAMYCIN) gel, Apply topically 2 (two) times daily., Disp: 46.6 g, Rfl: 0   betamethasone valerate (VALISONE) 0.1 % cream, Apply topically 2 (two) times daily., Disp: 30 g, Rfl: 0   cyclobenzaprine (FLEXERIL) 10 MG tablet, Take 1 tablet (10 mg total) by mouth 3 (three) times daily as needed for muscle spasms. Work to taper to twice a day maximum., Disp: 270 tablet, Rfl: 0   diazepam (VALIUM) 2 MG tablet, Take 1 tablet by mouth daily as needed., Disp: , Rfl:    docusate sodium (COLACE) 100 MG capsule, Take 100 mg by mouth 2 (two) times daily., Disp: , Rfl:    DULoxetine (CYMBALTA) 60 MG capsule, Take 1 capsule (60 mg total) by mouth daily., Disp: 30 capsule, Rfl: 0   fluticasone (FLONASE) 50 MCG/ACT nasal spray, Place 2 sprays into both nostrils daily., Disp: 16 g, Rfl: 6   gabapentin (NEURONTIN) 800 MG tablet, Take 1 tablet (800 mg total) by mouth 3 (three) times daily., Disp: 180 tablet, Rfl: 0   ipratropium (ATROVENT) 0.03 % nasal spray, Place into both nostrils., Disp: , Rfl:    levocetirizine (XYZAL) 5 MG tablet, Take 1 tablet (5 mg total) by mouth every evening., Disp: 30 tablet, Rfl: 2   levothyroxine (EUTHYROX) 100 MCG tablet, Take 1 tablet (100 mcg total) by mouth daily., Disp: 90 tablet, Rfl: 0   omeprazole (PRILOSEC) 40 MG capsule, Take 1 capsule (40 mg total) by mouth 2 (two) times daily., Disp: 180 capsule, Rfl:  0  Observations/Objective: Patient is well-developed, well-nourished in no acute distress.  Resting comfortably at home.  Head is normocephalic, atraumatic.  No labored breathing. Speech is clear and coherent with logical content.  Patient is alert and oriented at baseline.   Assessment and Plan: 1. Right maxillary sinusitis 2. Acute otalgia, right Rx Augmentin.  Increase fluids.  Rest.  Saline nasal spray.  Probiotic.  Mucinex as directed.  Humidifier in bedroom. Continue allergy medication regimen.  Call or return to clinic if symptoms are not improving.  Follow Up Instructions: I discussed the assessment and treatment plan with the patient. The patient was provided an opportunity to ask questions and all were answered. The patient agreed with the plan and demonstrated an understanding of the instructions.  A copy of instructions were sent to the patient via MyChart unless otherwise noted below.    The  patient was advised to call back or seek an in-person evaluation if the symptoms worsen or if the condition fails to improve as anticipated.  Time:  I spent 10 minutes with the patient via telehealth technology discussing the above problems/concerns.    [name], PA-C

## 2021-07-17 NOTE — Addendum Note (Signed)
Addended by: Brunetta Jeans on: 07/17/2021 02:09 PM   Modules accepted: Orders

## 2021-07-17 NOTE — Telephone Encounter (Signed)
Medication resubmitted. MyChart message sent to patient.

## 2021-07-22 ENCOUNTER — Other Ambulatory Visit: Payer: Self-pay

## 2021-07-22 ENCOUNTER — Ambulatory Visit (INDEPENDENT_AMBULATORY_CARE_PROVIDER_SITE_OTHER): Payer: Medicare Other | Admitting: Psychiatry

## 2021-07-22 DIAGNOSIS — F411 Generalized anxiety disorder: Secondary | ICD-10-CM

## 2021-07-22 DIAGNOSIS — F3341 Major depressive disorder, recurrent, in partial remission: Secondary | ICD-10-CM

## 2021-07-22 NOTE — Progress Notes (Signed)
Virtual Visit via Video Note  I connected with Laura Burnett on 07/22/21 at 1:10 PM EST by a video enabled telemedicine application and verified that I am speaking with the correct person using two identifiers.  Location: Patient: Home Provider: [place] office   I discussed the limitations of evaluation and management by telemedicine and the availability of in person appointments. The patient expressed understanding and agreed to proceed.  I provided 41 minutes of non-face-to-face time during this encounter.   Alonza Smoker, LCSW   THERAPIST PROGRESS NOTE  Session Time: Tuesday 07/21/2021 1:10 PM - 1:51 PM   Participation Level: Active  Behavioral Response: CasualAlert/anxious  Type of Therapy: Individual Therapy  Treatment Goals addressed: Anxiety  Interventions: CBT  Summary: Laura Burnett is a 55 y.o. female who is referred for services by psychiatrist Dr. Modesta Messing due to patient's.  Symptoms of depression and anxiety.  She denies any psychiatric hospitalizations.  She participated in outpatient therapy in this practice with Vonna Kotyk sheets and last was seen in 2019.  Patient states needing help with anxiety as she becomes stressed and irritated very quickly.  She reports difficulty being around people and states avoiding crowds.  Patient last was seen via virtual visit about 4 weeks ago.  She reports significant reduction in anxiety since last session. She recently had foot surgery and has been confined to her home. She feels safe at home and has not worried about going places.  She anticipates she will be able to drive in about 2 weeks.  She also feels more optimistic as dx and surgery helped pt to understand problems she experienced earlier in life per her report. She expresses relief to know there was a physical reason for some of her problems.  Patient reports recovery from surgery is going well.  She has strong support from her son and her sister.  She is  looking forward to having Thanksgiving dinner with her family at her home.  She reports improved motivation and has been scheduling activities she can perform while sitting on the couch.  She also reports she has been practicing deep breathing regularly as well as using it as an intervention.   Suicidal/Homicidal: Nowithout intent/plan  Therapist Response: Reviewed symptoms, administered GAD-7, discussed stressors, facilitated expression of thoughts and feelings, validated feelings, praised and reinforced patient's improved daily structure/routine, praised and reinforced patient's practice of deep breathing, discussed effects, develop plan with patient to continue practicing relaxation technique daily, discussed next steps for treatment, also began to introduce exposure therapy  Plan: Return again in 2 weeks.  Diagnosis: Axis I: Major depressive disorder, recurrent episode, mild        Alonza Smoker, LCSW 07/22/2021

## 2021-07-23 ENCOUNTER — Other Ambulatory Visit: Payer: Self-pay | Admitting: Family Medicine

## 2021-07-29 ENCOUNTER — Other Ambulatory Visit: Payer: Self-pay | Admitting: Family Medicine

## 2021-07-29 DIAGNOSIS — S338XXA Sprain of other parts of lumbar spine and pelvis, initial encounter: Secondary | ICD-10-CM | POA: Diagnosis not present

## 2021-07-29 DIAGNOSIS — J301 Allergic rhinitis due to pollen: Secondary | ICD-10-CM | POA: Diagnosis not present

## 2021-07-29 DIAGNOSIS — S233XXA Sprain of ligaments of thoracic spine, initial encounter: Secondary | ICD-10-CM | POA: Diagnosis not present

## 2021-07-29 DIAGNOSIS — M9903 Segmental and somatic dysfunction of lumbar region: Secondary | ICD-10-CM | POA: Diagnosis not present

## 2021-07-29 DIAGNOSIS — M9901 Segmental and somatic dysfunction of cervical region: Secondary | ICD-10-CM | POA: Diagnosis not present

## 2021-07-29 DIAGNOSIS — S134XXA Sprain of ligaments of cervical spine, initial encounter: Secondary | ICD-10-CM | POA: Diagnosis not present

## 2021-07-29 DIAGNOSIS — M47816 Spondylosis without myelopathy or radiculopathy, lumbar region: Secondary | ICD-10-CM | POA: Diagnosis not present

## 2021-07-29 DIAGNOSIS — M9902 Segmental and somatic dysfunction of thoracic region: Secondary | ICD-10-CM | POA: Diagnosis not present
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
## 2021-08-01 ENCOUNTER — Other Ambulatory Visit: Payer: Self-pay

## 2021-08-01 ENCOUNTER — Encounter: Payer: Self-pay | Admitting: Psychiatry

## 2021-08-01 ENCOUNTER — Telehealth (INDEPENDENT_AMBULATORY_CARE_PROVIDER_SITE_OTHER): Payer: Medicare Other | Admitting: Psychiatry

## 2021-08-01 DIAGNOSIS — F411 Generalized anxiety disorder: Secondary | ICD-10-CM | POA: Diagnosis not present

## 2021-08-01 DIAGNOSIS — F451 Undifferentiated somatoform disorder: Secondary | ICD-10-CM | POA: Diagnosis not present

## 2021-08-01 DIAGNOSIS — F3341 Major depressive disorder, recurrent, in partial remission: Secondary | ICD-10-CM

## 2021-08-01 MED ORDER — DULOXETINE HCL 60 MG PO CPEP: 120.0000 mg | ORAL_CAPSULE | Freq: Every day | ORAL | 0 refills | Status: DC

## 2021-08-01 NOTE — Patient Instructions (Signed)
Increase duloxetine 120 mg at night  Next appointment: 1/23 at 1 PM

## 2021-08-04 ENCOUNTER — Ambulatory Visit (INDEPENDENT_AMBULATORY_CARE_PROVIDER_SITE_OTHER): Payer: Medicare Other | Admitting: Podiatry

## 2021-08-04 ENCOUNTER — Ambulatory Visit (INDEPENDENT_AMBULATORY_CARE_PROVIDER_SITE_OTHER): Payer: Medicare Other

## 2021-08-04 ENCOUNTER — Other Ambulatory Visit: Payer: Self-pay

## 2021-08-04 ENCOUNTER — Encounter: Payer: Self-pay | Admitting: Podiatry

## 2021-08-04 DIAGNOSIS — Z9889 Other specified postprocedural states: Secondary | ICD-10-CM

## 2021-08-04 NOTE — Progress Notes (Signed)
Subjective:   Patient ID: Laura Burnett, female   DOB: 55 y.o.   MRN: [id number]   HPI Patient presents stating overall she is doing pretty well but she does have some soreness in her right big toe joint   ROS      Objective:  Physical Exam  Neurovascular status intact negative Bevelyn Buckles' sign noted wound edges coapted well range of motion adequate no crepitus of the joint and significantly better than preoperative     Assessment:  Inflammatory condition which is improving with patient still having some discomfort which is not uncommon given the.  Postop and the fact that she has been bending the toe significantly     Plan:  H&P reviewed condition and the importance of continuing bending forces and that the x-ray looks excellent with no signs of motion and we we will start her on anti-inflammatories continue what she is doing dispensed ankle compression stocking and this should gradually get better but may take several more months to completely clear  X-rays indicate osteotomies healing well fixation in place joint congruence and open

## 2021-08-05 ENCOUNTER — Ambulatory Visit (INDEPENDENT_AMBULATORY_CARE_PROVIDER_SITE_OTHER): Payer: Medicare Other | Admitting: Psychiatry

## 2021-08-05 DIAGNOSIS — F3341 Major depressive disorder, recurrent, in partial remission: Secondary | ICD-10-CM | POA: Diagnosis not present

## 2021-08-05 DIAGNOSIS — F411 Generalized anxiety disorder: Secondary | ICD-10-CM | POA: Diagnosis not present

## 2021-08-05 NOTE — Progress Notes (Signed)
Virtual Visit via Video Note  I connected with Laura Burnett on 08/05/21 at  1:00 PM EST by a video enabled telemedicine application and verified that I am speaking with the correct person using two identifiers.  Location: Patient: Home Provider: [facility] office    I discussed the limitations of evaluation and management by telemedicine and the availability of in person appointments. The patient expressed understanding and agreed to proceed.  I provided 49 minutes of non-face-to-face time during this encounter.   Alonza Smoker, LCSW   THERAPIST PROGRESS NOTE  Session Time: Tuesday 08/05/2021 1:00 PM - 1:49 PM   Participation Level: Active  Behavioral Response: CasualAlert/anxious  Type of Therapy: Individual Therapy  Treatment Goals addressed: Patient will score less than 5 on the generalized anxiety disorder 7 scale  Interventions: CBT  Summary: [name] is a 55 y.o. female who is referred for services by psychiatrist Dr. Modesta Messing due to patient's.  Symptoms of depression and anxiety.  She denies any psychiatric hospitalizations.  She participated in outpatient therapy in this practice with Vonna Kotyk sheets and last was seen in 2019.  Patient states needing help with anxiety as she becomes stressed and irritated very quickly.  She reports difficulty being around people and states avoiding crowds.  Patient last was seen via virtual visit about 2 weeks ago.  She reports continued reduction in anxiety since last session as evidenced by score on the GAD-7.  Patient's score was 3.  However, she reports increased stress with the upcoming holidays.  Patient is worried about making the holiday special for her son and grandchild.  She reports a pattern of pressuring self to perform various task and purchase various items.  She also admits her pattern of should and ought statements about self as well as others.  Patient reports she has been practicing deep breathing but not  consistently.   Suicidal/Homicidal: Nowithout intent/plan  Therapist Response: Reviewed symptoms, administered GAD-7, discussed stressors, facilitated expression of thoughts and feelings, validated feelings, assisted patient began to examine her thought patterns and expectations regarding the holidays, discussed effects on her mood and her behaviorr as well as her interaction with others, assisted patient identify realistic expectations of self, assisted patient identify ways to replace should and ought statements with more helpful statements, discussed the benefits of releasing unrealistic demands, developed plan with patient to use replacement statements between session, developed plan with patient to practice deep breathing daily   Plan: Return again in 2 weeks.  Diagnosis: Axis I: Major depressive disorder, recurrent episode, mild        Alonza Smoker, LCSW 08/05/2021

## 2021-08-25 ENCOUNTER — Other Ambulatory Visit: Payer: Self-pay | Admitting: Family Medicine

## 2021-09-16 ENCOUNTER — Other Ambulatory Visit: Payer: Self-pay

## 2021-09-16 ENCOUNTER — Encounter (HOSPITAL_BASED_OUTPATIENT_CLINIC_OR_DEPARTMENT_OTHER): Payer: Self-pay | Admitting: Nurse Practitioner

## 2021-09-16 ENCOUNTER — Ambulatory Visit (INDEPENDENT_AMBULATORY_CARE_PROVIDER_SITE_OTHER): Payer: 59 | Admitting: Nurse Practitioner

## 2021-09-16 DIAGNOSIS — R1013 Epigastric pain: Secondary | ICD-10-CM

## 2021-09-16 DIAGNOSIS — J342 Deviated nasal septum: Secondary | ICD-10-CM | POA: Diagnosis not present

## 2021-09-16 DIAGNOSIS — G4733 Obstructive sleep apnea (adult) (pediatric): Secondary | ICD-10-CM | POA: Diagnosis not present

## 2021-09-16 DIAGNOSIS — R0981 Nasal congestion: Secondary | ICD-10-CM | POA: Diagnosis not present

## 2021-09-16 MED ORDER — OMEPRAZOLE 40 MG PO CPDR: 40.0000 mg | DELAYED_RELEASE_CAPSULE | Freq: Two times a day (BID) | ORAL | 3 refills | Status: DC

## 2021-09-16 NOTE — Assessment & Plan Note (Addendum)
Chronic continual nasal congestion with reports of chronic sinusitis. Recent ENT and allergist evaluation.  Allergy testing negative.  Patient does have septal deviation which could be contributing to her symptoms. Allergy testing did reveal elevation of histamines despite no known allergen causes.  Discussed with patient the possibility of histamine intolerance and provided with informational handout on ways to reduce histamines naturally in the system with dietary changes. She has been placed on intranasal medication that is somewhat helpful and she is currently using Mucinex which she also feels is helpful but she would like to evaluate for possible septoplasty. Will send referral for plastic surgery for further evaluation.

## 2021-09-16 NOTE — Assessment & Plan Note (Signed)
Following with pulmonology. Historically unable to tolerate CPAP. Unclear if septoplasty could help improve OSA or tolerance of CPAP device however I do feel this would be worth looking into. Recommend discussion with ENT about possible septoplasty given current symptoms.

## 2021-09-16 NOTE — Progress Notes (Signed)
Virtual Visit Encounter telephone visit.   I connected with  Laura Burnett on 09/16/21 at 10:10 AM EST by secure audio and/or video enabled telemedicine application. I verified that I am speaking with the correct person using two identifiers.   I introduced myself as a Designer, jewellery with the practice. The limitations of evaluation and management by telemedicine discussed with the patient and the availability of in person appointments. The patient expressed verbal understanding and consent to proceed.  Participating parties in this visit include: Myself and patient  The patient is: Patient Location: Home I am: Provider Location: Office/Clinic Subjective:    CC and HPI: Laura Burnett is a 56 y.o. year old female presenting for follow up of chronic care needs. She would like to discuss chronic congestion and sinusitis concerns. She endorses chronic sinus infections. She was seen my ENT and subsequently referred to an allergist for evaluation of chronic rhinitis and sinusitis. She tells me her allergy testing was all negative. At this time she has concerns that her chronic congestion in her nasal passages and chronic sinusitis are related to her deviated septum. She is also concerned that this may be a cause of her OSA or possibly her inability to tolerated CPAP.   She reports her bunion surgery was completed towards the end of last year. She states that there is improvement in symptoms, but it still hurts intermittently. She is overall pleased, but tells me that she is concerned about the ongoing pain.   Past medical history, Surgical history, Family history not pertinant except as noted below, Social history, Allergies, and medications have been entered into the medical record, reviewed, and corrections made.   Review of Systems:  All review of systems negative except what is listed in the HPI  Objective:    Alert and oriented x 4 Speaking in clear sentences with no shortness of  breath. No distress.  Impression and Recommendations:    Problem List Items Addressed This Visit     Abdominal pain, acute, epigastric    Refills provided on omeprazole.  Medication is helping with symptom management. No alarm symptoms or concerns today.      Relevant Medications   omeprazole (PRILOSEC) 40 MG capsule   OSA (obstructive sleep apnea)    Following with pulmonology. Historically unable to tolerate CPAP. Unclear if septoplasty could help improve OSA or tolerance of CPAP device however I do feel this would be worth looking into. Recommend discussion with ENT about possible septoplasty given current symptoms.      Nasal congestion - Primary    Chronic continual nasal congestion with reports of chronic sinusitis. Recent ENT and allergist evaluation.  Allergy testing negative.  Patient does have septal deviation which could be contributing to her symptoms. Allergy testing did reveal elevation of histamines despite no known allergen causes.  Discussed with patient the possibility of histamine intolerance and provided with informational handout on ways to reduce histamines naturally in the system with dietary changes. She has been placed on intranasal medication that is somewhat helpful and she is currently using Mucinex which she also feels is helpful but she would like to evaluate for possible septoplasty. Will send referral for plastic surgery for further evaluation.      Nasal septal deviation    Patient has had evaluation with ENT and allergist recently.  Allergy testing was negative. At this point she does have concerns with nasal septum deviation contributing to her chronic rhinitis, chronic sinusitis, and worsening her OSA.  Discussed with the patient that septoplasty may or may not help the symptoms but it would be worth discussing with ENT and plastic surgery to see if this could be beneficial for her treatment. Will attempt to reach Dr. Fredric Dine to determine if  further evaluation is recommended prior to discussion with plastics.      Reach out to Dr. Fredric Dine to ask about septoplasty  orders and follow up as documented in EMR I discussed the assessment and treatment plan with the patient. The patient was provided an opportunity to ask questions and all were answered. The patient agreed with the plan and demonstrated an understanding of the instructions.   The patient was advised to call back or seek an in-person evaluation if the symptoms worsen or if the condition fails to improve as anticipated.  Follow-Up: prn  I provided 26 minutes of non-face-to-face interaction with this non face-to-face encounter including intake, same-day documentation, and chart review.   Orma Render, NP , DNP, AGNP-c [facility] [phone] (fax)

## 2021-09-16 NOTE — Patient Instructions (Addendum)
What is histamine intolerance? Histamine intolerance is not a sensitivity to histamine, but an indication that youve developed too much of it.  Histamine is a chemical responsible for a few major functions:  communicates messages to your brain triggers release of stomach acid to help digestion releases after injury or allergic reaction as part of your immune response When histamine levels get too high or when it cant break down properly, it can affect your normal bodily functions.  Symptoms of histamine intolerance Histamine is associated with common allergic responses and symptoms. Many of these are similar to those from a histamine intolerance.  While they may vary, some common reactions associated with this intolerance include:  headaches or migraine nasal congestion or sinus issues fatigue hives digestive issues irregular menstrual cycle nausea vomiting  In more severe cases of histamine intolerance, you may experience:  abdominal cramping tissue swelling high blood pressure irregular heart rate anxiety difficulty regulating body temperature dizziness  What causes high histamine levels? You naturally produce histamine along with the enzyme diamine oxidase (DAO). DAO is responsible for breaking down histamine that you take in from foods.  If you develop a DAO deficiency and are unable to break down histamine, you could develop an intolerance.  Some reasons your DAO enzyme levels could be affected include:  medications that block DAO functions or prevent production gastrointestinal disorders, such as leaky gut syndrome and inflammatory bowel disease histamine-rich foods that cause DAO enzymes to function improperly foods that block DAO enzymes or trigger histamine release Bacterial overgrowth is another contributing factor for developing a histamine intolerance. Bacteria grows when food isnt digested properly, causing histamine overproduction. Normal levels of DAO  enzymes cant break down the increased levels of histamine in your body, causing a reaction.  Controlling histamine levels with diet  Foods to avoid A healthy diet contains moderate levels of histamine. However, there are some foods high in histamine that can trigger inflammatory reactions and other negative symptoms.  Histamine-rich foods are:  alcohol and other fermented beverages fermented foods and dairy products, such as yogurt and sauerkraut dried fruits avocados eggplant spinach processed or smoked meats shellfish aged cheese  There are also a number of foods that trigger histamine release in the body, such as:  alcohol bananas tomatoes wheat germ beans papaya chocolate citrus fruits nuts, specifically walnuts, cashews and peanuts food dyes and other additives  Foods that block DAO production include:  alcohol black tea mate tea green tea energy drinks  Foods to eat  If you have a histamine intolerance, incorporating low-histamine foods into your diet can help reduce symptoms. Theres no such thing as a histamine-free diet. Consult with a dietician before you eliminate foods from your diet.  Some foods low in histamine include:  fresh meat and freshly caught fish non-citrus fruits eggs gluten-free grains, such as quinoa and rice dairy substitutes, such as coconut milk and almond milk fresh vegetables except tomatoes, avocados, spinach, and eggplant cooking oils, such as olive oil   Diagnosing histamine intolerance Before reaching a diagnosis, your doctor will eliminate other possible disorders or allergies that cause similar symptoms.  Doctors may also suggest following an elimination diet for 14 to 30 days. This diet requires you to remove any foods high in histamine or histamine triggers, and slowly reintroduce them to watch for new reactions.  Your doctor might also take a blood sample to analyze if you have a DAO deficiency.  Another way to  diagnose histamine intolerance is through a prick  test. A 2011 study[employer] examined the effectiveness of a prick test to diagnose histamine intolerance. Researchers pricked the skin of 156 people and applied a 1 percent histamine solution.  For those with suspected histamine intolerance, the prick test was positive for 79 percent, revealing a small red, itchy bump on the tested area that didnt resolve within 50 minutes.

## 2021-09-16 NOTE — Assessment & Plan Note (Signed)
Refills provided on omeprazole.  Medication is helping with symptom management. No alarm symptoms or concerns today.

## 2021-09-16 NOTE — Assessment & Plan Note (Signed)
Patient has had evaluation with ENT and allergist recently.  Allergy testing was negative. At this point she does have concerns with nasal septum deviation contributing to her chronic rhinitis, chronic sinusitis, and worsening her OSA. Discussed with the patient that septoplasty may or may not help the symptoms but it would be worth discussing with ENT and plastic surgery to see if this could be beneficial for her treatment. Will attempt to reach Dr. Fredric Dine to determine if further evaluation is recommended prior to discussion with plastics.
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
## 2021-09-19 ENCOUNTER — Encounter: Payer: Self-pay | Admitting: Nurse Practitioner

## 2021-09-22 ENCOUNTER — Other Ambulatory Visit: Payer: Self-pay

## 2021-09-22 ENCOUNTER — Telehealth (INDEPENDENT_AMBULATORY_CARE_PROVIDER_SITE_OTHER): Payer: 59 | Admitting: Psychiatry

## 2021-09-22 ENCOUNTER — Encounter: Payer: Self-pay | Admitting: Psychiatry

## 2021-09-22 DIAGNOSIS — F451 Undifferentiated somatoform disorder: Secondary | ICD-10-CM | POA: Diagnosis not present

## 2021-09-22 DIAGNOSIS — F3341 Major depressive disorder, recurrent, in partial remission: Secondary | ICD-10-CM

## 2021-09-22 DIAGNOSIS — F411 Generalized anxiety disorder: Secondary | ICD-10-CM | POA: Diagnosis not present

## 2021-09-22 NOTE — Patient Instructions (Signed)
Continue duloxetine 120 mg at night  Next appointment: 3/20 at 1 PM

## 2021-09-25 ENCOUNTER — Other Ambulatory Visit: Payer: Self-pay | Admitting: Nurse Practitioner

## 2021-09-25 DIAGNOSIS — M542 Cervicalgia: Secondary | ICD-10-CM

## 2021-09-25 DIAGNOSIS — G5601 Carpal tunnel syndrome, right upper limb: Secondary | ICD-10-CM

## 2021-09-25 DIAGNOSIS — M65321 Trigger finger, right index finger: Secondary | ICD-10-CM

## 2021-09-25 DIAGNOSIS — M5416 Radiculopathy, lumbar region: Secondary | ICD-10-CM

## 2021-09-29 ENCOUNTER — Other Ambulatory Visit (HOSPITAL_BASED_OUTPATIENT_CLINIC_OR_DEPARTMENT_OTHER): Payer: Self-pay | Admitting: Nurse Practitioner

## 2021-09-29 DIAGNOSIS — M65321 Trigger finger, right index finger: Secondary | ICD-10-CM

## 2021-09-29 DIAGNOSIS — M5416 Radiculopathy, lumbar region: Secondary | ICD-10-CM

## 2021-09-29 DIAGNOSIS — G5601 Carpal tunnel syndrome, right upper limb: Secondary | ICD-10-CM

## 2021-09-29 DIAGNOSIS — M542 Cervicalgia: Secondary | ICD-10-CM

## 2021-10-01 ENCOUNTER — Other Ambulatory Visit: Payer: Self-pay

## 2021-10-01 ENCOUNTER — Telehealth (INDEPENDENT_AMBULATORY_CARE_PROVIDER_SITE_OTHER): Payer: 59 | Admitting: Nurse Practitioner

## 2021-10-01 ENCOUNTER — Encounter (HOSPITAL_BASED_OUTPATIENT_CLINIC_OR_DEPARTMENT_OTHER): Payer: Self-pay | Admitting: Nurse Practitioner

## 2021-10-01 DIAGNOSIS — J0140 Acute pansinusitis, unspecified: Secondary | ICD-10-CM | POA: Diagnosis not present

## 2021-10-01 MED ORDER — AMOXICILLIN-POT CLAVULANATE 875-125 MG PO TABS: 1.0000 | ORAL_TABLET | Freq: Two times a day (BID) | ORAL | 0 refills | Status: DC

## 2021-10-01 MED ORDER — FLUCONAZOLE 150 MG PO TABS
150.0000 mg | ORAL_TABLET | Freq: Once | ORAL | 0 refills | Status: AC
Start: 2021-10-01 — End: 2021-10-01

## 2021-10-01 NOTE — Patient Instructions (Signed)
I recommend using flonase two sprays in each nostril in the morning and at bedtime for the congestion and ear pain. I would do this high dose for as long as you are sick.

## 2021-10-01 NOTE — Progress Notes (Signed)
Virtual Visit Encounter telephone visit.   I connected with  Laura Burnett on 10/03/21 at 10:50 AM EST by secure audio and/or video enabled telemedicine application. I verified that I am speaking with the correct person using two identifiers.   I introduced myself as a Designer, jewellery with the practice. The limitations of evaluation and management by telemedicine discussed with the patient and the availability of in person appointments. The patient expressed verbal understanding and consent to proceed.  Participating parties in this visit include: Myself and patient  The patient is: Patient Location: Home I am: Provider Location: Office/Clinic Subjective:    CC and HPI: Laura Burnett is a 56 y.o. year old female presenting for new evaluation and treatment of sinus pain and pressure, congestion, sore throat, headache, rhinorrhea that have been present for about the past 7 days. She denies fever, chills, cough.  She has had a negative COVID test.  She has not been taking any medication for this but does have a history of recurrent sinusitis which requires antibiotic treatment. She does tell me that she felt like her symptoms were improving a couple of days ago but have now worsened again.  Past medical history, Surgical history, Family history not pertinant except as noted below, Social history, Allergies, and medications have been entered into the medical record, reviewed, and corrections made.   Review of Systems:  All review of systems negative except what is listed in the HPI  Objective:    Alert and oriented x 4 Speaking in clear sentences with no shortness of breath. No distress.  Impression and Recommendations:    Problem List Items Addressed This Visit   None Visit Diagnoses     Acute non-recurrent pansinusitis    -  Primary   Relevant Medications   amoxicillin-clavulanate (AUGMENTIN) 875-125 MG tablet     Symptoms and presentation consistent with acute on recurrent  sinusitis. Given the length of time symptoms have been present and the nature of the symptoms we will begin antibiotic therapy today. Discussed with patient the importance of monitoring for new or worsening symptoms and notify the office if symptoms do not improve with antibiotic completion. Recommend over-the-counter medications to help with symptoms including alternating Tylenol and ibuprofen every 4-6 hours, Mucinex, Flonase, mentholated rubs/cough drops. She will follow-up if her symptoms are not any better by next week.  orders and follow up as documented in EMR I discussed the assessment and treatment plan with the patient. The patient was provided an opportunity to ask questions and all were answered. The patient agreed with the plan and demonstrated an understanding of the instructions.   The patient was advised to call back or seek an in-person evaluation if the symptoms worsen or if the condition fails to improve as anticipated.  Follow-Up: prn  I provided 20 minutes of non-face-to-face interaction with this non face-to-face encounter including intake, same-day documentation, and chart review.   Orma Render, NP , DNP, AGNP-c [facility] [facility] [phone] (fax)

## 2021-10-27 ENCOUNTER — Ambulatory Visit (INDEPENDENT_AMBULATORY_CARE_PROVIDER_SITE_OTHER): Payer: 59 | Admitting: Nurse Practitioner

## 2021-10-27 ENCOUNTER — Ambulatory Visit (HOSPITAL_BASED_OUTPATIENT_CLINIC_OR_DEPARTMENT_OTHER)
Admission: RE | Admit: 2021-10-27 | Discharge: 2021-10-27 | Disposition: A | Discharge status: Home / Self Care | Payer: 59 | Source: Ambulatory Visit | Attending: Endocrinology | Admitting: Endocrinology

## 2021-10-27 ENCOUNTER — Encounter (HOSPITAL_BASED_OUTPATIENT_CLINIC_OR_DEPARTMENT_OTHER): Payer: Self-pay | Admitting: Nurse Practitioner

## 2021-10-27 ENCOUNTER — Other Ambulatory Visit: Payer: Self-pay

## 2021-10-27 VITALS — BP 128/72 | HR 104 | Ht 62.0 in | Wt 200.2 lb

## 2021-10-27 DIAGNOSIS — J0140 Acute pansinusitis, unspecified: Secondary | ICD-10-CM | POA: Insufficient documentation

## 2021-10-27 DIAGNOSIS — J302 Other seasonal allergic rhinitis: Secondary | ICD-10-CM

## 2021-10-27 DIAGNOSIS — D229 Melanocytic nevi, unspecified: Secondary | ICD-10-CM | POA: Diagnosis not present

## 2021-10-27 DIAGNOSIS — J324 Chronic pansinusitis: Secondary | ICD-10-CM

## 2021-10-27 DIAGNOSIS — M542 Cervicalgia: Secondary | ICD-10-CM | POA: Diagnosis not present

## 2021-10-27 DIAGNOSIS — R0981 Nasal congestion: Secondary | ICD-10-CM | POA: Diagnosis not present

## 2021-10-27 DIAGNOSIS — J342 Deviated nasal septum: Secondary | ICD-10-CM

## 2021-10-27 DIAGNOSIS — L219 Seborrheic dermatitis, unspecified: Secondary | ICD-10-CM | POA: Diagnosis not present

## 2021-10-27 DIAGNOSIS — J0141 Acute recurrent pansinusitis: Secondary | ICD-10-CM | POA: Insufficient documentation

## 2021-10-27 DIAGNOSIS — E010 Iodine-deficiency related diffuse (endemic) goiter: Secondary | ICD-10-CM

## 2021-10-27 MED ORDER — AZELASTINE HCL 0.1 % NA SOLN: 2.0000 | Freq: Two times a day (BID) | NASAL | 12 refills | Status: DC

## 2021-10-27 MED ORDER — IOHEXOL 300 MG/ML  SOLN
100.0000 mL | Freq: Once | INTRAMUSCULAR | Status: AC | PRN
  Administered 2021-10-27: 75 mL via INTRAVENOUS

## 2021-10-27 MED ORDER — MONTELUKAST SODIUM 10 MG PO TABS: 10.0000 mg | ORAL_TABLET | Freq: Every day | ORAL | 3 refills | Status: DC

## 2021-10-27 MED ORDER — KETOCONAZOLE 2 % EX SHAM: 1.0000 "application " | MEDICATED_SHAMPOO | CUTANEOUS | 0 refills | Status: DC

## 2021-10-27 MED ORDER — DOXYCYCLINE HYCLATE 100 MG PO TABS: 100.0000 mg | ORAL_TABLET | Freq: Two times a day (BID) | ORAL | 0 refills | Status: DC

## 2021-10-27 NOTE — Assessment & Plan Note (Signed)
CT today to monitor swelling in neck.  No alarm symptoms present at this time.  Will follow

## 2021-10-27 NOTE — Assessment & Plan Note (Signed)
Referral to dermatology.  

## 2021-10-27 NOTE — Assessment & Plan Note (Signed)
Recommend nizoral shampoo once weekly to the scalp for management.  F/U if sx worsen or fail to improve.

## 2021-10-27 NOTE — Assessment & Plan Note (Signed)
Discussion with patient that there is no guarantee that rhinoplasty and correction of deviated septum would improve her symptoms, but this could be possible if the deviation is severe enough to be causing the chronic issues.  Will research to see if this is recommended for patient.

## 2021-10-27 NOTE — Assessment & Plan Note (Signed)
Recommend dry needling and PT for tightness in the trapezius and cervical spine.  Symptoms could be related to cervical neuropathic pain, although I would expect high doses of gabapentin to be helping with this.  No alarm symptoms present. She does follow with chiropractic care.  Will send referral to chiropractors office for dry needling and therapy services .

## 2021-10-27 NOTE — Progress Notes (Signed)
Acute Office Visit  Subjective:    Patient ID: Laura Burnett, female    DOB: 24-Jan-1966, 56 y.o.   MRN: [id number]  Chief Complaint  Patient presents with   Acute Visit    Patient presents today with glands swollen, nose drainage. No OTC medications are helping she would like referral to derm for moles on back.    HPI Patient is in today for sinus congestion, drainage, swollen glands. She has a history of chronic sinusitis with frequent infections. She is concerned that this is presenting again. She has not had any fevers, but she tells me that she feels the same pressure in her sinuses at the onset of infection. She was recently treated with augmentin and tells me her symptoms improved, but then returned. She also endorses PND and eye pain.   She is having a CT of the neck today to evaluate the swelling in the anterior neck. She tells me she has waiting 5 months for this and is glad she is having it done. She reports the swelling is getting worse.   She does have questions as to whether her deviated septum could be causing the frequent sinus infections and if this would improve if she had surgery.   She also has concerns for chronic spasms in the trapezius region and neck. She reports intermittent shooting pain into the back of her head and up around the top of the head along with pain around the jawline on the right side. She believes this could be occipital neuralgia. She is on daily flexeril and gabapentin max doses.   She also has concerns with small "pimples" that appear in her hair. She tells me that these show up intermittently and she scratches them. She is not sure what is causing them.   Past Medical History:  Diagnosis Date   Allergy    seasonal allergies/animals   Anxiety    Arthritis    Asthma    as a child " exercise induced"   Bronchitis    Constipation    Depression    DJD (degenerative joint disease)    Fibromyalgia    GERD (gastroesophageal reflux disease)     Headache    Hypothyroidism    Obesity    Perioral dermatitis 03/12/2021   PONV (postoperative nausea and vomiting)    Wears glasses     Past Surgical History:  Procedure Laterality Date   ABDOMINAL HYSTERECTOMY     ADENOIDECTOMY     APPLICATION OF ROBOTIC ASSISTANCE FOR SPINAL PROCEDURE N/A 12/21/2016   Procedure: APPLICATION OF ROBOTIC ASSISTANCE FOR SPINAL PROCEDURE;  Surgeon: Kevan Ny Ditty, MD;  Location: [facility];  Service: Neurosurgery;  Laterality: N/A;   BACK SURGERY     L5-S1   CARPAL TUNNEL RELEASE Right 01/16/2019   Procedure: RIGHT CARPAL TUNNEL RELEASE;  Surgeon: [name], MD;  Location: [facility];  Service: Orthopedics;  Laterality: Right;   COLONOSCOPY     CYST EXCISION     uterine ;subsequent sx followed   FOOT SURGERY     HAND SURGERY     from [facility]   KNEE ARTHROSCOPY     x2   TONSILLECTOMY     TRIGGER FINGER RELEASE Right 01/16/2019   Procedure: RIGHT TRIGGER THUMB RELEASE;  Surgeon: [name], MD;  Location: [facility];  Service: Orthopedics;  Laterality: Right;   TRIGGER FINGER RELEASE Right 12/04/2019   Procedure: right index trigger finger release;  Surgeon: Lorin Mercy,  Thana Farr, MD;  Location: [facility];  Service: Orthopedics;  Laterality: Right;   UPPER GASTROINTESTINAL ENDOSCOPY      Family History  Problem Relation Age of Onset   Alzheimer's disease Mother    Diabetes Mother    Heart attack Father    Colon cancer Father    Heart failure Father    Thyroid disease Sister    Thyroid disease Brother    Stomach cancer Brother    Cancer Brother    Cancer Brother    ADD / ADHD Other    Esophageal cancer Neg Hx    Rectal cancer Neg Hx     Social History   Socioeconomic History   Marital status: Divorced    Spouse name: Not on file   Number of children: 1   Years of education: 12   Highest education level: High school graduate  Occupational History   Occupation: Disabled   Tobacco Use   Smoking  status: Former   Smokeless tobacco: Never   Tobacco comments:    stopped smoking cigarettes at age 89  Vaping Use   Vaping Use: Never used  Substance and Sexual Activity   Alcohol use: Not Currently   Drug use: No   Sexual activity: Not Currently  Other Topics Concern   Not on file  Social History Narrative   Patient lives alone in [place], [place].   Patient has one son who lives nearby.    Patient has 2 grandchildren.    Patient enjoys sewing, being outside in her garden, and spending time with family.    Social Determinants of Health   Financial Resource Strain: Low Risk    Difficulty of Paying Living Expenses: Not hard at all  Food Insecurity: No Food Insecurity   Worried About Charity fundraiser in the Last Year: Never true   [place] in the Last Year: Never true  Transportation Needs: No Transportation Needs   Lack of Transportation (Medical): No   Lack of Transportation (Non-Medical): No  Physical Activity: Inactive   Days of Exercise per Week: 0 days   Minutes of Exercise per Session: 0 min  Stress: Stress Concern Present   Feeling of Stress : Rather much  Social Connections: Socially Isolated   Frequency of Communication with Friends and Family: More than three times a week   Frequency of Social Gatherings with Friends and Family: More than three times a week   Attends Religious Services: Never   Marine scientist or Organizations: No   Attends Music therapist: Never   Marital Status: Divorced  Human resources officer Violence: Not At Risk   Fear of Current or Ex-Partner: No   Emotionally Abused: No   Physically Abused: No   Sexually Abused: No    Outpatient Medications Prior to Visit  Medication Sig Dispense Refill   levothyroxine (SYNTHROID) 100 MCG tablet Take 1 tablet by mouth once daily 90 tablet 0   albuterol (VENTOLIN HFA) 108 (90 Base) MCG/ACT inhaler INHALE 2 PUFFS BY MOUTH EVERY 4 HOURS AS NEEDED FOR WHEEZING AND FOR SHORTNESS OF BREATH  18 g 0   atorvastatin (LIPITOR) 40 MG tablet Take 1 tablet (40 mg total) by mouth daily. 90 tablet 3   cyclobenzaprine (FLEXERIL) 10 MG tablet TAKE 1 TABLET BY MOUTH THREE TIMES DAILY AS NEEDED FOR MUSCLE SPASM **WORK  TO  TAPER  TO  TWICE  A  DAY  MAXIMUM** 270 tablet 0   docusate  sodium (COLACE) 100 MG capsule Take 100 mg by mouth 2 (two) times daily.     DULoxetine (CYMBALTA) 60 MG capsule Take 2 capsules (120 mg total) by mouth at bedtime. 180 capsule 0   fluticasone (FLONASE) 50 MCG/ACT nasal spray Place 2 sprays into both nostrils daily. 16 g 6   gabapentin (NEURONTIN) 800 MG tablet TAKE 1 TABLET BY MOUTH THREE TIMES DAILY 180 tablet 0   levocetirizine (XYZAL) 5 MG tablet Take 1 tablet (5 mg total) by mouth every evening. 30 tablet 2   omeprazole (PRILOSEC) 40 MG capsule Take 1 capsule (40 mg total) by mouth 2 (two) times daily. 180 capsule 3   amoxicillin-clavulanate (AUGMENTIN) 875-125 MG tablet Take 1 tablet by mouth 2 (two) times daily. 14 tablet 0   diazepam (VALIUM) 2 MG tablet Take 1 tablet by mouth daily as needed.     No facility-administered medications prior to visit.    Allergies  Allergen Reactions   Ciprofloxacin Nausea And Vomiting   Phentermine Other (See Comments)    Tearing, lightheadedness, depression   Tape Other (See Comments)    " I get red and itchy." Paper tape okay   Percocet [Oxycodone-Acetaminophen] Hives and Rash   Tetanus Toxoids Nausea And Vomiting   Tylenol With Codeine #3 [Acetaminophen-Codeine] Rash    Review of Systems All review of systems negative except what is listed in the HPI     Objective:    Physical Exam Vitals and nursing note reviewed.  Constitutional:      Appearance: She is ill-appearing.  HENT:     Head: Normocephalic.     Right Ear: Tympanic membrane normal.     Left Ear: Tympanic membrane normal.     Nose: Nasal deformity, congestion and rhinorrhea present.     Right Turbinates: Enlarged and swollen.     Left  Turbinates: Enlarged and swollen.     Right Sinus: Maxillary sinus tenderness and frontal sinus tenderness present.     Left Sinus: Maxillary sinus tenderness and frontal sinus tenderness present.     Mouth/Throat:     Mouth: Mucous membranes are moist.     Pharynx: Posterior oropharyngeal erythema present. No oropharyngeal exudate.  Eyes:     Extraocular Movements: Extraocular movements intact.     Conjunctiva/sclera: Conjunctivae normal.     Pupils: Pupils are equal, round, and reactive to light.  Neck:     Vascular: No carotid bruit.  Cardiovascular:     Rate and Rhythm: Normal rate and regular rhythm.     Pulses: Normal pulses.     Heart sounds: Normal heart sounds.  Pulmonary:     Effort: Pulmonary effort is normal.     Breath sounds: Wheezing present.     Comments: Left lower lobe Musculoskeletal:     Cervical back: Tenderness present.     Right lower leg: No edema.     Left lower leg: No edema.  Lymphadenopathy:     Cervical: Cervical adenopathy present.  Skin:    General: Skin is warm and dry.     Capillary Refill: Capillary refill takes less than 2 seconds.  Neurological:     General: No focal deficit present.     Mental Status: She is alert and oriented to person, place, and time.     Cranial Nerves: No cranial nerve deficit.     Sensory: No sensory deficit.     Motor: No weakness.     Coordination: Coordination normal.  Psychiatric:  Mood and Affect: Mood normal.        Behavior: Behavior normal.        Thought Content: Thought content normal.        Judgment: Judgment normal.    BP 128/72    Pulse (!) 104    Ht 5\' 2"  (1.575 m)    Wt 200 lb 3.2 oz (90.8 kg)    SpO2 96%    BMI 36.62 kg/m  Wt Readings from Last 3 Encounters:  10/27/21 200 lb 3.2 oz (90.8 kg)  06/16/21 197 lb 12.8 oz (89.7 kg)  05/22/21 200 lb (90.7 kg)    Health Maintenance Due  Topic Date Due   TETANUS/TDAP  Never done   Zoster Vaccines- Shingrix (1 of 2) Never done   COVID-19  Vaccine (4 - Booster for Moderna series) 09/02/2020   MAMMOGRAM  05/02/2021    There are no preventive care reminders to display for this patient.   Lab Results  Component Value Date   TSH 2.17 04/17/2021   Lab Results  Component Value Date   WBC 7.5 04/30/2020   HGB 13.0 04/30/2020   HCT 38.7 04/30/2020   MCV 89 04/30/2020   PLT 448 04/30/2020   Lab Results  Component Value Date   NA 140 04/17/2021   K 4.2 04/17/2021   CO2 27 04/17/2021   GLUCOSE 81 04/17/2021   BUN 11 04/17/2021   CREATININE 0.87 04/17/2021   BILITOT 0.2 04/30/2020   ALKPHOS 185 (H) 04/30/2020   AST 17 04/30/2020   ALT 13 04/30/2020   PROT 7.2 04/30/2020   ALBUMIN 4.3 04/30/2020   CALCIUM 9.4 04/17/2021   ANIONGAP 10 01/12/2019   GFR 73.93 04/17/2021   Lab Results  Component Value Date   CHOL 215 (H) 07/16/2020   Lab Results  Component Value Date   HDL 54 07/16/2020   Lab Results  Component Value Date   LDLCALC 119 (H) 07/16/2020   Lab Results  Component Value Date   TRIG 239 (H) 07/16/2020   Lab Results  Component Value Date   CHOLHDL 4.0 07/16/2020   No results found for: HGBA1C     Assessment & Plan:   Problem List Items Addressed This Visit     Neck pain    Recommend dry needling and PT for tightness in the trapezius and cervical spine.  Symptoms could be related to cervical neuropathic pain, although I would expect high doses of gabapentin to be helping with this.  No alarm symptoms present. She does follow with chiropractic care.  Will send referral to chiropractors office for dry needling and therapy services .       Thyromegaly    CT today to monitor swelling in neck.  No alarm symptoms present at this time.  Will follow      Nasal septal deviation    Discussion with patient that there is no guarantee that rhinoplasty and correction of deviated septum would improve her symptoms, but this could be possible if the deviation is severe enough to be causing the  chronic issues.  Will research to see if this is recommended for patient.       Seborrheic dermatitis of scalp - Primary    Recommend nizoral shampoo once weekly to the scalp for management.  F/U if sx worsen or fail to improve.       Relevant Medications   ketoconazole (NIZORAL) 2 % shampoo   Numerous moles    Referral to dermatology.  Relevant Orders   Ambulatory referral to Dermatology   Acute recurrent pansinusitis    Will send treatment today with doxycycline to see if we can get this cleared for her. Hopeful the antiinflammatory effect of the medication will provide additional benefit.  Also start montelukast and azelastine for symptom management.  F/U if sx worsen or fail to improve.       Relevant Medications   azelastine (ASTELIN) 0.1 % nasal spray   doxycycline (VIBRA-TABS) 100 MG tablet   Nasal congestion   Seasonal allergies   Other Visit Diagnoses     Chronic pansinusitis       Relevant Medications   azelastine (ASTELIN) 0.1 % nasal spray   montelukast (SINGULAIR) 10 MG tablet   doxycycline (VIBRA-TABS) 100 MG tablet        Meds ordered this encounter  Medications   azelastine (ASTELIN) 0.1 % nasal spray    Sig: Place 2 sprays into both nostrils 2 (two) times daily. Use in each nostril as directed    Dispense:  30 mL    Refill:  12   montelukast (SINGULAIR) 10 MG tablet    Sig: Take 1 tablet (10 mg total) by mouth at bedtime.    Dispense:  30 tablet    Refill:  3   ketoconazole (NIZORAL) 2 % shampoo    Sig: Apply 1 application topically 2 (two) times a week.    Dispense:  120 mL    Refill:  0   doxycycline (VIBRA-TABS) 100 MG tablet    Sig: Take 1 tablet (100 mg total) by mouth 2 (two) times daily.    Dispense:  14 tablet    Refill:  0    Time: 38 minutes, >50% spent counseling, care coordination, chart review, and documentation.   Orma Render, NP

## 2021-10-27 NOTE — Assessment & Plan Note (Signed)
Will send treatment today with doxycycline to see if we can get this cleared for her. Hopeful the antiinflammatory effect of the medication will provide additional benefit.  Also start montelukast and azelastine for symptom management.  F/U if sx worsen or fail to improve.

## 2021-10-27 NOTE — Patient Instructions (Addendum)
[name]!!  I will look up the surgery options to see if this would be helpful for your current symptoms and let you know.  I have sent in montelukast and azelastin spray to help with your symptoms. You can take this continuously. I also sent in doxycycline to see if this helps with the pressure.   I sent in Nizoral (ketoconazole) shampoo for you to use once a week to help with the places in your scalp.   Let me now if you are not feeling better.   I sent a referral to dermatology for you.   I will send the referral for DryNeedling/PT to see if this helps with the neck.

## 2021-10-28 ENCOUNTER — Encounter (HOSPITAL_BASED_OUTPATIENT_CLINIC_OR_DEPARTMENT_OTHER): Payer: Self-pay | Admitting: Nurse Practitioner

## 2021-11-04 ENCOUNTER — Ambulatory Visit (HOSPITAL_BASED_OUTPATIENT_CLINIC_OR_DEPARTMENT_OTHER): Payer: 59 | Admitting: Nurse Practitioner

## 2021-11-04 ENCOUNTER — Other Ambulatory Visit (HOSPITAL_BASED_OUTPATIENT_CLINIC_OR_DEPARTMENT_OTHER): Payer: Self-pay | Admitting: Nurse Practitioner

## 2021-11-04 DIAGNOSIS — I7090 Unspecified atherosclerosis: Secondary | ICD-10-CM

## 2021-11-07 ENCOUNTER — Other Ambulatory Visit (HOSPITAL_BASED_OUTPATIENT_CLINIC_OR_DEPARTMENT_OTHER): Payer: Self-pay | Admitting: Nurse Practitioner

## 2021-11-07 DIAGNOSIS — B079 Viral wart, unspecified: Secondary | ICD-10-CM

## 2021-11-07 DIAGNOSIS — L219 Seborrheic dermatitis, unspecified: Secondary | ICD-10-CM

## 2021-11-07 DIAGNOSIS — J342 Deviated nasal septum: Secondary | ICD-10-CM

## 2021-11-07 DIAGNOSIS — G4733 Obstructive sleep apnea (adult) (pediatric): Secondary | ICD-10-CM

## 2021-11-07 DIAGNOSIS — J0141 Acute recurrent pansinusitis: Secondary | ICD-10-CM

## 2021-11-07 DIAGNOSIS — R0981 Nasal congestion: Secondary | ICD-10-CM

## 2021-11-07 DIAGNOSIS — L280 Lichen simplex chronicus: Secondary | ICD-10-CM

## 2021-11-13 NOTE — Progress Notes (Signed)
Virtual Visit via Video Note ? ?I connected with Laura Burnett on 11/17/21 at  1:00 PM EDT by a video enabled telemedicine application and verified that I am speaking with the correct person using two identifiers. ? ?Location: ?Patient: home ?Provider: office ?Persons participated in the visit- patient, provider  ?  ?I discussed the limitations of evaluation and management by telemedicine and the availability of in person appointments. The patient expressed understanding and agreed to proceed. ?  ?I discussed the assessment and treatment plan with the patient. The patient was provided an opportunity to ask questions and all were answered. The patient agreed with the plan and demonstrated an understanding of the instructions. ?  ?The patient was advised to call back or seek an in-person evaluation if the symptoms worsen or if the condition fails to improve as anticipated. ? ?I provided 18 minutes of non-face-to-face time during this encounter. ? ? ?Norman Clay, MD ? ? ? ?BH MD/PA/NP OP Progress Note ? ?11/17/2021 1:23 PM ?Laura Burnett  ?MRN:  [id number] ? ?Chief Complaint:  ?Chief Complaint  ?Patient presents with  ? Depression  ? Follow-up  ? ?HPI:  ?This is a follow-up appointment for depression.  ?She states that she feels more "calm" lately. On further elaboration, she states that she does not have good energy to do things as much compared to before.  She is not activated.  Although she still sees her son and her granddaughter, she has mild anhedonia.  She also feels sad at times.  Although she likes being outside, and this has been limited due to her pain.  She sleeps well.  She feels anxious and tense at times.  She denies panic attacks except she had diaphoresis the other time.  She denies change in appetite.  She denies SI.  She denies alcohol use or drug use.  Although she wonders if she could lower the dose of duloxetine, she verbalized understanding to add medication given she was doing well at the last  visit while she was on this higher dose of the duloxetine.   ? ? ?Daily routine: painting furniture, puzzles, sewing at times,  visits her granddaughter, who is 31 yo old 2022 ?Employment: unemployed, on disability for pain ?Household:  self ?Marital status: divorced ?Her father deceased a few years ago from CHF, mother in 2011 from strokes ?Children: 1 son, age 52 ?  ? ? ?Visit Diagnosis:  ?  ICD-10-CM   ?1. MDD (major depressive disorder), recurrent episode, mild ([place])  F33.0   ?  ?2. GAD (generalized anxiety disorder)  F41.1   ?  ?3. Somatic symptom disorder  F45.1   ?  ? ? ?Past Psychiatric History: Please see initial evaluation for full details. I have reviewed the history. No updates at this time.  ?  ? ?Past Medical History:  ?Past Medical History:  ?Diagnosis Date  ? Allergy   ? seasonal allergies/animals  ? Anxiety   ? Arthritis   ? Asthma   ? as a child " exercise induced"  ? Bronchitis   ? Constipation   ? Depression   ? DJD (degenerative joint disease)   ? Fibromyalgia   ? GERD (gastroesophageal reflux disease)   ? Headache   ? Hypothyroidism   ? Obesity   ? Perioral dermatitis 03/12/2021  ? PONV (postoperative nausea and vomiting)   ? Wears glasses   ?  ?Past Surgical History:  ?Procedure Laterality Date  ? ABDOMINAL HYSTERECTOMY    ? ADENOIDECTOMY    ?  APPLICATION OF ROBOTIC ASSISTANCE FOR SPINAL PROCEDURE N/A 12/21/2016  ? Procedure: APPLICATION OF ROBOTIC ASSISTANCE FOR SPINAL PROCEDURE;  Surgeon: Kevan Ny Ditty, MD;  Location: [facility];  Service: Neurosurgery;  Laterality: N/A;  ? BACK SURGERY    ? L5-S1  ? CARPAL TUNNEL RELEASE Right 01/16/2019  ? Procedure: RIGHT CARPAL TUNNEL RELEASE;  Surgeon: [name], MD;  Location: [facility];  Service: Orthopedics;  Laterality: Right;  ? COLONOSCOPY    ? CYST EXCISION    ? uterine ;subsequent sx followed  ? FOOT SURGERY    ? HAND SURGERY    ? from [place]  ? KNEE ARTHROSCOPY    ? x2  ? TONSILLECTOMY    ? TRIGGER FINGER RELEASE Right 01/16/2019   ? Procedure: RIGHT TRIGGER THUMB RELEASE;  Surgeon: [name], MD;  Location: [facility];  Service: Orthopedics;  Laterality: Right;  ? TRIGGER FINGER RELEASE Right 12/04/2019  ? Procedure: right index trigger finger release;  Surgeon: [name], MD;  Location: [facility];  Service: Orthopedics;  Laterality: Right;  ? UPPER GASTROINTESTINAL ENDOSCOPY    ? ? ?Family Psychiatric History: Please see initial evaluation for full details. I have reviewed the history. No updates at this time.  ?  ? ?Family History:  ?Family History  ?Problem Relation Age of Onset  ? Alzheimer's disease Mother   ? Diabetes Mother   ? Heart attack Father   ? Colon cancer Father   ? Heart failure Father   ? Thyroid disease Sister   ? Thyroid disease Brother   ? Stomach cancer Brother   ? Cancer Brother   ? Cancer Brother   ? ADD / ADHD Other   ? Esophageal cancer Neg Hx   ? Rectal cancer Neg Hx   ? ? ?Social History:  ?Social History  ? ?Socioeconomic History  ? Marital status: Divorced  ?  Spouse name: Not on file  ? Number of children: 1  ? Years of education: 49  ? Highest education level: High school graduate  ?Occupational History  ? Occupation: Disabled   ?Tobacco Use  ? Smoking status: Former  ? Smokeless tobacco: Never  ? Tobacco comments:  ?  stopped smoking cigarettes at age 43  ?Vaping Use  ? Vaping Use: Never used  ?Substance and Sexual Activity  ? Alcohol use: Not Currently  ? Drug use: No  ? Sexual activity: Not Currently  ?Other Topics Concern  ? Not on file  ?Social History Narrative  ? Patient lives alone in [place], [place].  ? Patient has one son who lives nearby.   ? Patient has 2 grandchildren.   ? Patient enjoys sewing, being outside in her garden, and spending time with family.   ? ?Social Determinants of Health  ? ?Financial Resource Strain: Low Risk   ? Difficulty of Paying Living Expenses: Not hard at all  ?Food Insecurity: No Food Insecurity  ? Worried About Charity fundraiser in the  Last Year: Never true  ? Ran Out of Food in the Last Year: Never true  ?Transportation Needs: No Transportation Needs  ? Lack of Transportation (Medical): No  ? Lack of Transportation (Non-Medical): No  ?Physical Activity: Inactive  ? Days of Exercise per Week: 0 days  ? Minutes of Exercise per Session: 0 min  ?Stress: Stress Concern Present  ? Feeling of Stress : Rather much  ?Social Connections: Socially Isolated  ? Frequency of Communication with  Friends and Family: More than three times a week  ? Frequency of Social Gatherings with Friends and Family: More than three times a week  ? Attends Religious Services: Never  ? Active Member of Clubs or Organizations: No  ? Attends Archivist Meetings: Never  ? Marital Status: Divorced  ? ? ?Allergies:  ?Allergies  ?Allergen Reactions  ? Ciprofloxacin Nausea And Vomiting  ? Phentermine Other (See Comments)  ?  Tearing, lightheadedness, depression  ? Tape Other (See Comments)  ?  " I get red and itchy." ?Paper tape okay  ? Percocet [Oxycodone-Acetaminophen] Hives and Rash  ? Tetanus Toxoids Nausea And Vomiting  ? Tylenol With Codeine #3 [Acetaminophen-Codeine] Rash  ? ? ?Metabolic Disorder Labs: ?No results found for: HGBA1C, MPG ?No results found for: PROLACTIN ?Lab Results  ?Component Value Date  ? CHOL 215 (H) 07/16/2020  ? TRIG 239 (H) 07/16/2020  ? HDL 54 07/16/2020  ? CHOLHDL 4.0 07/16/2020  ? LDLCALC 119 (H) 07/16/2020  ? LDLCALC 123 (H) 11/17/2017  ? ?Lab Results  ?Component Value Date  ? TSH 2.17 04/17/2021  ? TSH 3.740 10/11/2020  ? ? ?Therapeutic Level Labs: ?No results found for: LITHIUM ?No results found for: VALPROATE ?No components found for:  CBMZ ? ?Current Medications: ?Current Outpatient Medications  ?Medication Sig Dispense Refill  ? buPROPion (WELLBUTRIN XL) 150 MG 24 hr tablet Take 1 tablet (150 mg total) by mouth daily. 30 tablet 1  ? levothyroxine (SYNTHROID) 100 MCG tablet Take 1 tablet by mouth once daily 90 tablet 0  ? albuterol  (VENTOLIN HFA) 108 (90 Base) MCG/ACT inhaler INHALE 2 PUFFS BY MOUTH EVERY 4 HOURS AS NEEDED FOR WHEEZING AND FOR SHORTNESS OF BREATH 18 g 0  ? atorvastatin (LIPITOR) 40 MG tablet Take 1 tablet (40 mg total) by

## 2021-11-17 ENCOUNTER — Other Ambulatory Visit: Payer: Self-pay

## 2021-11-17 ENCOUNTER — Other Ambulatory Visit: Payer: Self-pay | Admitting: Nurse Practitioner

## 2021-11-17 ENCOUNTER — Ambulatory Visit (HOSPITAL_COMMUNITY): Payer: 59

## 2021-11-17 ENCOUNTER — Other Ambulatory Visit: Payer: Self-pay | Admitting: Psychiatry

## 2021-11-17 ENCOUNTER — Encounter: Payer: Self-pay | Admitting: Psychiatry

## 2021-11-17 ENCOUNTER — Telehealth (INDEPENDENT_AMBULATORY_CARE_PROVIDER_SITE_OTHER): Payer: 59 | Admitting: Psychiatry

## 2021-11-17 DIAGNOSIS — F451 Undifferentiated somatoform disorder: Secondary | ICD-10-CM | POA: Diagnosis not present

## 2021-11-17 DIAGNOSIS — F411 Generalized anxiety disorder: Secondary | ICD-10-CM

## 2021-11-17 DIAGNOSIS — F330 Major depressive disorder, recurrent, mild: Secondary | ICD-10-CM

## 2021-11-17 MED ORDER — BUPROPION HCL ER (XL) 150 MG PO TB24: 150.0000 mg | ORAL_TABLET | Freq: Every day | ORAL | 1 refills | Status: DC

## 2021-11-17 NOTE — Patient Instructions (Signed)
Continue duloxetine 120 mg at night  ?Next appointment: 5/11 at 1:40  ?

## 2021-11-25 ENCOUNTER — Other Ambulatory Visit: Payer: Self-pay | Admitting: Nurse Practitioner

## 2021-12-17 ENCOUNTER — Ambulatory Visit (INDEPENDENT_AMBULATORY_CARE_PROVIDER_SITE_OTHER): Payer: 59 | Admitting: Vascular Surgery

## 2021-12-17 ENCOUNTER — Encounter: Payer: Self-pay | Admitting: Vascular Surgery

## 2021-12-17 ENCOUNTER — Ambulatory Visit: Payer: 59

## 2021-12-17 ENCOUNTER — Other Ambulatory Visit (HOSPITAL_COMMUNITY): Payer: Self-pay | Admitting: Vascular Surgery

## 2021-12-17 VITALS — BP 119/88 | HR 89 | Temp 99.1°F | Resp 18 | Ht 61.0 in | Wt 199.4 lb

## 2021-12-17 DIAGNOSIS — I6529 Occlusion and stenosis of unspecified carotid artery: Secondary | ICD-10-CM

## 2021-12-17 DIAGNOSIS — I6523 Occlusion and stenosis of bilateral carotid arteries: Secondary | ICD-10-CM | POA: Diagnosis not present
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
## 2021-12-19 ENCOUNTER — Other Ambulatory Visit: Payer: Self-pay | Admitting: Nurse Practitioner

## 2021-12-19 DIAGNOSIS — M65321 Trigger finger, right index finger: Secondary | ICD-10-CM

## 2021-12-19 DIAGNOSIS — M5416 Radiculopathy, lumbar region: Secondary | ICD-10-CM

## 2021-12-19 DIAGNOSIS — G5601 Carpal tunnel syndrome, right upper limb: Secondary | ICD-10-CM

## 2021-12-19 DIAGNOSIS — M542 Cervicalgia: Secondary | ICD-10-CM

## 2021-12-22 ENCOUNTER — Telehealth (INDEPENDENT_AMBULATORY_CARE_PROVIDER_SITE_OTHER): Payer: 59 | Admitting: Nurse Practitioner

## 2021-12-25 ENCOUNTER — Ambulatory Visit (INDEPENDENT_AMBULATORY_CARE_PROVIDER_SITE_OTHER): Payer: 59 | Admitting: Nurse Practitioner

## 2021-12-25 ENCOUNTER — Ambulatory Visit (HOSPITAL_BASED_OUTPATIENT_CLINIC_OR_DEPARTMENT_OTHER)
Admission: RE | Admit: 2021-12-25 | Discharge: 2021-12-25 | Disposition: A | Discharge status: Home / Self Care | Payer: 59 | Source: Ambulatory Visit | Attending: Nurse Practitioner | Admitting: Nurse Practitioner

## 2021-12-25 ENCOUNTER — Encounter (HOSPITAL_BASED_OUTPATIENT_CLINIC_OR_DEPARTMENT_OTHER): Payer: Self-pay | Admitting: Nurse Practitioner

## 2021-12-25 VITALS — BP 117/72 | HR 92 | Ht 61.0 in | Wt 198.2 lb

## 2021-12-25 DIAGNOSIS — R052 Subacute cough: Secondary | ICD-10-CM | POA: Insufficient documentation

## 2021-12-25 DIAGNOSIS — R590 Localized enlarged lymph nodes: Secondary | ICD-10-CM

## 2021-12-25 DIAGNOSIS — L0291 Cutaneous abscess, unspecified: Secondary | ICD-10-CM

## 2021-12-25 MED ORDER — DEXAMETHASONE SODIUM PHOSPHATE 10 MG/ML IJ SOLN: 10.0000 mg | Freq: Once | INTRAMUSCULAR | Status: DC

## 2021-12-25 MED ORDER — SULFAMETHOXAZOLE-TRIMETHOPRIM 800-160 MG PO TABS
1.0000 | ORAL_TABLET | Freq: Two times a day (BID) | ORAL | 0 refills | Status: DC
Start: 2021-12-25 — End: 2022-01-08

## 2021-12-25 MED ORDER — MUPIROCIN 2 % EX OINT: TOPICAL_OINTMENT | CUTANEOUS | 3 refills | Status: DC

## 2021-12-25 NOTE — Patient Instructions (Signed)
It was a pleasure seeing you today. I hope your time spent with [place] was pleasant and helpful. Please let us know if there is anything we can do to improve the service you receive.  ? ?Today we discussed concerns with: ? ?Subacute cough ? ?Abscess ? ?Unisom (doxyalamine succinate)  ?I sent in the antibiotic for the place on your breast and for the upper respiratory infection.  ?You can have the x-ray done downstairs on the ground floor ?If this doesn't get any better, please let me know ?Try heating pad on the underarm to see if this helps ? ?The following orders have been placed for you today: ? ?Orders Placed This Encounter  ?Procedures  ? DG Chest 2 View  ?  Order Specific Question:   Reason for Exam (SYMPTOM  OR DIAGNOSIS REQUIRED)  ?  Answer:   cough, wheezing  ?  Order Specific Question:   Is patient pregnant?  ?  Answer:   No  ?  Order Specific Question:   Preferred imaging location?  ?  Answer:   [facility]  ?  Order Specific Question:   Release to patient  ?  Answer:   Immediate  ?  Order Specific Question:   Radiology Contrast Protocol - do NOT remove file path  ?  Answer:   [email or web address]  ? ? ? ?Important Office Information ?Lab Results ?If labs were ordered, please note that you will see results through [place] [facility] as soon as they come available from [place].  ?It takes up to 5 business days for the results to be routed to me and for me to review them once all of the lab results have come through from [facility]. I will make recommendations based on your results and send these through [place] or someone from the office will call you to discuss. If your labs are abnormal, we may contact you to schedule a visit to discuss the results and make recommendations.  ?If you have not heard from [place] within 5 business days or you have waited longer than a week and your lab results have not come through on [place], please feel free to call the office or send a message  through [place] to follow-up on these labs.  ? ?Referrals ?If referrals were placed today, the office where the referral was sent will contact you either by phone or through [place] to set up scheduling. Please note that it can take up to a week for the referral office to contact you. If you do not hear from them in a week, please contact the referral office directly to inquire about scheduling.  ? ?Condition Treated ?If your condition worsens or you begin to have new symptoms, please schedule a follow-up appointment for further evaluation. If you are not sure if an appointment is needed, you may call the office to leave a message for the nurse and someone will contact you with recommendations.  ?If you have an urgent or life threatening emergency, please do not call the office, but seek emergency evaluation by calling 911 or going to the nearest emergency room for evaluation.  ? ?MyChart and Phone Calls ?Please do not use MyChart for urgent messages. It may take up to 3 business days for MyChart messages to be read by staff and if they are unable to handle the request, an additional 3 business days for them to be routed to me and for my response.  ?Messages sent to the provider through [place] do not come directly to  the provider, please allow time for these messages to be routed and for me to respond.  ?We get a large volume of MyChart messages daily and these are responded to in the order received.  ? ?For urgent messages, please call the office at [phone] and speak with the front office staff or leave a message on the line of my assistant for guidance.  ?We are seeing patients from the hours of 8:00 am through 5:00 pm and calls directly to the nurse may not be answered immediately due to seeing patients, but your call will be returned as soon as possible.  ?Phone  messages received after 4:00 PM Monday through Thursday may not be returned until the following business day. Phone messages received after 11:00  AM on Friday may not be returned until Monday.  ? ?After Hours ?We share on call hours with providers from other offices. If you have an urgent need after hours that cannot wait until the next business day, please contact the on call provider by calling the office number. A nurse will speak with you and contact the provider if needed for recommendations.  ?If you have an urgent or life threatening emergency after hours, please do not call the on call provider, but seek emergency evaluation by calling 911 or going to the nearest emergency room for evaluation.  ? ?Paperwork ?All paperwork requires a minimum of 5 days to complete and return to you or the designated personnel. Please keep this in mind when bringing in forms or sending requests for paperwork completion to the office.  ?  ?

## 2021-12-25 NOTE — Progress Notes (Signed)
? ?Acute Office Visit ? ?Subjective:  ? ?  ?Patient ID: Laura Burnett, female    DOB: 1966/02/12, 56 y.o.   MRN: [id number] ? ?No chief complaint on file. ? ? ?HPI ?Patient is in today for cough and congestion. ?She reports symptoms started approximately 2 weeks ago with postnasal drip and dry cough ?Chest congestion and headache symptoms have been present for approximately 1 week. ?She also endorses a sensation of tightness in her chest specifically when coughing. ?She does not believe she has had any fevers.  She denies any chills. ?She is experiencing ear pressure and fullness bilaterally. ?She has not taken anything for this ? ?Lymph node enlargement ?She endorses an enlarged lymph node in the left axilla ?She is unsure how long this has been present but noticed it within the last several weeks ?She reports this area is tender to the touch ?She has recently had an abscess on her left breast which is improved ?She believes that the lymph node enlargement started around the same time as the abscess ?She does not feel that the lump is changing in size ?There are no changes to the skin, rashes, fever, chills, night sweats ? ?ROS ?All review of systems negative except what is listed in the HPI ? ?   ?Objective:  ?  ?BP 117/72   Pulse 92   Ht '5\' 1"'$  (1.549 m)   Wt 198 lb 3.2 oz (89.9 kg)   SpO2 97%   BMI 37.45 kg/m?  ? ?Physical Exam ?Vitals and nursing note reviewed.  ?Constitutional:   ?   Appearance: Normal appearance. She is ill-appearing.  ?HENT:  ?   Head: Normocephalic.  ?   Right Ear: Tenderness present. A middle ear effusion is present. Tympanic membrane is bulging.  ?   Left Ear: Tenderness present. A middle ear effusion is present. Tympanic membrane is bulging.  ?   Nose: Mucosal edema and rhinorrhea present. Rhinorrhea is purulent.  ?   Right Turbinates: Enlarged, swollen and pale.  ?   Left Turbinates: Enlarged, swollen and pale.  ?   Right Sinus: Frontal sinus tenderness present.  ?   Left Sinus:  Frontal sinus tenderness present.  ?   Mouth/Throat:  ?   Mouth: Mucous membranes are moist.  ?   Pharynx: Uvula midline. Posterior oropharyngeal erythema present. No oropharyngeal exudate or uvula swelling.  ?Cardiovascular:  ?   Rate and Rhythm: Normal rate and regular rhythm.  ?   Pulses: Normal pulses.  ?   Heart sounds: Normal heart sounds.  ?Pulmonary:  ?   Effort: Pulmonary effort is normal.  ?   Breath sounds: Wheezing and rhonchi present.  ?Chest:  ?   Chest wall: Tenderness present.  ? ? ?Abdominal:  ?   General: Bowel sounds are normal.  ?   Palpations: Abdomen is soft.  ?Lymphadenopathy:  ?   Cervical: Cervical adenopathy present.  ?   Upper Body:  ?   Left upper body: Axillary adenopathy present.  ?Skin: ?   General: Skin is warm and dry.  ?   Capillary Refill: Capillary refill takes less than 2 seconds.  ?Neurological:  ?   General: No focal deficit present.  ?   Mental Status: She is alert and oriented to person, place, and time.  ?Psychiatric:     ?   Mood and Affect: Mood normal.     ?   Behavior: Behavior normal.     ?   Thought Content: Thought  content normal.     ?   Judgment: Judgment normal.  ? ? ?No results found for any visits on 12/25/21. ? ? ?   ?Assessment & Plan:  ? ?Problem List Items Addressed This Visit   ? ? Subacute cough - Primary  ?  Ongoing cough with congestion.  Suspect allergy exacerbation may be contributing to symptoms.  Also suspect possible bacterial etiology given the length of time symptoms have been present and the associated symptoms.  In the setting of recent breast abscess and current upper respiratory type infection will send treatment today with Bactrim oral therapy which should cover both areas.  We will also obtain chest x-ray as the cough has been ongoing for quite some time. ?Recommend starting over-the-counter allergy medication such as Claritin, Zyrtec, Allegra, Xyzal to help with allergy symptoms which are likely contributing to the onset of the cough.  Also  recommend utilization of intranasal quarter go steroid spray such as Flonase, Rhinocort, Nasacort or similar. ?Given current symptoms will provide Decadron injection today. ?Recommend close monitoring and follow-up if symptoms worsen or fail to improve. ?We will make changes to plan of care based on findings from chest x-ray today. ? ?  ?  ? Relevant Medications  ? dexamethasone (DECADRON) injection 10 mg  ? Other Relevant Orders  ? DG Chest 2 View (Completed)  ? Abscess  ?  Abscess to the left breast approximately the 6:00 location.  It appears to be healing however there is remaining erythema present.  No drainage at this time.  I do suspect that the abscess has triggered the lymphadenopathy noted in the left axillary region however I do strongly encouraged patient to continue to monitor this area and report any new or worsening symptoms immediately.  We will start oral antibiotics today with Bactroban to help to completely clear the abscess area as well as treat the upper respiratory infection present.  We will also send treatment with Bactroban ointment to apply to abscessed area.  Recommend monitoring this area closely for healing.  If healing does not occur or any changes continue to occur to the area please report immediately. ?Patient will need a mammogram in the near future however I would like to have resolution of this area prior to mammogram and exam.  If area is not improved in the next 4 weeks will go ahead with mammogram screening. ? ?  ?  ? Relevant Medications  ? mupirocin ointment (BACTROBAN) 2 %  ? sulfamethoxazole-trimethoprim (BACTRIM DS) 800-160 MG tablet  ? Axillary adenopathy  ?  Approximate 1.5 cm palpable axillary nodularity noted.  Soft, freely mobile with palpation.  No changes to the skin.  Given the recent breast abscess suspect that this is related to healing infection.  Discussed with patient the option to monitor closely versus axillary ultrasound for further evaluation.  Given the  length of time nodularity has been present and recent infection we will continue to monitor.  Low threshold for ultrasound in the near future if symptoms do not improve or any changes are noted. ? ?  ?  ? ? ?Meds ordered this encounter  ?Medications  ? mupirocin ointment (BACTROBAN) 2 %  ?  Sig: Apply to affected area TID for 7 days.  ?  Dispense:  30 g  ?  Refill:  3  ? sulfamethoxazole-trimethoprim (BACTRIM DS) 800-160 MG tablet  ?  Sig: Take 1 tablet by mouth 2 (two) times daily.  ?  Dispense:  14 tablet  ?  Refill:  0  ? dexamethasone (DECADRON) injection 10 mg  ? ? ?Return if symptoms worsen or fail to improve. ? ?Orma Render, NP ? ? ?

## 2022-01-03 DIAGNOSIS — R590 Localized enlarged lymph nodes: Secondary | ICD-10-CM | POA: Insufficient documentation

## 2022-01-03 DIAGNOSIS — L0291 Cutaneous abscess, unspecified: Secondary | ICD-10-CM

## 2022-01-03 HISTORY — DX: Cutaneous abscess, unspecified: L02.91

## 2022-01-03 NOTE — Assessment & Plan Note (Signed)
Approximate 1.5 cm palpable axillary nodularity noted.  Soft, freely mobile with palpation.  No changes to the skin.  Given the recent breast abscess suspect that this is related to healing infection.  Discussed with patient the option to monitor closely versus axillary ultrasound for further evaluation.  Given the length of time nodularity has been present and recent infection we will continue to monitor.  Low threshold for ultrasound in the near future if symptoms do not improve or any changes are noted. ?

## 2022-01-03 NOTE — Assessment & Plan Note (Signed)
Ongoing cough with congestion.  Suspect allergy exacerbation may be contributing to symptoms.  Also suspect possible bacterial etiology given the length of time symptoms have been present and the associated symptoms.  In the setting of recent breast abscess and current upper respiratory type infection will send treatment today with Bactrim oral therapy which should cover both areas.  We will also obtain chest x-ray as the cough has been ongoing for quite some time. ?Recommend starting over-the-counter allergy medication such as Claritin, Zyrtec, Allegra, Xyzal to help with allergy symptoms which are likely contributing to the onset of the cough.  Also recommend utilization of intranasal quarter go steroid spray such as Flonase, Rhinocort, Nasacort or similar. ?Given current symptoms will provide Decadron injection today. ?Recommend close monitoring and follow-up if symptoms worsen or fail to improve. ?We will make changes to plan of care based on findings from chest x-ray today. ?

## 2022-01-03 NOTE — Assessment & Plan Note (Signed)
Abscess to the left breast approximately the 6:00 location.  It appears to be healing however there is remaining erythema present.  No drainage at this time.  I do suspect that the abscess has triggered the lymphadenopathy noted in the left axillary region however I do strongly encouraged patient to continue to monitor this area and report any new or worsening symptoms immediately.  We will start oral antibiotics today with Bactroban to help to completely clear the abscess area as well as treat the upper respiratory infection present.  We will also send treatment with Bactroban ointment to apply to abscessed area.  Recommend monitoring this area closely for healing.  If healing does not occur or any changes continue to occur to the area please report immediately. ?Patient will need a mammogram in the near future however I would like to have resolution of this area prior to mammogram and exam.  If area is not improved in the next 4 weeks will go ahead with mammogram screening. ?

## 2022-01-04 NOTE — Progress Notes (Signed)
Appt cancelled and rescheduled

## 2022-01-07 NOTE — Progress Notes (Signed)
Virtual Visit via Video Note ? ?I connected with Laura Burnett on 01/08/22 at  1:40 PM EDT by a video enabled telemedicine application and verified that I am speaking with the correct person using two identifiers. ? ?Location: ?Patient: home ?Provider: office ?Persons participated in the visit- patient, provider  ?  ?I discussed the limitations of evaluation and management by telemedicine and the availability of in person appointments. The patient expressed understanding and agreed to proceed. ? ? ?  ?I discussed the assessment and treatment plan with the patient. The patient was provided an opportunity to ask questions and all were answered. The patient agreed with the plan and demonstrated an understanding of the instructions. ?  ?The patient was advised to call back or seek an in-person evaluation if the symptoms worsen or if the condition fails to improve as anticipated. ? ?I provided 11 minutes of non-face-to-face time during this encounter. ? ? ?Norman Clay, MD ? ? ? ?BH MD/PA/NP OP Progress Note ? ?01/08/2022 1:56 PM ?Laura Burnett  ?MRN:  [id number] ? ?Chief Complaint:  ?Chief Complaint  ?Patient presents with  ? Follow-up  ? Depression  ? ?HPI:  ?This is a follow-up appointment for depression and anxiety.  ?She states that although she bupropion when she was started on it, she felt she was getting used to it after a week.  She had a bronchitis, and she has been feeling more tired since then. She feels drained, although she enjoys seeing her granddaughter.  She is trying to get out, and enjoys flowers.  She feels anxious depends on the situation such as talking with others.  She also feels anxious even when she is by herself.  She denies panic attacks.  She reports myalgia, and pain secondary to scoliosis as well as neck pain.  She sleeps fair.  She denies change in appetite.  She denies SI.  She denies panic attacks.  She is willing to try higher dose of bupropion at this time.  ? ?Wt Readings from Last 3  Encounters:  ?12/25/21 198 lb 3.2 oz (89.9 kg)  ?12/17/21 199 lb 6.4 oz (90.4 kg)  ?10/27/21 200 lb 3.2 oz (90.8 kg)  ?  ? ?Daily routine: painting furniture, puzzles, sewing at times,  visits her granddaughter, who is 58 yo old 2022 ?Employment: unemployed, on disability for pain ?Household:  self ?Marital status: divorced ?Her father deceased a few years ago from CHF, mother in 2011 from strokes ?Children: 1 son, age 23 ?  ? ?Visit Diagnosis:  ?  ICD-10-CM   ?1. MDD (major depressive disorder), recurrent episode, mild ([place])  F33.0   ?  ?2. GAD (generalized anxiety disorder)  F41.1 DULoxetine (CYMBALTA) 60 MG capsule  ?  ?3. Somatic symptom disorder  F45.1   ?  ? ? ?Past Psychiatric History: Please see initial evaluation for full details. I have reviewed the history. No updates at this time.  ?  ? ?Past Medical History:  ?Past Medical History:  ?Diagnosis Date  ? Allergy   ? seasonal allergies/animals  ? Anxiety   ? Arthritis   ? Asthma   ? as a child " exercise induced"  ? Bronchitis   ? Constipation   ? Depression   ? DJD (degenerative joint disease)   ? Fibromyalgia   ? GERD (gastroesophageal reflux disease)   ? Headache   ? Hypothyroidism   ? Obesity   ? Perioral dermatitis 03/12/2021  ? PONV (postoperative nausea and vomiting)   ?  Wears glasses   ?  ?Past Surgical History:  ?Procedure Laterality Date  ? ABDOMINAL HYSTERECTOMY    ? ADENOIDECTOMY    ? APPLICATION OF ROBOTIC ASSISTANCE FOR SPINAL PROCEDURE N/A 12/21/2016  ? Procedure: APPLICATION OF ROBOTIC ASSISTANCE FOR SPINAL PROCEDURE;  Surgeon: Kevan Ny Ditty, MD;  Location: [facility];  Service: Neurosurgery;  Laterality: N/A;  ? BACK SURGERY    ? L5-S1  ? CARPAL TUNNEL RELEASE Right 01/16/2019  ? Procedure: RIGHT CARPAL TUNNEL RELEASE;  Surgeon: [name], MD;  Location: [facility];  Service: Orthopedics;  Laterality: Right;  ? COLONOSCOPY    ? CYST EXCISION    ? uterine ;subsequent sx followed  ? FOOT SURGERY    ? HAND SURGERY    ? from [place]   ? KNEE ARTHROSCOPY    ? x2  ? TONSILLECTOMY    ? TRIGGER FINGER RELEASE Right 01/16/2019  ? Procedure: RIGHT TRIGGER THUMB RELEASE;  Surgeon: [name], MD;  Location: [facility];  Service: Orthopedics;  Laterality: Right;  ? TRIGGER FINGER RELEASE Right 12/04/2019  ? Procedure: right index trigger finger release;  Surgeon: [name], MD;  Location: [facility];  Service: Orthopedics;  Laterality: Right;  ? UPPER GASTROINTESTINAL ENDOSCOPY    ? ? ?Family Psychiatric History: Please see initial evaluation for full details. I have reviewed the history. No updates at this time.  ?  ? ?Family History:  ?Family History  ?Problem Relation Age of Onset  ? Alzheimer's disease Mother   ? Diabetes Mother   ? Heart attack Father   ? Colon cancer Father   ? Heart failure Father   ? Thyroid disease Sister   ? Thyroid disease Brother   ? Stomach cancer Brother   ? Cancer Brother   ? Cancer Brother   ? ADD / ADHD Other   ? Esophageal cancer Neg Hx   ? Rectal cancer Neg Hx   ? ? ?Social History:  ?Social History  ? ?Socioeconomic History  ? Marital status: Divorced  ?  Spouse name: Not on file  ? Number of children: 1  ? Years of education: 12  ? Highest education level: High school graduate  ?Occupational History  ? Occupation: Disabled   ?Tobacco Use  ? Smoking status: Former  ? Smokeless tobacco: Never  ? Tobacco comments:  ?  stopped smoking cigarettes at age 37  ?Vaping Use  ? Vaping Use: Never used  ?Substance and Sexual Activity  ? Alcohol use: Not Currently  ? Drug use: No  ? Sexual activity: Not Currently  ?Other Topics Concern  ? Not on file  ?Social History Narrative  ? Patient lives alone in [place], [place].  ? Patient has one son who lives nearby.   ? Patient has 2 grandchildren.   ? Patient enjoys sewing, being outside in her garden, and spending time with family.   ? ?Social Determinants of Health  ? ?Financial Resource Strain: Not on file  ?Food Insecurity: Not on file  ?Transportation  Needs: Not on file  ?Physical Activity: Not on file  ?Stress: Not on file  ?Social Connections: Not on file  ? ? ?Allergies:  ?Allergies  ?Allergen Reactions  ? Ciprofloxacin Nausea And Vomiting  ? Phentermine Other (See Comments)  ?  Tearing, lightheadedness, depression  ? Tape Other (See Comments)  ?  " I get red and itchy." ?Paper tape okay  ? Bactrim [Sulfamethoxazole-Trimethoprim] Swelling and Rash  ?  Percocet [Oxycodone-Acetaminophen] Hives and Rash  ? Tetanus Toxoids Nausea And Vomiting  ? Tylenol With Codeine #3 [Acetaminophen-Codeine] Rash  ? ? ?Metabolic Disorder Labs: ?No results found for: HGBA1C, MPG ?No results found for: PROLACTIN ?Lab Results  ?Component Value Date  ? CHOL 215 (H) 07/16/2020  ? TRIG 239 (H) 07/16/2020  ? HDL 54 07/16/2020  ? CHOLHDL 4.0 07/16/2020  ? LDLCALC 119 (H) 07/16/2020  ? LDLCALC 123 (H) 11/17/2017  ? ?Lab Results  ?Component Value Date  ? TSH 2.17 04/17/2021  ? TSH 3.740 10/11/2020  ? ? ?Therapeutic Level Labs: ?No results found for: LITHIUM ?No results found for: VALPROATE ?No components found for:  CBMZ ? ?Current Medications: ?Current Outpatient Medications  ?Medication Sig Dispense Refill  ? albuterol (VENTOLIN HFA) 108 (90 Base) MCG/ACT inhaler INHALE 2 PUFFS BY MOUTH EVERY 4 HOURS AS NEEDED FOR WHEEZING AND FOR SHORTNESS OF BREATH 18 g 0  ? atorvastatin (LIPITOR) 40 MG tablet Take 1 tablet (40 mg total) by mouth daily. 90 tablet 3  ? azelastine (ASTELIN) 0.1 % nasal spray Place 2 sprays into both nostrils 2 (two) times daily. Use in each nostril as directed 30 mL 12  ? buPROPion (WELLBUTRIN XL) 300 MG 24 hr tablet Take 1 tablet (300 mg total) by mouth daily. 30 tablet 1  ? cyclobenzaprine (FLEXERIL) 10 MG tablet TAKE 1 TABLET BY MOUTH THREE TIMES DAILY AS NEEDED FOR MUSCLE SPASM. WORK TO TAPER TO TWICE A DAY MAX 270 tablet 0  ? docusate sodium (COLACE) 100 MG capsule Take 100 mg by mouth 2 (two) times daily.    ? [START ON 02/16/2022] DULoxetine (CYMBALTA) 60 MG capsule  Take 2 capsules (120 mg total) by mouth at bedtime. 180 capsule 1  ? fluticasone (FLONASE) 50 MCG/ACT nasal spray Place 2 sprays into both nostrils daily. 16 g 6  ? gabapentin (NEURONTIN) 800 MG tablet

## 2022-01-08 ENCOUNTER — Telehealth (INDEPENDENT_AMBULATORY_CARE_PROVIDER_SITE_OTHER): Payer: 59 | Admitting: Psychiatry

## 2022-01-08 ENCOUNTER — Encounter: Payer: Self-pay | Admitting: Psychiatry

## 2022-01-08 ENCOUNTER — Encounter (HOSPITAL_BASED_OUTPATIENT_CLINIC_OR_DEPARTMENT_OTHER): Payer: Self-pay | Admitting: Nurse Practitioner

## 2022-01-08 DIAGNOSIS — F411 Generalized anxiety disorder: Secondary | ICD-10-CM

## 2022-01-08 DIAGNOSIS — F330 Major depressive disorder, recurrent, mild: Secondary | ICD-10-CM | POA: Diagnosis not present

## 2022-01-08 DIAGNOSIS — F451 Undifferentiated somatoform disorder: Secondary | ICD-10-CM

## 2022-01-08 MED ORDER — BUPROPION HCL ER (XL) 300 MG PO TB24: 300.0000 mg | ORAL_TABLET | Freq: Every day | ORAL | 1 refills | Status: DC

## 2022-01-08 MED ORDER — DULOXETINE HCL 60 MG PO CPEP: 120.0000 mg | ORAL_CAPSULE | Freq: Every day | ORAL | 1 refills | Status: DC

## 2022-01-08 NOTE — Patient Instructions (Signed)
Continue duloxetine 120 mg at night  ?Increase bupropion 300 mg daily  ?Next appointment: 7/10 at 1:20 ?

## 2022-01-26 ENCOUNTER — Other Ambulatory Visit: Payer: Self-pay | Admitting: Nurse Practitioner

## 2022-02-05 ENCOUNTER — Other Ambulatory Visit (HOSPITAL_BASED_OUTPATIENT_CLINIC_OR_DEPARTMENT_OTHER): Payer: Self-pay | Admitting: Nurse Practitioner

## 2022-02-05 DIAGNOSIS — J324 Chronic pansinusitis: Secondary | ICD-10-CM

## 2022-02-14 ENCOUNTER — Other Ambulatory Visit: Payer: Self-pay | Admitting: Nurse Practitioner

## 2022-02-17 ENCOUNTER — Other Ambulatory Visit: Payer: Self-pay | Admitting: Otolaryngology

## 2022-02-17 DIAGNOSIS — J329 Chronic sinusitis, unspecified: Secondary | ICD-10-CM

## 2022-03-01 ENCOUNTER — Other Ambulatory Visit: Payer: Self-pay | Admitting: Psychiatry

## 2022-03-04 ENCOUNTER — Other Ambulatory Visit (HOSPITAL_BASED_OUTPATIENT_CLINIC_OR_DEPARTMENT_OTHER): Payer: Self-pay | Admitting: Nurse Practitioner

## 2022-03-04 DIAGNOSIS — J324 Chronic pansinusitis: Secondary | ICD-10-CM
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
## 2022-03-09 ENCOUNTER — Telehealth (INDEPENDENT_AMBULATORY_CARE_PROVIDER_SITE_OTHER): Payer: 59 | Admitting: Psychiatry

## 2022-03-09 ENCOUNTER — Encounter: Payer: Self-pay | Admitting: Psychiatry

## 2022-03-09 DIAGNOSIS — F451 Undifferentiated somatoform disorder: Secondary | ICD-10-CM | POA: Diagnosis not present

## 2022-03-09 DIAGNOSIS — F330 Major depressive disorder, recurrent, mild: Secondary | ICD-10-CM

## 2022-03-09 DIAGNOSIS — F411 Generalized anxiety disorder: Secondary | ICD-10-CM

## 2022-03-09 MED ORDER — BUPROPION HCL ER (XL) 150 MG PO TB24: 450.0000 mg | ORAL_TABLET | Freq: Every day | ORAL | 0 refills | Status: DC

## 2022-03-09 NOTE — Addendum Note (Signed)
Addended by: Norman Clay on: 03/09/2022 03:57 PM   Modules accepted: Orders

## 2022-03-10 ENCOUNTER — Other Ambulatory Visit (HOSPITAL_BASED_OUTPATIENT_CLINIC_OR_DEPARTMENT_OTHER): Payer: Self-pay

## 2022-03-10 DIAGNOSIS — J324 Chronic pansinusitis: Secondary | ICD-10-CM

## 2022-03-10 MED ORDER — MONTELUKAST SODIUM 10 MG PO TABS: 10.0000 mg | ORAL_TABLET | Freq: Every day | ORAL | 0 refills | Status: DC

## 2022-03-11 ENCOUNTER — Encounter: Payer: Self-pay | Admitting: Psychiatry

## 2022-03-11 ENCOUNTER — Telehealth: Payer: Self-pay

## 2022-03-11 ENCOUNTER — Encounter (HOSPITAL_BASED_OUTPATIENT_CLINIC_OR_DEPARTMENT_OTHER): Payer: Self-pay | Admitting: Nurse Practitioner

## 2022-03-11 LAB — CBC
Hematocrit: 41.1 % (ref 34.0–46.6)
Hemoglobin: 13.4 g/dL (ref 11.1–15.9)
MCH: 29.4 pg (ref 26.6–33.0)
MCHC: 32.6 g/dL (ref 31.5–35.7)
MCV: 90 fL (ref 79–97)
Platelets: 351 10*3/uL (ref 150–450)
RBC: 4.56 x10E6/uL (ref 3.77–5.28)
RDW: 13.4 % (ref 11.7–15.4)
WBC: 6.5 10*3/uL (ref 3.4–10.8)

## 2022-03-11 LAB — TSH: TSH: 8.36 u[IU]/mL — ABNORMAL HIGH (ref 0.450–4.500)

## 2022-03-11 LAB — FERRITIN: Ferritin: 19 ng/mL (ref 15–150)

## 2022-03-11 NOTE — Telephone Encounter (Signed)
pt called states she had her labwork done and she waiting for you to give results.

## 2022-03-11 NOTE — Progress Notes (Signed)
Could you call the patient, and update on the lab results.  - TSH (thyroid) is slightly higher than normal range. We are currently asking lab to see if we can add free T4 to assess this.  - Ferritin level is in lower range. I would recommend she contact her primary care to address this given it can cause fatigue.

## 2022-03-11 NOTE — Telephone Encounter (Signed)
Could you look at the message I sent to you earlier about her lab result, and inform her of it. Please also inform her that I sent it electronically as well. Thanks.

## 2022-03-12 ENCOUNTER — Other Ambulatory Visit (HOSPITAL_BASED_OUTPATIENT_CLINIC_OR_DEPARTMENT_OTHER): Payer: Self-pay | Admitting: Nurse Practitioner

## 2022-03-12 DIAGNOSIS — E611 Iron deficiency: Secondary | ICD-10-CM

## 2022-03-12 MED ORDER — IRON (FERROUS SULFATE) 325 (65 FE) MG PO TABS: ORAL_TABLET | ORAL | 1 refills | Status: DC

## 2022-03-12 NOTE — Progress Notes (Signed)
Could you update in addition to the previous comment.  -  free T4 (thyroid test) is in normal range. I would recommend she contacts her primary care for monitoring/treatment if needed.

## 2022-03-13 LAB — T4, FREE: Free T4: 1.06 ng/dL (ref 0.82–1.77)

## 2022-03-13 LAB — SPECIMEN STATUS REPORT

## 2022-03-16 NOTE — Telephone Encounter (Signed)
spoke with patient and gave her the results she states she has an appt with her primary for 8-17-23with dr. Clarise Cruz Early.

## 2022-03-18 ENCOUNTER — Ambulatory Visit (INDEPENDENT_AMBULATORY_CARE_PROVIDER_SITE_OTHER): Payer: 59 | Admitting: Nurse Practitioner

## 2022-03-18 ENCOUNTER — Encounter (HOSPITAL_BASED_OUTPATIENT_CLINIC_OR_DEPARTMENT_OTHER): Payer: Self-pay | Admitting: Nurse Practitioner

## 2022-03-18 ENCOUNTER — Ambulatory Visit
Admission: RE | Admit: 2022-03-18 | Discharge: 2022-03-18 | Disposition: A | Discharge status: Home / Self Care | Payer: 59 | Source: Ambulatory Visit | Attending: Otolaryngology | Admitting: Otolaryngology

## 2022-03-18 VITALS — BP 128/88 | HR 98 | Ht 61.0 in | Wt 195.0 lb

## 2022-03-18 DIAGNOSIS — R140 Abdominal distension (gaseous): Secondary | ICD-10-CM | POA: Diagnosis not present

## 2022-03-18 DIAGNOSIS — E010 Iodine-deficiency related diffuse (endemic) goiter: Secondary | ICD-10-CM

## 2022-03-18 DIAGNOSIS — J329 Chronic sinusitis, unspecified: Secondary | ICD-10-CM

## 2022-03-18 DIAGNOSIS — R790 Abnormal level of blood mineral: Secondary | ICD-10-CM

## 2022-03-18 DIAGNOSIS — E039 Hypothyroidism, unspecified: Secondary | ICD-10-CM | POA: Diagnosis not present

## 2022-03-18 DIAGNOSIS — R599 Enlarged lymph nodes, unspecified: Secondary | ICD-10-CM | POA: Diagnosis not present

## 2022-03-18 MED ORDER — LEVOTHYROXINE SODIUM 150 MCG PO TABS: 150.0000 ug | ORAL_TABLET | Freq: Every day | ORAL | 3 refills | Status: DC

## 2022-03-18 NOTE — Assessment & Plan Note (Signed)
Review of labs performed by psychiatry today.  Thyroid levels are considerably higher than they have been in the past.  She has not had any changes to her levothyroxine and she denies any noticeable brand changes from the pharmacy.  She does report the only difference is start of bupropion recently, however this is very unlikely the causative factor.  Given the increased thyroid levels and her current symptoms I do feel that increased levothyroxine would be beneficial for her.  Will increase her dose to 150 mcg a day.  Patient instructed to take 1-1/2 tablets of her 100 mcg tabs until she has run out of the current prescription and then she may pick up the new dose and 1 tab at the pharmacy.  We will plan to recheck the labs in approximately 6 to 8 weeks to ensure that the dosing is appropriate.

## 2022-03-18 NOTE — Progress Notes (Signed)
Worthy Keeler, DNP, AGNP-c [address] [place], [place] [address] [phone] Office [phone] Fax  ESTABLISHED PATIENT- Chronic Health and/or Follow-Up Visit  Blood pressure 128/88, pulse 98, height '5\' 1"'$  (1.549 m), weight 195 lb (88.5 kg), SpO2 99 %.  Follow-up (Patient had phone visit w/physiatrists and she had blood drawn and tyroid was high and iron was low. Knot on right side of neck. Every disoriented x 2 weeks. Patient states she feel different.)   HPI  Laura Burnett  is a 56 y.o. year old female presenting today for evaluation and management of the following: Thyroid levels Recent labs with psychiatry showed thyroid levels were very elevated-her psychiatrist recommend that she follow-up with her PCP She reports symptoms of ringing in the ears (right worse) for the past 3 weeks-she has not noticed any other concerning symptoms, however she does have a sinus CT at noon today for chronic sinusitis She endorses no energy Feels like she is having to catch her breath when she is not doing anything Iron levels are better- but ferritin on low end Right neck lymph node enlarged- was enlarged for a few weeks, does seem to be getting bigger. She is concerned that this is growing. Abdominal bloating Laura Burnett also endorses concerns with significant abdominal bloating and constipation She has been taking Linzess for quite some time however this is no longer working for her She reports that her abdomen will bloat considerably when she is particularly constipated She has tried multiple laxatives in the past She endorses a very healthy diet, drinking plenty of water, and regular physical activity to help facilitate bowel movements in addition to high levels of fiber She has seen GI in the past and had a colonoscopy which only showed some diverticulosis present She is not having any nausea or pain at this time.  No fevers, chills,  bloody stools.    ROS All ROS negative with exception of what is listed in HPI  PHYSICAL EXAM Physical Exam Vitals and nursing note reviewed.  Constitutional:      General: She is not in acute distress.    Appearance: Normal appearance.  HENT:     Head: Normocephalic.  Eyes:     Extraocular Movements: Extraocular movements intact.     Conjunctiva/sclera: Conjunctivae normal.     Pupils: Pupils are equal, round, and reactive to light.  Neck:     Vascular: No carotid bruit.     Comments: Soft, firm, nodularity noted to the right neck consistent with enlarged lymph node Cardiovascular:     Rate and Rhythm: Normal rate and regular rhythm.     Pulses: Normal pulses.     Heart sounds: Normal heart sounds. No murmur heard. Pulmonary:     Effort: Pulmonary effort is normal.     Breath sounds: Normal breath sounds. No wheezing.  Abdominal:     General: Bowel sounds are normal. There is no distension.     Palpations: Abdomen is soft.     Tenderness: There is no abdominal tenderness. There is no guarding.  Musculoskeletal:        General: Normal range of motion.     Cervical back: Normal range of motion and neck supple.     Right lower leg: No edema.     Left lower leg: No edema.  Lymphadenopathy:     Cervical: No cervical adenopathy.  Skin:    General: Skin is warm and dry.  Capillary Refill: Capillary refill takes less than 2 seconds.  Neurological:     General: No focal deficit present.     Mental Status: She is alert and oriented to person, place, and time.  Psychiatric:        Mood and Affect: Mood normal.        Behavior: Behavior normal.        Thought Content: Thought content normal.        Judgment: Judgment normal.     ASSESSMENT & PLAN Problem List Items Addressed This Visit     Lymph node enlargement    Symptoms and presentation consistent with enlargement of a lymph node on the right posterio lateral neck proximal to the ear.  There does appear to be some  enlargement however the nodularity is rubbery, not fixed, nontender.  Reassurance provided today.  She does have a sinus CT scheduled for later today which she may capture an image of the area if needed.  I have discussed with the patient that if she feels this is getting larger or any new or worsening symptoms develop to let me know and we will plan to follow-up accordingly.  May consider ultrasound of the area if changes are noted.      Low ferritin    Recent labs show low ferritin levels with normal hemoglobin.  At this time there are no signs of bleeding.  She is having some abdominal bloating however no epigastric tenderness or dark stools are present.  We will plan to start iron supplement in an effort to normalize her ferritin levels and monitor closely.      Abdominal bloating    Abdominal bloating of unknown etiology.  Based on patient's symptoms and presentation is very well could be due to chronic constipation.  There are no alarm symptoms present at this time.  Unfortunately it does seem that she has tried multiple medications for her constipation and these do not appear to be working for her any longer.  I did recommend a trial of magnesium citrate to see if this will help facilitate a bowel cleanout and close monitoring.  If symptoms continue recommend referral to GI for further evaluation.  Patient is in agreement and will follow-up if symptoms worsen or fail to improve.      Hypothyroidism - Primary    Review of labs performed by psychiatry today.  Thyroid levels are considerably higher than they have been in the past.  She has not had any changes to her levothyroxine and she denies any noticeable brand changes from the pharmacy.  She does report the only difference is start of bupropion recently, however this is very unlikely the causative factor.  Given the increased thyroid levels and her current symptoms I do feel that increased levothyroxine would be beneficial for her.  Will  increase her dose to 150 mcg a day.  Patient instructed to take 1-1/2 tablets of her 100 mcg tabs until she has run out of the current prescription and then she may pick up the new dose and 1 tab at the pharmacy.  We will plan to recheck the labs in approximately 6 to 8 weeks to ensure that the dosing is appropriate.      Relevant Medications   levothyroxine (SYNTHROID) 150 MCG tablet     FOLLOW-UP Return for 6 weeks labs only thyroid and iron.  Worthy Keeler, DNP, AGNP-c 03/18/2022  8:48 AM

## 2022-03-18 NOTE — Patient Instructions (Addendum)
It was a pleasure seeing you today. I hope your time spent with [place] was pleasant and helpful. Please let us know if there is anything we can do to improve the service you receive.    I have increased your levothyroxine to 134mg daily and we will recheck your labs in about 6 weeks  If the lymph node gets larger or you notice any changes let me know and we will order the ultrasound  Keep taking the iron supplement to get your iron stores up.     Important Office Information Lab Results If labs were ordered, please note that you will see results through [place] soon as they come available from [place]  It takes up to 5 business days for the results to be routed to me and for me to review them once all of the lab [facility] I will make recommendations based on your results and send these through [place] someone from the office will call you to discuss. If your labs are abnormal, we may contact you to schedule a visit to discuss the results and make recommendations.  If you have not heard from [facility] 5 business days or you have waited longer than a week and your lab results have not come through on [facility] please feel free to call the office or send a message through [facility]to follow-up on these labs.   Referrals If referrals were placed today, the office where the referral was sent will contact you either by phone or through [place] set up scheduling. Please note that it can take up to a week for the referral office to contact you. If you do not hear from them in a week, please contact the referral office directly to inquire about scheduling.   Condition Treated If your condition worsens or you begin to have new symptoms, please schedule a follow-up appointment for further evaluation. If you are not sure if an appointment is needed, you may call the office to leave a message for the nurse and someone will contact you with recommendations.  If you have an  urgent or life threatening emergency, please do not call the office, but seek emergency evaluation by calling 911 or going to the nearest emergency room for evaluation.   MyChart and Phone Calls Please do not use MyChart for urgent messages. It may take up to 3 business days for MyChart messages to be read by staff and if they are unable to handle the request, an additional 3 business days for them to be routed to me and for my response.  Messages sent to the provider through [place]do not come directly to the provider, please allow time for these messages to be routed and for me to respond.  We get a large volume of MyChart messages daily and these are responded to in the order received.   For urgent messages, please call the office at [phone] speak with the front office staff or leave a message on the line of my assistant for guidance.  We are seeing patients from the hours of 8:00 am through 5:00 pm and calls directly to the nurse may not be answered immediately due to seeing patients, but your call will be returned as soon as possible.  Phone  messages received after 4:00 PM Monday through Thursday may not be returned until the following business day. Phone messages received after 11:00 AM on Friday may not be returned until Monday.  After Hours We share on call hours with providers from other offices. If you have an urgent need after hours that cannot wait until the next business day, please contact the on call provider by calling the office number. A nurse will speak with you and contact the provider if needed for recommendations.  If you have an urgent or life threatening emergency after hours, please do not call the on call provider, but seek emergency evaluation by calling 911 or going to the nearest emergency room for evaluation.   Paperwork All paperwork requires a minimum of 5 days to complete and return to you or the designated personnel. Please keep this in mind when bringing  in forms or sending requests for paperwork completion to the office.

## 2022-03-19 ENCOUNTER — Other Ambulatory Visit: Payer: Self-pay | Admitting: Nurse Practitioner

## 2022-03-24 ENCOUNTER — Other Ambulatory Visit: Payer: Self-pay | Admitting: Nurse Practitioner

## 2022-03-24 DIAGNOSIS — G5601 Carpal tunnel syndrome, right upper limb: Secondary | ICD-10-CM

## 2022-03-24 DIAGNOSIS — M5416 Radiculopathy, lumbar region: Secondary | ICD-10-CM

## 2022-03-24 DIAGNOSIS — M65321 Trigger finger, right index finger: Secondary | ICD-10-CM

## 2022-03-24 DIAGNOSIS — M542 Cervicalgia: Secondary | ICD-10-CM

## 2022-03-27 ENCOUNTER — Ambulatory Visit (INDEPENDENT_AMBULATORY_CARE_PROVIDER_SITE_OTHER): Payer: 59

## 2022-03-27 ENCOUNTER — Encounter (HOSPITAL_BASED_OUTPATIENT_CLINIC_OR_DEPARTMENT_OTHER): Payer: Self-pay

## 2022-03-27 DIAGNOSIS — Z0000 Encounter for general adult medical examination without abnormal findings: Secondary | ICD-10-CM

## 2022-03-27 NOTE — Progress Notes (Signed)
Subjective:   Laura Burnett is a 56 y.o. female who presents for an Initial Medicare Annual Wellness Visit. I connected with  Laura Burnett on 03/27/22 by a video and audio enabled telemedicine application and verified that I am speaking with the correct person using two identifiers.  Patient Location: Home  Provider Location: Home Office  I discussed the limitations of evaluation and management by telemedicine. The patient expressed understanding and agreed to proceed.     Objective:    There were no vitals filed for this visit. There is no height or weight on file to calculate BMI.     04/30/2021   11:51 AM 03/12/2021    1:34 PM 01/08/2021    2:15 PM 12/24/2020   11:48 AM 12/02/2020    4:11 PM 10/11/2020   10:48 AM 07/16/2020    3:31 PM  Advanced Directives  Does Patient Have a Medical Advance Directive? No No No No No No No  Would patient like information on creating a medical advance directive? No - Patient declined No - Patient declined No - Patient declined No - Patient declined No - Patient declined No - Patient declined No - Patient declined    Current Medications (verified) Outpatient Encounter Medications as of 03/27/2022  Medication Sig   albuterol (VENTOLIN HFA) 108 (90 Base) MCG/ACT inhaler INHALE 2 PUFFS BY MOUTH EVERY 4 HOURS AS NEEDED FOR WHEEZING AND FOR SHORTNESS OF BREATH   atorvastatin (LIPITOR) 40 MG tablet Take 1 tablet (40 mg total) by mouth daily.   buPROPion (WELLBUTRIN XL) 150 MG 24 hr tablet Take 3 tablets (450 mg total) by mouth daily.   cyclobenzaprine (FLEXERIL) 10 MG tablet TAKE 1 TABLET BY MOUTH THREE TIMES DAILY AS NEEDED FOR  MUSCLE  SPASM.  WORK  TO  TAPER  TO  TWICE  A  DAY  MAX   docusate sodium (COLACE) 100 MG capsule Take 100 mg by mouth 2 (two) times daily.   DULoxetine (CYMBALTA) 60 MG capsule Take 2 capsules (120 mg total) by mouth at bedtime.   fluticasone (FLONASE) 50 MCG/ACT nasal spray Place 2 sprays into both nostrils daily.    gabapentin (NEURONTIN) 800 MG tablet TAKE 1 TABLET BY MOUTH THREE TIMES DAILY   Iron, Ferrous Sulfate, 325 (65 Fe) MG TABS Take 1 tablet by mouth with food for 3 days every week.   ketoconazole (NIZORAL) 2 % shampoo Apply 1 application topically 2 (two) times a week.   levocetirizine (XYZAL) 5 MG tablet Take 1 tablet (5 mg total) by mouth every evening.   levothyroxine (SYNTHROID) 150 MCG tablet Take 1 tablet (150 mcg total) by mouth daily.   montelukast (SINGULAIR) 10 MG tablet TAKE 1 TABLET BY MOUTH AT BEDTIME   mupirocin ointment (BACTROBAN) 2 % Apply to affected area TID for 7 days.   omeprazole (PRILOSEC) 40 MG capsule Take 1 capsule (40 mg total) by mouth 2 (two) times daily.   azelastine (ASTELIN) 0.1 % nasal spray Place 2 sprays into both nostrils 2 (two) times daily. Use in each nostril as directed (Patient not taking: Reported on 03/27/2022)   Facility-Administered Encounter Medications as of 03/27/2022  Medication   dexamethasone (DECADRON) injection 10 mg    Allergies (verified) Ciprofloxacin, Phentermine, Tape, Bactrim [sulfamethoxazole-trimethoprim], Percocet [oxycodone-acetaminophen], Tetanus toxoids, and Tylenol with codeine #3 [acetaminophen-codeine]   History: Past Medical History:  Diagnosis Date   Abdominal pain, acute, epigastric 07/21/2019   Abscess 01/03/2022   Allergy    seasonal allergies/animals  Anxiety    Arthritis    Asthma    as a child " exercise induced"   Bronchitis    Constipation    Depression    DJD (degenerative joint disease)    Encounter for medical examination to establish care 06/16/2021   Fibromyalgia    GERD (gastroesophageal reflux disease)    Headache    Hypothyroidism    Obesity    Perioral dermatitis 03/12/2021   PONV (postoperative nausea and vomiting)    Wears glasses    Past Surgical History:  Procedure Laterality Date   ABDOMINAL HYSTERECTOMY     ADENOIDECTOMY     APPLICATION OF ROBOTIC ASSISTANCE FOR SPINAL PROCEDURE N/A  12/21/2016   Procedure: APPLICATION OF ROBOTIC ASSISTANCE FOR SPINAL PROCEDURE;  Surgeon: Kevan Ny Ditty, MD;  Location: [facility];  Service: Neurosurgery;  Laterality: N/A;   BACK SURGERY     L5-S1   CARPAL TUNNEL RELEASE Right 01/16/2019   Procedure: RIGHT CARPAL TUNNEL RELEASE;  Surgeon: [name], MD;  Location: [facility];  Service: Orthopedics;  Laterality: Right;   COLONOSCOPY     CYST EXCISION     uterine ;subsequent sx followed   FOOT SURGERY     HAND SURGERY     from AA   KNEE ARTHROSCOPY     x2   TONSILLECTOMY     TRIGGER FINGER RELEASE Right 01/16/2019   Procedure: RIGHT TRIGGER THUMB RELEASE;  Surgeon: [name], MD;  Location: [facility];  Service: Orthopedics;  Laterality: Right;   TRIGGER FINGER RELEASE Right 12/04/2019   Procedure: right index trigger finger release;  Surgeon: [name], MD;  Location: [facility];  Service: Orthopedics;  Laterality: Right;   UPPER GASTROINTESTINAL ENDOSCOPY     Family History  Problem Relation Age of Onset   Alzheimer's disease Mother    Diabetes Mother    Heart attack Father    Colon cancer Father    Heart failure Father    Thyroid disease Sister    Thyroid disease Brother    Stomach cancer Brother    Cancer Brother    Cancer Brother    ADD / ADHD Other    Esophageal cancer Neg Hx    Rectal cancer Neg Hx    Social History   Socioeconomic History   Marital status: Divorced    Spouse name: Not on file   Number of children: 1   Years of education: 12   Highest education level: High school graduate  Occupational History   Occupation: Disabled   Tobacco Use   Smoking status: Former   Smokeless tobacco: Never   Tobacco comments:    stopped smoking cigarettes at age 89  Vaping Use   Vaping Use: Never used  Substance and Sexual Activity   Alcohol use: Not Currently   Drug use: No   Sexual activity: Not Currently  Other Topics Concern   Not on file  Social  History Narrative   Patient lives alone in [place], [place].   Patient has one son who lives nearby.    Patient has 2 grandchildren.    Patient enjoys sewing, being outside in her garden, and spending time with family.    Social Determinants of Health   Financial Resource Strain: Medium Risk (03/27/2022)   Overall Financial Resource Strain (CARDIA)    Difficulty of Paying Living Expenses: Somewhat hard  Food Insecurity: Food Insecurity Present (03/27/2022)   Hunger Vital Sign  Worried About Charity fundraiser in the Last Year: Sometimes true    [employer] of Food in the Last Year: Never true  Transportation Needs: No Transportation Needs (12/24/2020)   PRAPARE - Hydrologist (Medical): No    Lack of Transportation (Non-Medical): No  Physical Activity: Insufficiently Active (03/27/2022)   Exercise Vital Sign    Days of Exercise per Week: 1 day    Minutes of Exercise per Session: 20 min  Stress: No Stress Concern Present (03/27/2022)   [place]    Feeling of Stress : Only a little  Social Connections: Socially Isolated (03/27/2022)   Social Connection and Isolation Panel [NHANES]    Frequency of Communication with Friends and Family: More than three times a week    Frequency of Social Gatherings with Friends and Family: Twice a week    Attends Religious Services: Never    Marine scientist or Organizations: No    Attends Music therapist: Never    Marital Status: Never married    Tobacco Counseling Counseling given: Not Answered Tobacco comments: stopped smoking cigarettes at age 50   Clinical Intake:  Pre-visit preparation completed: Yes        Diabetes: Yes CBG done?: No Did pt. bring in CBG monitor from home?: No  How often do you need to have someone help you when you read instructions, pamphlets, or other written materials from your doctor or pharmacy?: 1 -  Never What is the last grade level you completed in school?: 12th grade  Diabetic?yes  Interpreter Needed?: No      Activities of Daily Living    03/27/2022    1:09 PM 03/25/2022    9:50 AM  In your present state of health, do you have any difficulty performing the following activities:  Hearing? 0 0  Vision? 0 0  Difficulty concentrating or making decisions? 0 0  Walking or climbing stairs? 0 1  Dressing or bathing? 0 0  Doing errands, shopping? 0 0  Using the Toilet?  N  In the past six months, have you accidently leaked urine?  N  Do you have problems with loss of bowel control?  N  Managing your Medications?  N  Managing your Finances?  N  Housekeeping or managing your Housekeeping?  Y    Patient Care Team: Early, Coralee Pesa, NP as PCP - General (Nurse Practitioner) Rigoberto Noel, MD as Consulting Physician (Pulmonary Disease) Tally Joe, MD as Referring Physician (Pediatrics) Norman Clay, MD as Consulting Physician (Psychiatry) [name], MD as Consulting Physician (Orthopedic Surgery) Jason Coop, DO as Consulting Physician (Otolaryngology) Renato Shin, MD (Inactive) as Consulting Physician (Endocrinology) Milus Banister, MD as Attending Physician (Gastroenterology)  Indicate any recent Medical Services you may have received from other than [facility] providers in the past year (date may be approximate).     Assessment:   This is a routine wellness examination for Telicia.  Hearing/Vision screen No results found.  Dietary issues and exercise activities discussed:     Goals Addressed   None   Depression Screen    03/27/2022    1:05 PM 03/18/2022    8:44 AM 12/25/2021   10:47 AM 10/27/2021    2:10 PM 09/22/2021    1:13 PM 06/16/2021    1:47 PM 05/14/2021    3:24 PM  PHQ 2/9 Scores  PHQ - 2 Score 0  1 0 1  2   PHQ- 9 Score 0 8    8   Exception Documentation  Medical reason Medical reason         Information is confidential and restricted.  Go to Review Flowsheets to unlock data.    Fall Risk    03/27/2022    1:09 PM 03/25/2022    9:50 AM 03/18/2022    8:44 AM 12/25/2021   10:46 AM 10/27/2021    2:10 PM  Fall Risk   Falls in the past year? 0 0 0 0 0  Number falls in past yr: 0 0 0 0 0  Injury with Fall? 0 0 0 0 0  Risk for fall due to : No Fall Risks  No Fall Risks No Fall Risks No Fall Risks  Follow up Falls evaluation completed  Falls evaluation completed;Education provided Falls evaluation completed;Education provided Falls evaluation completed    FALL RISK PREVENTION PERTAINING TO THE HOME:  Any stairs in or around the home? Yes  If so, are there any without handrails? No  Home free of loose throw rugs in walkways, pet beds, electrical cords, etc? Yes  Adequate lighting in your home to reduce risk of falls? Yes   ASSISTIVE DEVICES UTILIZED TO PREVENT FALLS:  Life alert? No  Use of a cane, walker or w/c? Yes  Grab bars in the bathroom? No  Shower chair or bench in shower? Yes  Elevated toilet seat or a handicapped toilet? No     Cognitive Function:        03/27/2022    1:11 PM 12/24/2020   11:50 AM  6CIT Screen  What Year? 0 points 0 points  What month?  0 points  What time? 0 points 0 points  Count back from 20 0 points 0 points  Months in reverse 0 points 0 points  Repeat phrase 0 points 0 points  Total Score  0 points    Immunizations Immunization History  Administered Date(s) Administered   Influenza,inj,Quad PF,6+ Mos 07/06/2018, 05/09/2019, 06/04/2020   Influenza-Unspecified 05/13/2021   Moderna Sars-Covid-2 Vaccination 11/23/2019, 12/21/2019, 07/08/2020    TDAP status: Due, Education has been provided regarding the importance of this vaccine. Advised may receive this vaccine at local pharmacy or Health Dept. Aware to provide a copy of the vaccination record if obtained from local pharmacy or Health Dept. Verbalized acceptance and understanding.  Flu Vaccine status: Due, Education has been  provided regarding the importance of this vaccine. Advised may receive this vaccine at local pharmacy or Health Dept. Aware to provide a copy of the vaccination record if obtained from local pharmacy or Health Dept. Verbalized acceptance and understanding.  Pneumococcal vaccine status: Due, Education has been provided regarding the importance of this vaccine. Advised may receive this vaccine at local pharmacy or Health Dept. Aware to provide a copy of the vaccination record if obtained from local pharmacy or Health Dept. Verbalized acceptance and understanding.  Covid-19 vaccine status: Completed vaccines  Qualifies for Shingles Vaccine? Yes   Zostavax completed No   Shingrix Completed?: No.    Education has been provided regarding the importance of this vaccine. Patient has been advised to call insurance company to determine out of pocket expense if they have not yet received this vaccine. Advised may also receive vaccine at local pharmacy or Health Dept. Verbalized acceptance and understanding.  Screening Tests Health Maintenance  Topic Date Due   TETANUS/TDAP  Never done   Zoster Vaccines- Shingrix (1 of  2) Never done   COVID-19 Vaccine (4 - Booster for Moderna series) 09/02/2020   INFLUENZA VACCINE  03/31/2022   MAMMOGRAM  09/20/2023   COLONOSCOPY (Pts 45-58yr Insurance coverage will need to be confirmed)  09/05/2024   HPV VACCINES  Aged Out   PAP SMEAR-Modifier  Discontinued   Hepatitis C Screening  Discontinued   HIV Screening  Discontinued    Health Maintenance  Health Maintenance Due  Topic Date Due   TETANUS/TDAP  Never done   Zoster Vaccines- Shingrix (1 of 2) Never done   COVID-19 Vaccine (4 - Booster for Moderna series) 09/02/2020    Colorectal cancer screening: Type of screening: Colonoscopy. Completed 09/06/2019. Repeat every 5 years  Mammogram status: Completed 2. Repeat every year  Bone Density status: Ordered provider notified. Pt provided with contact info and  advised to call to schedule appt.  Lung Cancer Screening: (Low Dose CT Chest recommended if Age 58-80years, 30 pack-year currently smoking OR have quit w/in 15years.) does qualify.   Lung Cancer Screening Referral: n/a  Additional Screening:  Hepatitis C Screening: does not qualify; Completed n/a states never on the chart.   Vision Screening: Recommended annual ophthalmology exams for early detection of glaucoma and other disorders of the eye. Is the patient up to date with their annual eye exam?  No  Who is the provider or what is the name of the office in which the patient attends annual eye exams? N/a If pt is not established with a provider, would they like to be referred to a provider to establish care? No .   Dental Screening: Recommended annual dental exams for proper oral hygiene  Community Resource Referral / Chronic Care Management: CRR required this visit?  No   CCM required this visit?  No      Plan:     I have personally reviewed and noted the following in the patient's chart:   Medical and social history Use of alcohol, tobacco or illicit drugs  Current medications and supplements including opioid prescriptions. Patient is not currently taking opioid prescriptions. Functional ability and status Nutritional status Physical activity Advanced directives List of other physicians Hospitalizations, surgeries, and ER visits in previous 12 months Vitals Screenings to include cognitive, depression, and falls Referrals and appointments  In addition, I have reviewed and discussed with patient certain preventive protocols, quality metrics, and best practice recommendations. A written personalized care plan for preventive services as well as general preventive health recommendations were provided to patient.     VDebbora Dus COregon  03/27/2022   Nurse Notes: provider notified.

## 2022-03-27 NOTE — Patient Instructions (Signed)

## 2022-04-01 ENCOUNTER — Telehealth: Payer: Self-pay

## 2022-04-01 NOTE — Telephone Encounter (Signed)
pt was given instruction per dr. Modesta Messing order. pt understood instrucations.

## 2022-04-01 NOTE — Telephone Encounter (Signed)
pt called left message that the wellbutrin '450mg'$  if she can decrease to '300mg'$  she is having headaches.

## 2022-04-01 NOTE — Telephone Encounter (Signed)
Could you advise her to take 300 mg daily until the next appointment to see if her headache resolve. Please advise her to be seen by PCP If her headache worsens despite change in the medication.

## 2022-04-16 ENCOUNTER — Ambulatory Visit (HOSPITAL_BASED_OUTPATIENT_CLINIC_OR_DEPARTMENT_OTHER): Payer: 59 | Admitting: Nurse Practitioner

## 2022-04-29 ENCOUNTER — Ambulatory Visit (INDEPENDENT_AMBULATORY_CARE_PROVIDER_SITE_OTHER): Payer: 59 | Admitting: Nurse Practitioner

## 2022-04-29 ENCOUNTER — Encounter (HOSPITAL_BASED_OUTPATIENT_CLINIC_OR_DEPARTMENT_OTHER): Payer: Self-pay | Admitting: Nurse Practitioner

## 2022-04-29 VITALS — BP 112/79 | HR 90 | Ht 61.0 in | Wt 191.0 lb

## 2022-04-29 DIAGNOSIS — R140 Abdominal distension (gaseous): Secondary | ICD-10-CM | POA: Insufficient documentation

## 2022-04-29 DIAGNOSIS — H66001 Acute suppurative otitis media without spontaneous rupture of ear drum, right ear: Secondary | ICD-10-CM

## 2022-04-29 DIAGNOSIS — R519 Headache, unspecified: Secondary | ICD-10-CM

## 2022-04-29 MED ORDER — AMOXICILLIN-POT CLAVULANATE 875-125 MG PO TABS: 1.0000 | ORAL_TABLET | Freq: Two times a day (BID) | ORAL | 0 refills | Status: DC

## 2022-04-29 MED ORDER — GABAPENTIN 800 MG PO TABS: 800.0000 mg | ORAL_TABLET | Freq: Three times a day (TID) | ORAL | 3 refills | Status: DC

## 2022-04-29 MED ORDER — VALACYCLOVIR HCL 1 G PO TABS: 1000.0000 mg | ORAL_TABLET | Freq: Two times a day (BID) | ORAL | 0 refills | Status: AC

## 2022-04-29 NOTE — Assessment & Plan Note (Signed)
Recent labs show low ferritin levels with normal hemoglobin.  At this time there are no signs of bleeding.  She is having some abdominal bloating however no epigastric tenderness or dark stools are present.  We will plan to start iron supplement in an effort to normalize her ferritin levels and monitor closely.

## 2022-04-29 NOTE — Assessment & Plan Note (Signed)
Symptoms and presentation consistent with enlargement of a lymph node on the right posterio lateral neck proximal to the ear.  There does appear to be some enlargement however the nodularity is rubbery, not fixed, nontender.  Reassurance provided today.  She does have a sinus CT scheduled for later today which she may capture an image of the area if needed.  I have discussed with the patient that if she feels this is getting larger or any new or worsening symptoms develop to let me know and we will plan to follow-up accordingly.  May consider ultrasound of the area if changes are noted.

## 2022-04-29 NOTE — Assessment & Plan Note (Signed)
Abdominal bloating of unknown etiology.  Based on patient's symptoms and presentation is very well could be due to chronic constipation.  There are no alarm symptoms present at this time.  Unfortunately it does seem that she has tried multiple medications for her constipation and these do not appear to be working for her any longer.  I did recommend a trial of magnesium citrate to see if this will help facilitate a bowel cleanout and close monitoring.  If symptoms continue recommend referral to GI for further evaluation.  Patient is in agreement and will follow-up if symptoms worsen or fail to improve.

## 2022-04-29 NOTE — Patient Instructions (Signed)
I have sent in medication to cover if the ear has an infection in it or an infection behind the ear.   I have also sent in medication to treat if there is shingles present given the pain and rash. I put information on this condition called [facility] Hunt.  Keep taking the gabapentin to help with the pain.

## 2022-04-29 NOTE — Progress Notes (Signed)
Orma Render, DNP, AGNP-c [facility] [address]  [place], [place] [address] [phone]  Subjective:   Laura Burnett is a 56 y.o. female presents to day for evaluation of: Acute Visit (Patient has gland pain. Stabbing pain in right ear since yesterday )  Facial Pain Valeta reports sharp pain radiating from behind the right ear and down the right side of her neck and along her right jaw line.  She reports the radiation stems from the area of enlarged lymph node behind the ear.  She tells me she feels this has gotten bigger.  The pain started yesterday morning She also endorses swelling on the right side of the face in her cheek, jaw line, and under her eye She does feel that she has had several new "places" show up on the right side of her face that were not there yesterday.  She has not had any fever or chills.  She feels that the salivary glands may also be slightly enlarged.  She has no infected teeth, but does feel the tenderness in her gums on the entire right side of her face.  She also feels that the right side of her face may be drooping slightly.   PMH, Medications, and Allergies reviewed and updated in chart as appropriate.   ROS negative except for what is listed in HPI. Objective:  BP 112/79   Pulse 90   Ht '5\' 1"'$  (1.549 m)   Wt 191 lb (86.6 kg)   SpO2 96%   BMI 36.09 kg/m  Physical Exam Vitals and nursing note reviewed.  Constitutional:      General: She is not in acute distress.    Appearance: She is not ill-appearing.  HENT:     Head: Atraumatic.     Jaw: Tenderness present. No swelling or pain on movement.     Salivary Glands: Right salivary gland is tender. Right salivary gland is not diffusely enlarged. Left salivary gland is not diffusely enlarged or tender.     Comments: Tenderness along the right neck, jaw line, and cheek with light touch. Erythematous round lesions present on the right cheek in a  linear fashion. No draining or pustules present.     Right Ear: Ear canal and external ear normal. Tenderness present. A middle ear effusion is present.     Left Ear:  No middle ear effusion.     Ears:     Comments: Tenderness near the mastoid process where enlarge lymph node is noted.     Nose: Nose normal.     Right Turbinates: Swollen.     Right Sinus: No maxillary sinus tenderness.     Mouth/Throat:     Mouth: Mucous membranes are moist.     Pharynx: Oropharynx is clear. No oropharyngeal exudate or posterior oropharyngeal erythema.  Eyes:     Extraocular Movements: Extraocular movements intact.     Conjunctiva/sclera: Conjunctivae normal.     Pupils: Pupils are equal, round, and reactive to light.  Neck:     Vascular: No carotid bruit.  Cardiovascular:     Rate and Rhythm: Normal rate and regular rhythm.     Pulses: Normal pulses.     Heart sounds: Normal heart sounds.  Pulmonary:     Effort: Pulmonary effort is normal.     Breath sounds: Normal breath sounds.  Abdominal:     General: Bowel sounds are normal.     Palpations: Abdomen is soft.  Musculoskeletal:        General: Normal range of motion.     Cervical back: Normal range of motion. Tenderness present.  Lymphadenopathy:     Cervical: Cervical adenopathy present.  Skin:    General: Skin is warm and dry.     Capillary Refill: Capillary refill takes less than 2 seconds.  Neurological:     General: No focal deficit present.     Mental Status: She is alert.  Psychiatric:        Mood and Affect: Mood normal.        Behavior: Behavior normal.        Thought Content: Thought content normal.        Judgment: Judgment normal.           Assessment & Plan:   1. Right-sided face pain Face pain on the right side with erythematous vessicular lesions appearing in a linear pattern.Suspect that these may be zoster-type infection/response given the associated symptoms. Will send treatment with gabapentin for pain control  and valacyclovir for suspected zoster infection. If no improvement, recommend follow-up.  - gabapentin (NEURONTIN) 800 MG tablet; Take 1 tablet (800 mg total) by mouth 3 (three) times daily.  Dispense: 270 tablet; Refill: 3 - valACYclovir (VALTREX) 1000 MG tablet; Take 1 tablet (1,000 mg total) by mouth 2 (two) times daily for 10 days.  Dispense: 20 tablet; Refill: 0  2. Non-recurrent acute suppurative otitis media of right ear without spontaneous rupture of tympanic membrane Right sided purulent effusion present with tenderness and pain. Will send treatment with antibiotic therapy and monitor closely.  - amoxicillin-clavulanate (AUGMENTIN) 875-125 MG tablet; Take 1 tablet by mouth 2 (two) times daily.  Dispense: 20 tablet; Refill: 0    Orma Render, DNP, AGNP-c 05/24/2022  9:37 PM    History, Medications, Surgery, SDOH, and Family History reviewed and updated as appropriate.

## 2022-04-30 ENCOUNTER — Encounter (HOSPITAL_BASED_OUTPATIENT_CLINIC_OR_DEPARTMENT_OTHER): Payer: Self-pay | Admitting: Nurse Practitioner

## 2022-04-30 ENCOUNTER — Ambulatory Visit (HOSPITAL_BASED_OUTPATIENT_CLINIC_OR_DEPARTMENT_OTHER): Payer: 59 | Admitting: Nurse Practitioner

## 2022-04-30 DIAGNOSIS — R519 Headache, unspecified: Secondary | ICD-10-CM | POA: Insufficient documentation

## 2022-04-30 DIAGNOSIS — H66001 Acute suppurative otitis media without spontaneous rupture of ear drum, right ear: Secondary | ICD-10-CM | POA: Insufficient documentation

## 2022-04-30 NOTE — Assessment & Plan Note (Signed)
Right sided face and neck tenderness with episodes of sharp, shooting pain stemming from the right posterior occipital region behind the right ear and radiating into the neck and jaw on the right side. There are small erythematous macular lesions present on the right side of the face leading from the ear towards the midline. Unclear if these are related to chronic lesions. She does have significant tenderness with light touch. There is mild edema noted under the eye. The face appears fairly symmetrical, however, the edema does give the appearance of a difference.  At this time, I do feel it is reasonable to begin treatment with valacyclovir as her symptoms do align with possible shingles outbreak. She has been vaccinated.

## 2022-05-07 ENCOUNTER — Ambulatory Visit (INDEPENDENT_AMBULATORY_CARE_PROVIDER_SITE_OTHER): Payer: 59

## 2022-05-07 ENCOUNTER — Ambulatory Visit (INDEPENDENT_AMBULATORY_CARE_PROVIDER_SITE_OTHER): Payer: 59 | Admitting: Podiatry

## 2022-05-07 DIAGNOSIS — M7671 Peroneal tendinitis, right leg: Secondary | ICD-10-CM

## 2022-05-07 DIAGNOSIS — Z9889 Other specified postprocedural states: Secondary | ICD-10-CM

## 2022-05-07 MED ORDER — MELOXICAM 15 MG PO TABS: 15.0000 mg | ORAL_TABLET | Freq: Every day | ORAL | 2 refills | Status: DC

## 2022-05-07 MED ORDER — TRIAMCINOLONE ACETONIDE 10 MG/ML IJ SUSP
10.0000 mg | Freq: Once | INTRAMUSCULAR | Status: AC
  Administered 2022-05-07: 10 mg

## 2022-05-08 NOTE — Progress Notes (Signed)
Subjective:   Patient ID: Laura Burnett, female   DOB: 56 y.o.   MRN: [id number]   HPI Patient states overall she has been doing well but she has been concerned about some pain over the last couple weeks and just wants to make sure everything is okay   ROS      Objective:  Physical Exam  Neurovascular status intact with patient's right first MPJ having excellent range of motion mild swelling on the medial side and into the forefoot with history of trauma of the low-grade nature recently     Assessment:  Probability for low-grade trauma creating inflammation no other issues noted     Plan:  Reviewed condition and x-ray recommended ice therapy and good shoe gear support should be uneventful  X-rays indicate no sign of fracture fixation in place joint congruence and looks healthy from previous surgery
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
## 2022-05-13 ENCOUNTER — Encounter: Payer: Self-pay | Admitting: Psychiatry

## 2022-05-13 ENCOUNTER — Telehealth (INDEPENDENT_AMBULATORY_CARE_PROVIDER_SITE_OTHER): Payer: 59 | Admitting: Psychiatry

## 2022-05-13 DIAGNOSIS — F3341 Major depressive disorder, recurrent, in partial remission: Secondary | ICD-10-CM

## 2022-05-13 DIAGNOSIS — F451 Undifferentiated somatoform disorder: Secondary | ICD-10-CM

## 2022-05-13 DIAGNOSIS — F411 Generalized anxiety disorder: Secondary | ICD-10-CM

## 2022-05-13 MED ORDER — BUPROPION HCL ER (XL) 150 MG PO TB24: 450.0000 mg | ORAL_TABLET | Freq: Every day | ORAL | 0 refills | Status: DC

## 2022-05-13 NOTE — Patient Instructions (Signed)
Continue duloxetine 120 mg at night  Continue bupropion 450 mg daily  Next appointment: 11/22 at 9 AM

## 2022-05-21 ENCOUNTER — Other Ambulatory Visit (HOSPITAL_BASED_OUTPATIENT_CLINIC_OR_DEPARTMENT_OTHER): Payer: Self-pay

## 2022-05-21 DIAGNOSIS — Z8619 Personal history of other infectious and parasitic diseases: Secondary | ICD-10-CM

## 2022-05-21 MED ORDER — VALACYCLOVIR HCL 1 G PO TABS: 1000.0000 mg | ORAL_TABLET | Freq: Two times a day (BID) | ORAL | 3 refills | Status: AC

## 2022-06-21 ENCOUNTER — Other Ambulatory Visit: Payer: Self-pay | Admitting: Nurse Practitioner

## 2022-06-21 DIAGNOSIS — G5601 Carpal tunnel syndrome, right upper limb: Secondary | ICD-10-CM

## 2022-06-21 DIAGNOSIS — M542 Cervicalgia: Secondary | ICD-10-CM

## 2022-06-21 DIAGNOSIS — I2510 Atherosclerotic heart disease of native coronary artery without angina pectoris: Secondary | ICD-10-CM

## 2022-06-21 DIAGNOSIS — E782 Mixed hyperlipidemia: Secondary | ICD-10-CM

## 2022-06-21 DIAGNOSIS — M65321 Trigger finger, right index finger: Secondary | ICD-10-CM

## 2022-06-21 DIAGNOSIS — M5416 Radiculopathy, lumbar region: Secondary | ICD-10-CM

## 2022-07-13 ENCOUNTER — Telehealth (INDEPENDENT_AMBULATORY_CARE_PROVIDER_SITE_OTHER): Payer: 59 | Admitting: Nurse Practitioner

## 2022-07-13 ENCOUNTER — Encounter (HOSPITAL_BASED_OUTPATIENT_CLINIC_OR_DEPARTMENT_OTHER): Payer: Self-pay | Admitting: Nurse Practitioner

## 2022-07-13 DIAGNOSIS — S6992XA Unspecified injury of left wrist, hand and finger(s), initial encounter: Secondary | ICD-10-CM | POA: Diagnosis not present

## 2022-07-13 NOTE — Patient Instructions (Addendum)
My new office will be [place]  [address] [place], [place]  You can call to set up an appointment at your convenience.   I have sent the order for the x-ray to [facility]. They will call you to schedule this.

## 2022-07-13 NOTE — Assessment & Plan Note (Signed)
Swelling and ecchymosis present with tenderness and decreased ROM following fall onto index finger on left hand. Concern for possible fracture in metacarpal of the index finger or tendon injury. Will order x-ray of hand and determine next steps based on results.

## 2022-07-13 NOTE — Progress Notes (Signed)
Virtual Visit Encounter mychart visit.   I connected with  Laura Burnett on 07/13/22 at  2:10 PM EST by secure video and audio telemedicine application. I verified that I am speaking with the correct person using two identifiers.   I introduced myself as a Designer, jewellery with the practice. The limitations of evaluation and management by telemedicine discussed with the patient and the availability of in person appointments. The patient expressed verbal understanding and consent to proceed.  Participating parties in this visit include: Myself and patient  The patient is: Patient Location: Home I am: Provider Location: Office/Clinic Subjective:    CC and HPI: Laura Burnett is a 56 y.o. year old female presenting for new evaluation and treatment of hand pain. Patient reports the following:  Laura Burnett tells me about 5 weeks ago she hit her index finger on her left hand on the edge of a dressers while catching herself from tripping. She endorses the finger bending backward at the pip joint at the time of injury. She tells me that the pain was significant, but she thought that it would heal on it's own. She endorses swelling and tenderness at the knuckle and significant pain if she hits the end of the index finger or if she bends the finger backward. She is able to grip with little pain.   Past medical history, Surgical history, Family history not pertinant except as noted below, Social history, Allergies, and medications have been entered into the medical record, reviewed, and corrections made.   Review of Systems:  All review of systems negative except what is listed in the HPI  Objective:    Alert and oriented x 4 Speaking in clear sentences with no shortness of breath. No distress. Left hand index finger shows edema and ecchymosis at the PIP and into the dorsal surface of the hand.   Impression and Recommendations:    Problem List Items Addressed This Visit     Injury of finger of left  hand - Primary    Swelling and ecchymosis present with tenderness and decreased ROM following fall onto index finger on left hand. Concern for possible fracture in metacarpal of the index finger or tendon injury. Will order x-ray of hand and determine next steps based on results.       Relevant Orders   DG Hand Complete Left    orders and follow up as documented in EMR I discussed the assessment and treatment plan with the patient. The patient was provided an opportunity to ask questions and all were answered. The patient agreed with the plan and demonstrated an understanding of the instructions.   The patient was advised to call back or seek an in-person evaluation if the symptoms worsen or if the condition fails to improve as anticipated.  Follow-Up: after labs and/or imaging, consults, etc. have been completed  I provided 24 minutes of non-face-to-face interaction with this non face-to-face encounter including intake, same-day documentation, and chart review.   Orma Render, NP , DNP, AGNP-c [place] [facility] [phone] (fax)

## 2022-07-14 ENCOUNTER — Ambulatory Visit (HOSPITAL_COMMUNITY)
Admission: RE | Admit: 2022-07-14 | Discharge: 2022-07-14 | Disposition: A | Discharge status: Home / Self Care | Payer: 59 | Source: Ambulatory Visit | Attending: Nurse Practitioner | Admitting: Nurse Practitioner

## 2022-07-14 DIAGNOSIS — S6992XA Unspecified injury of left wrist, hand and finger(s), initial encounter: Secondary | ICD-10-CM | POA: Diagnosis not present
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
## 2022-07-22 ENCOUNTER — Telehealth (INDEPENDENT_AMBULATORY_CARE_PROVIDER_SITE_OTHER): Payer: 59 | Admitting: Psychiatry

## 2022-07-22 ENCOUNTER — Encounter: Payer: Self-pay | Admitting: Psychiatry

## 2022-07-22 DIAGNOSIS — F3341 Major depressive disorder, recurrent, in partial remission: Secondary | ICD-10-CM | POA: Diagnosis not present

## 2022-07-22 DIAGNOSIS — F411 Generalized anxiety disorder: Secondary | ICD-10-CM | POA: Diagnosis not present

## 2022-07-22 NOTE — Patient Instructions (Signed)
Continue duloxetine 120 mg at night  Continue bupropion 450 mg daily  Next appointment: 2/14 at 9 AM

## 2022-08-03 ENCOUNTER — Ambulatory Visit (INDEPENDENT_AMBULATORY_CARE_PROVIDER_SITE_OTHER): Payer: 59 | Admitting: Nurse Practitioner

## 2022-08-03 ENCOUNTER — Encounter: Payer: Self-pay | Admitting: Nurse Practitioner

## 2022-08-03 VITALS — BP 124/72 | HR 110 | Temp 98.2°F | Wt 187.8 lb

## 2022-08-03 DIAGNOSIS — M25542 Pain in joints of left hand: Secondary | ICD-10-CM | POA: Diagnosis not present

## 2022-08-03 DIAGNOSIS — R790 Abnormal level of blood mineral: Secondary | ICD-10-CM

## 2022-08-03 DIAGNOSIS — E039 Hypothyroidism, unspecified: Secondary | ICD-10-CM | POA: Diagnosis not present

## 2022-08-03 DIAGNOSIS — J0140 Acute pansinusitis, unspecified: Secondary | ICD-10-CM

## 2022-08-03 MED ORDER — FLUCONAZOLE 150 MG PO TABS: ORAL_TABLET | ORAL | 2 refills | Status: DC

## 2022-08-03 MED ORDER — AMOXICILLIN-POT CLAVULANATE 875-125 MG PO TABS: 1.0000 | ORAL_TABLET | Freq: Two times a day (BID) | ORAL | 0 refills | Status: DC

## 2022-08-03 NOTE — Patient Instructions (Addendum)
I have sent the referral to the orthopedic in [place] for you. They will call you to schedule.   I have sent in the antibiotics for you to help with your sinuses and cough. You should feel better in a few days.

## 2022-08-03 NOTE — Progress Notes (Signed)
Orma Render, DNP, AGNP-c [place],  [id number] [phone]  Subjective:   Laura Burnett is a 56 y.o. female presents to day for evaluation of: F/u on left hand. Still tender, hurts with grabbing and grasping items, originally injured when carrying her grand daughter about six weeks ago. Has been using conservative measures and keeping stable.   Sinus Has some sinus drainage for 1 week, slight ST, morning it is worse, yellow and green mucous, not taking anything. No fevers, chills, BA.    PMH, Medications, and Allergies reviewed and updated in chart as appropriate.   ROS negative except for what is listed in HPI. Objective:  BP 124/72   Pulse (!) 110   Temp 98.2 F (36.8 C)   Wt 187 lb 12.8 oz (85.2 kg)   BMI 35.48 kg/m  Physical Exam Vitals reviewed.  Constitutional:      Appearance: Normal appearance.  HENT:     Head: Normocephalic.     Nose: Congestion and rhinorrhea present.     Right Sinus: Maxillary sinus tenderness and frontal sinus tenderness present.     Left Sinus: Maxillary sinus tenderness and frontal sinus tenderness present.     Mouth/Throat:     Pharynx: Posterior oropharyngeal erythema present.  Eyes:     Extraocular Movements: Extraocular movements intact.     Pupils: Pupils are equal, round, and reactive to light.  Cardiovascular:     Rate and Rhythm: Normal rate and regular rhythm.     Pulses: Normal pulses.     Heart sounds: Normal heart sounds.  Pulmonary:     Effort: Pulmonary effort is normal.     Breath sounds: Normal breath sounds.  Musculoskeletal:        General: Tenderness and signs of injury present.     Comments: Left hand tender with decreased ROM present. Mild edema noted.   Skin:    General: Skin is warm and dry.     Capillary Refill: Capillary refill takes less than 2 seconds.  Neurological:     General: No focal deficit present.     Mental Status: She is alert.  Psychiatric:         Mood and Affect: Mood normal.        Behavior: Behavior normal.           Assessment & Plan:   Problem List Items Addressed This Visit     Hypothyroidism    Repeat labs today. No other concerns at this time. No med changes.       Relevant Orders   T4, free (Completed)   TSH (Completed)   Acute non-recurrent pansinusitis    Symptoms and presentation consistent with sinusitis. Will send treatment with augmentin and diflucan in event yeast infection occurs. No alarm sx at this time. Recommend f/u if all symptoms are not cleared in the next 7-10 days.       Relevant Orders   CBC with Differential/Platelet (Completed)   Low ferritin    Repeat labs today. No symptoms at this time.      Relevant Orders   CBC with Differential/Platelet (Completed)   Iron, TIBC and Ferritin Panel (Completed)   Pain, joint, hand, left - Primary    Left hand pain x 6 weeks. We have not had success with ice, stretching, and immobilization therefore recommend referral to orthopedics for further evaluation. Will request hand specialist. Recommend continue with current treatment until seen. F/U if new or worsening symptoms  present.       Relevant Orders   Ambulatory referral to Orthopedic Surgery      Orma Render, DNP, AGNP-c 08/25/2022  4:31 PM    History, Medications, Surgery, SDOH, and Family History reviewed and updated as appropriate.

## 2022-08-04 LAB — CBC WITH DIFFERENTIAL/PLATELET
Basophils Absolute: 0 10*3/uL (ref 0.0–0.2)
Basos: 0 %
EOS (ABSOLUTE): 0.1 10*3/uL (ref 0.0–0.4)
Eos: 2 %
Hematocrit: 42.1 % (ref 34.0–46.6)
Hemoglobin: 14.8 g/dL (ref 11.1–15.9)
Immature Grans (Abs): 0 10*3/uL (ref 0.0–0.1)
Immature Granulocytes: 0 %
Lymphocytes Absolute: 1.4 10*3/uL (ref 0.7–3.1)
Lymphs: 24 %
MCH: 30.9 pg (ref 26.6–33.0)
MCHC: 35.2 g/dL (ref 31.5–35.7)
MCV: 88 fL (ref 79–97)
Monocytes Absolute: 0.5 10*3/uL (ref 0.1–0.9)
Monocytes: 9 %
Neutrophils Absolute: 3.7 10*3/uL (ref 1.4–7.0)
Neutrophils: 65 %
Platelets: 335 10*3/uL (ref 150–450)
RBC: 4.79 x10E6/uL (ref 3.77–5.28)
RDW: 12.6 % (ref 11.7–15.4)
WBC: 5.7 10*3/uL (ref 3.4–10.8)

## 2022-08-04 LAB — IRON,TIBC AND FERRITIN PANEL
Ferritin: 33 ng/mL (ref 15–150)
Iron Saturation: 33 % (ref 15–55)
Iron: 111 ug/dL (ref 27–159)
Total Iron Binding Capacity: 335 ug/dL (ref 250–450)
UIBC: 224 ug/dL (ref 131–425)

## 2022-08-04 LAB — T4, FREE: Free T4: 1.55 ng/dL (ref 0.82–1.77)

## 2022-08-04 LAB — TSH: TSH: 0.043 u[IU]/mL — ABNORMAL LOW (ref 0.450–4.500)

## 2022-08-11 ENCOUNTER — Ambulatory Visit (INDEPENDENT_AMBULATORY_CARE_PROVIDER_SITE_OTHER): Payer: 59 | Admitting: Orthopaedic Surgery

## 2022-08-11 ENCOUNTER — Other Ambulatory Visit: Payer: Self-pay

## 2022-08-11 ENCOUNTER — Encounter: Payer: Self-pay | Admitting: Orthopaedic Surgery

## 2022-08-11 VITALS — BP 104/72 | HR 68 | Ht 61.0 in | Wt 185.0 lb

## 2022-08-11 DIAGNOSIS — M79642 Pain in left hand: Secondary | ICD-10-CM | POA: Diagnosis not present

## 2022-08-11 DIAGNOSIS — E039 Hypothyroidism, unspecified: Secondary | ICD-10-CM

## 2022-08-11 MED ORDER — PREDNISONE 5 MG (21) PO TBPK: ORAL_TABLET | ORAL | 0 refills | Status: DC

## 2022-08-11 NOTE — Progress Notes (Signed)
Subjective:    Patient ID: Laura Burnett, female    DOB: [birth year], 56 y.o.   MRN: [id number]  HPI She has pain of the left nondominant index finger near the MCP joint area for over a few months or so.  She was seen by her primary care for this.  X-rays were done 07-15-22.  X-rays were negative.  She has tried ice, heat, rubs and Advil with no help.  She has no trauma, no redness, no triggering.   Review of Systems  Constitutional:  Positive for activity change.  Musculoskeletal:  Positive for arthralgias and myalgias.  All other systems reviewed and are negative. For Review of Systems, all other systems reviewed and are negative.  The following is a summary of the past history medically, past history surgically, known current medicines, social history and family history.  This information is gathered electronically by the computer from prior information and documentation.  I review this each visit and have found including this information at this point in the chart is beneficial and informative.   Past Medical History:  Diagnosis Date   Abdominal pain, acute, epigastric 07/21/2019   Abscess 01/03/2022   Allergy    seasonal allergies/animals   Anxiety    Arthritis    Asthma    as a child " exercise induced"   Bronchitis    Constipation    Depression    DJD (degenerative joint disease)    Encounter for medical examination to establish care 06/16/2021   Fibromyalgia    GERD (gastroesophageal reflux disease)    Headache    Hypothyroidism    Obesity    Perioral dermatitis 03/12/2021   PONV (postoperative nausea and vomiting)    Wears glasses     Past Surgical History:  Procedure Laterality Date   ABDOMINAL HYSTERECTOMY     ADENOIDECTOMY     APPLICATION OF ROBOTIC ASSISTANCE FOR SPINAL PROCEDURE N/A 12/21/2016   Procedure: APPLICATION OF ROBOTIC ASSISTANCE FOR SPINAL PROCEDURE;  Surgeon: Kevan Ny Ditty, MD;  Location: [facility];  Service: Neurosurgery;  Laterality: N/A;    BACK SURGERY     L5-S1   CARPAL TUNNEL RELEASE Right 01/16/2019   Procedure: RIGHT CARPAL TUNNEL RELEASE;  Surgeon: [name], MD;  Location: [facility];  Service: Orthopedics;  Laterality: Right;   COLONOSCOPY     CYST EXCISION     uterine ;subsequent sx followed   FOOT SURGERY     HAND SURGERY     from AA   KNEE ARTHROSCOPY     x2   TONSILLECTOMY     TRIGGER FINGER RELEASE Right 01/16/2019   Procedure: RIGHT TRIGGER THUMB RELEASE;  Surgeon: [name], MD;  Location: [facility];  Service: Orthopedics;  Laterality: Right;   TRIGGER FINGER RELEASE Right 12/04/2019   Procedure: right index trigger finger release;  Surgeon: [name], MD;  Location: [facility];  Service: Orthopedics;  Laterality: Right;   UPPER GASTROINTESTINAL ENDOSCOPY      Current Outpatient Medications on File Prior to Visit  Medication Sig Dispense Refill   albuterol (VENTOLIN HFA) 108 (90 Base) MCG/ACT inhaler INHALE 2 PUFFS BY MOUTH EVERY 4 HOURS AS NEEDED FOR WHEEZING AND FOR SHORTNESS OF BREATH 18 g 0   atorvastatin (LIPITOR) 40 MG tablet Take 1 tablet by mouth once daily 90 tablet 0   buPROPion (WELLBUTRIN XL) 150 MG 24 hr tablet Take 3 tablets (450 mg total) by mouth daily.  270 tablet 0   cyclobenzaprine (FLEXERIL) 10 MG tablet TAKE 1 TABLET BY MOUTH THREE TIMES DAILY AS NEEDED FOR  MUSCLE  SPASM.  WORK  TO  TAPER  TO  TWICE  A  DAY  MAX 270 tablet 0   DULoxetine (CYMBALTA) 60 MG capsule Take 2 capsules (120 mg total) by mouth at bedtime. 180 capsule 1   gabapentin (NEURONTIN) 800 MG tablet Take 1 tablet (800 mg total) by mouth 3 (three) times daily. 270 tablet 3   IBUPROFEN PO Take by mouth.     Iron, Ferrous Sulfate, 325 (65 Fe) MG TABS Take 1 tablet by mouth with food for 3 days every week. 48 tablet 1   ketoconazole (NIZORAL) 2 % shampoo Apply 1 application topically 2 (two) times a week. 120 mL 0   levothyroxine (SYNTHROID) 150 MCG tablet Take 1 tablet  (150 mcg total) by mouth daily. 90 tablet 3   omeprazole (PRILOSEC) 40 MG capsule Take 1 capsule (40 mg total) by mouth 2 (two) times daily. 180 capsule 3   amoxicillin-clavulanate (AUGMENTIN) 875-125 MG tablet Take 1 tablet by mouth 2 (two) times daily. (Patient not taking: Reported on 08/11/2022) 10 tablet 0   fluconazole (DIFLUCAN) 150 MG tablet Take one tablet by mouth at the first sign of symptoms of yeast. If no resolution, repeat dose in 72 hours. (Patient not taking: Reported on 08/11/2022) 2 tablet 2   levocetirizine (XYZAL) 5 MG tablet Take 1 tablet (5 mg total) by mouth every evening. 30 tablet 2   meloxicam (MOBIC) 15 MG tablet Take 1 tablet (15 mg total) by mouth daily. 30 tablet 2   No current facility-administered medications on file prior to visit.    Social History   Socioeconomic History   Marital status: Divorced    Spouse name: Not on file   Number of children: 1   Years of education: 12   Highest education level: High school graduate  Occupational History   Occupation: Disabled   Tobacco Use   Smoking status: Former   Smokeless tobacco: Never   Tobacco comments:    stopped smoking cigarettes at age 77  Vaping Use   Vaping Use: Never used  Substance and Sexual Activity   Alcohol use: Not Currently   Drug use: No   Sexual activity: Not Currently  Other Topics Concern   Not on file  Social History Narrative   Patient lives alone in [place], [place].   Patient has one son who lives nearby.    Patient has 2 grandchildren.    Patient enjoys sewing, being outside in her garden, and spending time with family.    Social Determinants of Health   Financial Resource Strain: Medium Risk (03/27/2022)   Overall Financial Resource Strain (CARDIA)    Difficulty of Paying Living Expenses: Somewhat hard  Food Insecurity: Food Insecurity Present (03/27/2022)   Hunger Vital Sign    Worried About Running Out of Food in the Last Year: Sometimes true    Ran Out of Food in the Last  Year: Never true  Transportation Needs: No Transportation Needs (12/24/2020)   PRAPARE - Hydrologist (Medical): No    Lack of Transportation (Non-Medical): No  Physical Activity: Insufficiently Active (03/27/2022)   Exercise Vital Sign    Days of Exercise per Week: 1 day    Minutes of Exercise per Session: 20 min  Stress: No Stress Concern Present (03/27/2022)   Gas -  Occupational Stress Questionnaire    Feeling of Stress : Only a little  Social Connections: Socially Isolated (03/27/2022)   Social Connection and Isolation Panel [NHANES]    Frequency of Communication with Friends and Family: More than three times a week    Frequency of Social Gatherings with Friends and Family: Twice a week    Attends Religious Services: Never    Marine scientist or Organizations: No    Attends Archivist Meetings: Never    Marital Status: Never married  Intimate Partner Violence: Not At Risk (03/27/2022)   Humiliation, Afraid, Rape, and Kick questionnaire    Fear of Current or Ex-Partner: No    Emotionally Abused: No    Physically Abused: No    Sexually Abused: No    Family History  Problem Relation Age of Onset   Alzheimer's disease Mother    Diabetes Mother    Heart attack Father    Colon cancer Father    Heart failure Father    Thyroid disease Sister    Thyroid disease Brother    Stomach cancer Brother    Cancer Brother    Cancer Brother    ADD / ADHD Other    Esophageal cancer Neg Hx    Rectal cancer Neg Hx     BP 104/72   Pulse 68   Ht '5\' 1"'$  (1.549 m)   Wt 185 lb (83.9 kg)   BMI 34.96 kg/m   Body mass index is 34.96 kg/m.      Objective:   Physical Exam Vitals and nursing note reviewed. Exam conducted with a chaperone present.  Constitutional:      Appearance: She is well-developed.  HENT:     Head: Normocephalic and atraumatic.  Eyes:     Conjunctiva/sclera: Conjunctivae normal.      Pupils: Pupils are equal, round, and reactive to light.  Cardiovascular:     Rate and Rhythm: Normal rate and regular rhythm.  Pulmonary:     Effort: Pulmonary effort is normal.  Abdominal:     Palpations: Abdomen is soft.  Musculoskeletal:       Hands:     Cervical back: Normal range of motion and neck supple.  Skin:    General: Skin is warm and dry.  Neurological:     Mental Status: She is alert and oriented to person, place, and time.     Cranial Nerves: No cranial nerve deficit.     Motor: No abnormal muscle tone.     Coordination: Coordination normal.     Deep Tendon Reflexes: Reflexes are normal and symmetric. Reflexes normal.  Psychiatric:        Behavior: Behavior normal.        Thought Content: Thought content normal.        Judgment: Judgment normal.   I have independently reviewed and interpreted x-rays of this patient done at another site by another physician or qualified health professional.         Assessment & Plan:   Encounter Diagnosis  Name Primary?   Left hand pain Yes   I will begin prednisone dose pack.  Use Aspercreme in the area tid.  Return in one week.  Call if any problem.  Precautions discussed.  Electronically Signed Sanjuana Kava, MD 12/12/202310:05 AM

## 2022-08-11 NOTE — Patient Instructions (Signed)
 Dr.Keeling is here all day on Tuesdays, Wednesday mornings, and Thursday mornings. If you need anything such as a medication refill, please either call BEFORE the end of the day on [facility] or send a message through [facility]. Your pharmacy can send a refill request for you. Calling by the end of the day on [facility] allows [place] time to send Dr.Keeling the request and for him to respond before he leaves on Thursdays.  If Dr. Luna Glasgow is out of the office, we may send it to one of the other providers and they may not refill it for the same amount that your original prescription is for.   My name is  and I assist [place]. If you need anything before your next appointment, please do not hesitate to call the office at [phone] and ask to leave a message for me. I will respond within 24-48 business hours.   Aspercreme, Biofreeze, Blue Emu or Voltaren Gel over the counter 2-3 times daily. Rub into area well each use for best results.

## 2022-08-18 ENCOUNTER — Encounter: Payer: Self-pay | Admitting: Orthopaedic Surgery

## 2022-08-18 ENCOUNTER — Ambulatory Visit (INDEPENDENT_AMBULATORY_CARE_PROVIDER_SITE_OTHER): Payer: 59 | Admitting: Orthopaedic Surgery

## 2022-08-18 ENCOUNTER — Other Ambulatory Visit: Payer: Self-pay | Admitting: Psychiatry

## 2022-08-18 VITALS — BP 104/72 | Ht 61.0 in | Wt 185.0 lb

## 2022-08-18 DIAGNOSIS — M79642 Pain in left hand: Secondary | ICD-10-CM | POA: Diagnosis not present

## 2022-08-18 DIAGNOSIS — F411 Generalized anxiety disorder: Secondary | ICD-10-CM

## 2022-08-18 MED ORDER — HYDROCODONE-ACETAMINOPHEN 5-325 MG PO TABS: ORAL_TABLET | ORAL | 0 refills | Status: DC

## 2022-08-18 NOTE — Progress Notes (Signed)
I am no better.  She continues to have pain in the left hand in the area between the index MCP joint and the long finger MCP joint in the space between them and swelling.  She has no redness.  The prednisone dose pack did not help.  She has swelling on the palmar side of the left hand between metatarsal heads of second and third.  ROM is full.  NV intact.  Encounter Diagnosis  Name Primary?   Left hand pain Yes   I will call in pain medicine.  I will have hand surgeon see her.  I have reviewed the [facility] web site prior to prescribing narcotic medicine for this patient.  Call if any problem.  Precautions discussed.  Electronically Signed Sanjuana Kava, MD 12/19/20239:38 AM

## 2022-08-18 NOTE — Patient Instructions (Signed)
 As the weather changes and gets cooler, you may notice you are affected more. You may have more pain in your joints. This is normal. Dress warmly and make sure that area is covered well.   Aspercreme, Biofreeze, Blue Emu or Voltaren Gel over the counter 2-3 times daily. Rub into area well each use for best results.  Dr.Keeling is here all day on Tuesdays. He is here half a day on Wednesday mornings, and Thursday mornings. If you need anything such as a medication refill, please either call BEFORE the end of the day on [facility] or send a message through [place]. Your pharmacy can send a refill request for you. Calling by the end of the day on [facility] allows [place] time to send Dr.Keeling the request and for him to respond before he leaves on Thursdays.  My name is  and I assist [place]. If you need anything before your next appointment, please do not hesitate to call the office at [phone] and ask to leave a message for me. I will respond within 24-48 business hours.   You have been referred to see a hand specialist, Dr.Kevin Kuzma. We will send your referral to [facility]. If you have not heard from them in one week, call them to schedule your appointment.   Dr. Fredna Dow [facility] [address] [place],  [address] [facility]: [phone]

## 2022-08-22 ENCOUNTER — Other Ambulatory Visit: Payer: Self-pay | Admitting: Psychiatry

## 2022-08-22 DIAGNOSIS — F411 Generalized anxiety disorder: Secondary | ICD-10-CM

## 2022-08-25 DIAGNOSIS — M25542 Pain in joints of left hand: Secondary | ICD-10-CM | POA: Insufficient documentation

## 2022-08-25 NOTE — Assessment & Plan Note (Signed)
Repeat labs today. No symptoms at this time.

## 2022-08-25 NOTE — Assessment & Plan Note (Signed)
Symptoms and presentation consistent with sinusitis. Will send treatment with augmentin and diflucan in event yeast infection occurs. No alarm sx at this time. Recommend f/u if all symptoms are not cleared in the next 7-10 days.

## 2022-08-25 NOTE — Assessment & Plan Note (Signed)
Left hand pain x 6 weeks. We have not had success with ice, stretching, and immobilization therefore recommend referral to orthopedics for further evaluation. Will request hand specialist. Recommend continue with current treatment until seen. F/U if new or worsening symptoms present.

## 2022-08-25 NOTE — Assessment & Plan Note (Signed)
Repeat labs today. No other concerns at this time. No med changes.

## 2022-08-28 ENCOUNTER — Other Ambulatory Visit: Payer: Self-pay | Admitting: Psychiatry

## 2022-09-15 ENCOUNTER — Other Ambulatory Visit: Payer: Self-pay | Admitting: Nurse Practitioner

## 2022-09-15 DIAGNOSIS — E782 Mixed hyperlipidemia: Secondary | ICD-10-CM

## 2022-09-15 DIAGNOSIS — I2510 Atherosclerotic heart disease of native coronary artery without angina pectoris: Secondary | ICD-10-CM

## 2022-09-17 ENCOUNTER — Other Ambulatory Visit (HOSPITAL_BASED_OUTPATIENT_CLINIC_OR_DEPARTMENT_OTHER): Payer: Self-pay | Admitting: Nurse Practitioner

## 2022-09-17 DIAGNOSIS — R1013 Epigastric pain: Secondary | ICD-10-CM

## 2022-09-22 ENCOUNTER — Other Ambulatory Visit (HOSPITAL_BASED_OUTPATIENT_CLINIC_OR_DEPARTMENT_OTHER): Payer: Self-pay | Admitting: Nurse Practitioner

## 2022-09-22 ENCOUNTER — Other Ambulatory Visit: Payer: Self-pay | Admitting: Nurse Practitioner

## 2022-09-22 DIAGNOSIS — M542 Cervicalgia: Secondary | ICD-10-CM

## 2022-09-22 DIAGNOSIS — M65321 Trigger finger, right index finger: Secondary | ICD-10-CM

## 2022-09-22 DIAGNOSIS — R1013 Epigastric pain: Secondary | ICD-10-CM

## 2022-09-22 DIAGNOSIS — M5416 Radiculopathy, lumbar region: Secondary | ICD-10-CM

## 2022-09-22 DIAGNOSIS — G5601 Carpal tunnel syndrome, right upper limb: Secondary | ICD-10-CM

## 2022-09-23 NOTE — Telephone Encounter (Signed)
Refill request pt. Did request refill I called her and asked. Last apt 08/03/22 next apt 10/07/22.

## 2022-09-26 ENCOUNTER — Other Ambulatory Visit (HOSPITAL_BASED_OUTPATIENT_CLINIC_OR_DEPARTMENT_OTHER): Payer: Self-pay | Admitting: Nurse Practitioner

## 2022-09-26 DIAGNOSIS — R1013 Epigastric pain: Secondary | ICD-10-CM

## 2022-09-28 ENCOUNTER — Telehealth: Payer: Self-pay | Admitting: Nurse Practitioner

## 2022-09-28 ENCOUNTER — Other Ambulatory Visit: Payer: Self-pay

## 2022-09-28 DIAGNOSIS — R1013 Epigastric pain: Secondary | ICD-10-CM

## 2022-09-28 MED ORDER — OMEPRAZOLE 40 MG PO CPDR: 40.0000 mg | DELAYED_RELEASE_CAPSULE | Freq: Two times a day (BID) | ORAL | 1 refills | Status: DC

## 2022-09-28 NOTE — Telephone Encounter (Signed)
Pt left message need refill Omeprazole to [facility]

## 2022-09-28 NOTE — Telephone Encounter (Signed)
done

## 2022-10-01 ENCOUNTER — Other Ambulatory Visit: Payer: Self-pay | Admitting: Nurse Practitioner

## 2022-10-01 DIAGNOSIS — I2510 Atherosclerotic heart disease of native coronary artery without angina pectoris: Secondary | ICD-10-CM

## 2022-10-01 DIAGNOSIS — E782 Mixed hyperlipidemia: Secondary | ICD-10-CM

## 2022-10-07 ENCOUNTER — Other Ambulatory Visit: Payer: 59

## 2022-10-07 DIAGNOSIS — E039 Hypothyroidism, unspecified: Secondary | ICD-10-CM

## 2022-10-08 LAB — T4, FREE: Free T4: 1.42 ng/dL (ref 0.82–1.77)

## 2022-10-08 LAB — TSH: TSH: 0.128 u[IU]/mL — ABNORMAL LOW (ref 0.450–4.500)

## 2022-10-12 ENCOUNTER — Other Ambulatory Visit: Payer: Self-pay | Admitting: Nurse Practitioner

## 2022-10-12 DIAGNOSIS — E039 Hypothyroidism, unspecified: Secondary | ICD-10-CM

## 2022-10-12 MED ORDER — LEVOTHYROXINE SODIUM 150 MCG PO TABS: ORAL_TABLET | ORAL | 3 refills | Status: DC
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
## 2022-10-14 ENCOUNTER — Encounter: Payer: Self-pay | Admitting: Psychiatry

## 2022-10-14 ENCOUNTER — Telehealth (INDEPENDENT_AMBULATORY_CARE_PROVIDER_SITE_OTHER): Payer: 59 | Admitting: Psychiatry

## 2022-10-14 DIAGNOSIS — F411 Generalized anxiety disorder: Secondary | ICD-10-CM | POA: Diagnosis not present

## 2022-10-14 DIAGNOSIS — F3342 Major depressive disorder, recurrent, in full remission: Secondary | ICD-10-CM

## 2022-10-28 ENCOUNTER — Encounter: Payer: Self-pay | Admitting: Physician Assistant

## 2022-10-29 ENCOUNTER — Encounter: Payer: Self-pay | Admitting: Radiology

## 2022-10-29 ENCOUNTER — Other Ambulatory Visit: Payer: Self-pay | Admitting: Physician Assistant

## 2022-10-29 DIAGNOSIS — M79642 Pain in left hand: Secondary | ICD-10-CM

## 2022-11-02 ENCOUNTER — Ambulatory Visit
Admission: RE | Admit: 2022-11-02 | Discharge: 2022-11-02 | Disposition: A | Discharge status: Home / Self Care | Payer: 59 | Source: Ambulatory Visit | Attending: Physician Assistant | Admitting: Physician Assistant

## 2022-11-02 DIAGNOSIS — M79642 Pain in left hand: Secondary | ICD-10-CM

## 2022-11-10 ENCOUNTER — Other Ambulatory Visit (HOSPITAL_COMMUNITY): Payer: Self-pay | Admitting: Physician Assistant

## 2022-11-10 ENCOUNTER — Encounter (HOSPITAL_COMMUNITY): Payer: Self-pay | Admitting: Physician Assistant

## 2022-11-10 DIAGNOSIS — S63659A Sprain of metacarpophalangeal joint of unspecified finger, initial encounter: Secondary | ICD-10-CM

## 2022-11-10 DIAGNOSIS — M79642 Pain in left hand: Secondary | ICD-10-CM

## 2022-11-22 ENCOUNTER — Ambulatory Visit (HOSPITAL_COMMUNITY)
Admission: RE | Admit: 2022-11-22 | Discharge: 2022-11-22 | Disposition: A | Discharge status: Home / Self Care | Payer: 59 | Source: Ambulatory Visit | Attending: Physician Assistant | Admitting: Physician Assistant

## 2022-11-22 DIAGNOSIS — M79642 Pain in left hand: Secondary | ICD-10-CM | POA: Diagnosis present

## 2022-11-22 DIAGNOSIS — S63659A Sprain of metacarpophalangeal joint of unspecified finger, initial encounter: Secondary | ICD-10-CM | POA: Insufficient documentation

## 2022-12-03 ENCOUNTER — Ambulatory Visit (HOSPITAL_COMMUNITY): Payer: 59

## 2022-12-16 ENCOUNTER — Other Ambulatory Visit: Payer: 59

## 2022-12-17 ENCOUNTER — Other Ambulatory Visit: Payer: 59

## 2022-12-17 ENCOUNTER — Telehealth: Payer: Self-pay | Admitting: Nurse Practitioner

## 2022-12-17 ENCOUNTER — Other Ambulatory Visit: Payer: Self-pay

## 2022-12-17 DIAGNOSIS — E039 Hypothyroidism, unspecified: Secondary | ICD-10-CM

## 2022-12-17 DIAGNOSIS — R1013 Epigastric pain: Secondary | ICD-10-CM

## 2022-12-17 DIAGNOSIS — I2510 Atherosclerotic heart disease of native coronary artery without angina pectoris: Secondary | ICD-10-CM

## 2022-12-17 DIAGNOSIS — R519 Headache, unspecified: Secondary | ICD-10-CM

## 2022-12-17 DIAGNOSIS — E782 Mixed hyperlipidemia: Secondary | ICD-10-CM

## 2022-12-17 MED ORDER — OMEPRAZOLE 40 MG PO CPDR: 40.0000 mg | DELAYED_RELEASE_CAPSULE | Freq: Two times a day (BID) | ORAL | 1 refills | Status: DC

## 2022-12-17 MED ORDER — LEVOTHYROXINE SODIUM 150 MCG PO TABS: ORAL_TABLET | ORAL | 1 refills | Status: DC

## 2022-12-17 MED ORDER — ATORVASTATIN CALCIUM 40 MG PO TABS: 40.0000 mg | ORAL_TABLET | Freq: Every day | ORAL | 1 refills | Status: DC

## 2022-12-17 NOTE — Telephone Encounter (Signed)
[facility] sent refll request for gabapentin Levothyroxine Liptior Please send to the [facility] mail order

## 2022-12-18 ENCOUNTER — Telehealth: Payer: Self-pay

## 2022-12-18 MED ORDER — GABAPENTIN 800 MG PO TABS: 800.0000 mg | ORAL_TABLET | Freq: Three times a day (TID) | ORAL | 3 refills | Status: DC

## 2022-12-18 NOTE — Telephone Encounter (Signed)
[facility]entin [facility].

## 2022-12-18 NOTE — Telephone Encounter (Signed)
Called to verify pharmacy for the patient no answer left voicemail for patient to return call to office

## 2022-12-24 ENCOUNTER — Other Ambulatory Visit: Payer: Self-pay | Admitting: Nurse Practitioner

## 2022-12-24 ENCOUNTER — Other Ambulatory Visit: Payer: 59

## 2022-12-24 DIAGNOSIS — E039 Hypothyroidism, unspecified: Secondary | ICD-10-CM

## 2022-12-25 ENCOUNTER — Telehealth: Payer: Self-pay

## 2022-12-25 ENCOUNTER — Other Ambulatory Visit: Payer: 59

## 2022-12-25 DIAGNOSIS — M542 Cervicalgia: Secondary | ICD-10-CM

## 2022-12-25 DIAGNOSIS — G5601 Carpal tunnel syndrome, right upper limb: Secondary | ICD-10-CM

## 2022-12-25 DIAGNOSIS — M5416 Radiculopathy, lumbar region: Secondary | ICD-10-CM

## 2022-12-25 DIAGNOSIS — M65321 Trigger finger, right index finger: Secondary | ICD-10-CM

## 2022-12-25 DIAGNOSIS — E039 Hypothyroidism, unspecified: Secondary | ICD-10-CM

## 2022-12-25 LAB — TSH: TSH: 7 u[IU]/mL — ABNORMAL HIGH (ref 0.450–4.500)

## 2022-12-25 NOTE — Telephone Encounter (Signed)
Received refill request from [facility] pharmacy for Montelukast and Cyclobenzaprine.

## 2022-12-29 ENCOUNTER — Telehealth: Payer: Self-pay | Admitting: Nurse Practitioner

## 2022-12-29 NOTE — Telephone Encounter (Signed)
Pt called again about cyclobenzaprine (FLEXERIL) 10 MG tablet , she says she has about two left but also she noticed she needs a refill on her levothyroxine.  She also wanted to know if you had a chance to look over her thyroid lab work?

## 2022-12-29 NOTE — Telephone Encounter (Signed)
Reighlyn called and needs a refill on cyclobenzaprine (FLEXERIL) 10 MG tablet sent to  [facility] [address], [place] - [address]

## 2022-12-31 MED ORDER — LEVOTHYROXINE SODIUM 150 MCG PO TABS: ORAL_TABLET | ORAL | 1 refills | Status: DC

## 2022-12-31 MED ORDER — CYCLOBENZAPRINE HCL 10 MG PO TABS: ORAL_TABLET | ORAL | 0 refills | Status: DC

## 2022-12-31 NOTE — Telephone Encounter (Signed)
Refill for cyclobenzaprine sent to local pharmacy per patient request. Patient is not showing montelukast as being an active medication, therefore we need to ensure that she is taking this.

## 2022-12-31 NOTE — Telephone Encounter (Signed)
Pt called again about medications. Flexeril and Thyroid meds. Sending to [facility].

## 2022-12-31 NOTE — Telephone Encounter (Signed)
Medications have been filled

## 2023-01-01 ENCOUNTER — Other Ambulatory Visit: Payer: Self-pay | Admitting: Nurse Practitioner

## 2023-01-01 ENCOUNTER — Other Ambulatory Visit: Payer: Self-pay

## 2023-01-01 ENCOUNTER — Encounter: Payer: Self-pay | Admitting: Nurse Practitioner

## 2023-01-01 DIAGNOSIS — E039 Hypothyroidism, unspecified: Secondary | ICD-10-CM

## 2023-01-01 DIAGNOSIS — J324 Chronic pansinusitis: Secondary | ICD-10-CM

## 2023-01-01 MED ORDER — MONTELUKAST SODIUM 10 MG PO TABS: 10.0000 mg | ORAL_TABLET | Freq: Every day | ORAL | 1 refills | Status: DC

## 2023-01-01 MED ORDER — LEVOTHYROXINE SODIUM 150 MCG PO TABS: ORAL_TABLET | ORAL | 1 refills | Status: DC

## 2023-01-01 NOTE — Telephone Encounter (Signed)
Please schedule 2 month TSH recheck.
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
## 2023-01-06 ENCOUNTER — Telehealth (INDEPENDENT_AMBULATORY_CARE_PROVIDER_SITE_OTHER): Payer: 59 | Admitting: Psychiatry

## 2023-01-06 ENCOUNTER — Encounter: Payer: Self-pay | Admitting: Psychiatry

## 2023-01-06 DIAGNOSIS — F411 Generalized anxiety disorder: Secondary | ICD-10-CM | POA: Diagnosis not present

## 2023-01-06 DIAGNOSIS — F3342 Major depressive disorder, recurrent, in full remission: Secondary | ICD-10-CM | POA: Diagnosis not present

## 2023-01-07 ENCOUNTER — Telehealth: Payer: Self-pay | Admitting: Family Medicine

## 2023-01-07 DIAGNOSIS — M5416 Radiculopathy, lumbar region: Secondary | ICD-10-CM

## 2023-01-07 DIAGNOSIS — G5601 Carpal tunnel syndrome, right upper limb: Secondary | ICD-10-CM

## 2023-01-07 DIAGNOSIS — M542 Cervicalgia: Secondary | ICD-10-CM

## 2023-01-07 DIAGNOSIS — M65321 Trigger finger, right index finger: Secondary | ICD-10-CM

## 2023-01-07 NOTE — Telephone Encounter (Signed)
[facility] refill req Cyclobensaprine tab

## 2023-01-08 NOTE — Telephone Encounter (Signed)
Medication sent to [facility] on file 12/31/2022 for 90 day supply. Please contact Laura [facility]ett to ask if this was an automatic request or if she needs [place] to cancel the local script and send this mail order.

## 2023-01-09 ENCOUNTER — Encounter: Payer: Self-pay | Admitting: Psychiatry

## 2023-01-09 LAB — VITAMIN D 25 HYDROXY (VIT D DEFICIENCY, FRACTURES): Vit D, 25-Hydroxy: 50 ng/mL (ref 30.0–100.0)

## 2023-01-19 ENCOUNTER — Telehealth (INDEPENDENT_AMBULATORY_CARE_PROVIDER_SITE_OTHER): Payer: 59 | Admitting: Nurse Practitioner

## 2023-01-19 VITALS — Wt 185.0 lb

## 2023-01-19 DIAGNOSIS — K047 Periapical abscess without sinus: Secondary | ICD-10-CM

## 2023-01-19 MED ORDER — LIDOCAINE VISCOUS HCL 2 % MT SOLN: 15.0000 mL | Freq: Four times a day (QID) | OROMUCOSAL | 0 refills | Status: DC | PRN

## 2023-01-19 MED ORDER — NYSTATIN 100000 UNIT/ML MT SUSP: 5.0000 mL | Freq: Four times a day (QID) | OROMUCOSAL | 0 refills | Status: DC

## 2023-01-19 MED ORDER — AMOXICILLIN-POT CLAVULANATE 875-125 MG PO TABS: 1.0000 | ORAL_TABLET | Freq: Two times a day (BID) | ORAL | 0 refills | Status: DC

## 2023-01-19 NOTE — Progress Notes (Signed)
Virtual Visit Encounter mychart visit.   I connected with  [name] on 02/10/23 at  8:15 AM EDT by secure video and audio telemedicine application. I verified that I am speaking with the correct person using two identifiers.   I introduced myself as a Publishing rights manager with the practice. The limitations of evaluation and management by telemedicine discussed with the patient and the availability of in person appointments. The patient expressed verbal understanding and consent to proceed.  Participating parties in this visit include: Myself and patient  The patient is: Patient Location: Home I am: Provider Location: Office/Clinic Subjective:    CC and HPI: Laura [facility] is a 57 y.o. year old female presenting for new evaluation and treatment of dental infection. Patient reports the following:  Laura Burnett was recently seen at [facility] ED for possible infection following dental surgery with removal of a right lower molar. She was started on clindamycin 4 times a day for 5 days and given chlorhexidine mouthwash to use for infection. She reports that since being seen she has continued to experience pain and swelling in the jaw. She has also noticed a white patch on the posterior oropharynx and a blister-like area on the gum near where the tooth was removed. She will finish the antibiotic in the next 24 hours.   Past medical history, Surgical history, Family history not pertinant except as noted below, Social history, Allergies, and medications have been entered into the medical record, reviewed, and corrections made.   Review of Systems:  All review of systems negative except what is listed in the HPI  Objective:    Alert and oriented x 4 Swelling noted to the right side of the jaw line.  Oral cavity erythematous with pale appearing mark at the posterior portion of the soft palate, Approximate area involved 2-3cm  Erythema noted to gum line.  Speaking in clear sentences with no shortness of  breath. No distress.  Impression and Recommendations:    Problem List Items Addressed This Visit     Dental infection - Primary    Ongoing dental infection following recent dental procedure. She will complete clindamycin in the next day or so, but infection is still present.  Plan: - Augmentin added to clindamycin for additional antimicrobial protection that may not be covered with initial antibiotic.  - Nystatin added for possible thrush       Relevant Medications   clindamycin (CLEOCIN) 300 MG capsule   amoxicillin-clavulanate (AUGMENTIN) 875-125 MG tablet   lidocaine (XYLOCAINE) 2 % solution   nystatin (MYCOSTATIN) 100000 UNIT/ML suspension    orders and follow up as documented in EMR I discussed the assessment and treatment plan with the patient. The patient was provided an opportunity to ask questions and all were answered. The patient agreed with the plan and demonstrated an understanding of the instructions.   The patient was advised to call back or seek an in-person evaluation if the symptoms worsen or if the condition fails to improve as anticipated.  Follow-Up: prn  I provided 22 minutes of non-face-to-face interaction with this non face-to-face encounter including intake, same-day documentation, and chart review.   Tollie Eth, NP , DNP, AGNP-c [facility]

## 2023-01-20 ENCOUNTER — Encounter: Payer: Self-pay | Admitting: Nurse Practitioner

## 2023-01-30 ENCOUNTER — Other Ambulatory Visit: Payer: Self-pay | Admitting: Psychiatry

## 2023-01-30 DIAGNOSIS — F411 Generalized anxiety disorder: Secondary | ICD-10-CM

## 2023-02-01 ENCOUNTER — Telehealth (INDEPENDENT_AMBULATORY_CARE_PROVIDER_SITE_OTHER): Payer: 59 | Admitting: Nurse Practitioner

## 2023-02-01 VITALS — Wt 185.0 lb

## 2023-02-01 DIAGNOSIS — K047 Periapical abscess without sinus: Secondary | ICD-10-CM | POA: Diagnosis not present

## 2023-02-01 DIAGNOSIS — R519 Headache, unspecified: Secondary | ICD-10-CM | POA: Diagnosis not present

## 2023-02-01 DIAGNOSIS — R052 Subacute cough: Secondary | ICD-10-CM | POA: Diagnosis not present

## 2023-02-01 MED ORDER — ALBUTEROL SULFATE HFA 108 (90 BASE) MCG/ACT IN AERS: INHALATION_SPRAY | RESPIRATORY_TRACT | 0 refills | Status: DC

## 2023-02-01 MED ORDER — IBUPROFEN 800 MG PO TABS: 800.0000 mg | ORAL_TABLET | Freq: Three times a day (TID) | ORAL | 3 refills | Status: DC | PRN

## 2023-02-01 NOTE — Progress Notes (Signed)
Virtual Visit Encounter mychart visit.   I connected with  Laura [facility]ett on 02/01/23 at  1:30 PM EDT by secure video and audio telemedicine application. I verified that I am speaking with the correct person using two identifiers.   I introduced myself as a Publishing rights manager with the practice. The limitations of evaluation and management by telemedicine discussed with the patient and the availability of in person appointments. The patient expressed verbal understanding and consent to proceed.  Participating parties in this visit include: Myself and patient  The patient is: Patient Location: Home I am: Provider Location: Office/Clinic Subjective:    CC and HPI: Laura [facility] is a 57 y.o. year old female presenting for follow up of dental infection. Patient reports the following: Laura Burnett presents today with several concerns.  Dental Issue She describes a persistent issue with a tooth extraction site in her gum, noting a small hole and a sensation of possibly hitting a bone with a dental pick, which she uses to ensure no food is trapped. She expresses frustration that the extraction did not resolve her facial and neck pain, which still recurs.  Throat Issue Laura Burnett reports a problem with thick mucus in her throat, particularly in the mornings, which she finds difficult to clear. She uses saline spray to manage this issue but is concerned about the cause, wondering if it might be related to her sinuses or another condition.  Temperature Concerns She also mentions experiencing warmth and a slightly elevated temperature, which she speculates could be related to her thyroid or possibly fibromyalgia, though she is cautious about jumping to conclusions.  Physical Discomfort Laura Burnett is also dealing with physical discomfort in her hands and numbness extending down her leg, which she plans to discuss further with her foot doctor.  Facial Swelling Lastly, Laura [facility]ett discusses her concerns about potential swelling on  one side of her face, which she feels might be affecting her facial symmetry. She is unsure if this is due to the previous dental issues or another underlying condition. She mentions family history of mini-strokes, expressing worry about her symptoms possibly being related.  Past medical history, Surgical history, Family history not pertinant except as noted below, Social history, Allergies, and medications have been entered into the medical record, reviewed, and corrections made.   Review of Systems:  All review of systems negative except what is listed in the HPI  Objective:    Alert and oriented x 4 Speaking in clear sentences with no shortness of breath. No distress.  Impression and Recommendations:    Problem List Items Addressed This Visit   None   orders and follow up as documented in EMR I discussed the assessment and treatment plan with the patient. The patient was provided an opportunity to ask questions and all were answered. The patient agreed with the plan and demonstrated an understanding of the instructions.   The patient was advised to call back or seek an in-person evaluation if the symptoms worsen or if the condition fails to improve as anticipated.  Follow-Up: prn  I provided 28 minutes of non-face-to-face interaction with this non face-to-face encounter including intake, same-day documentation, and chart review.   Tollie Eth, NP , DNP, AGNP-c [facility]

## 2023-02-03 ENCOUNTER — Ambulatory Visit (INDEPENDENT_AMBULATORY_CARE_PROVIDER_SITE_OTHER): Payer: 59 | Admitting: Podiatry

## 2023-02-03 ENCOUNTER — Ambulatory Visit (INDEPENDENT_AMBULATORY_CARE_PROVIDER_SITE_OTHER): Payer: 59

## 2023-02-03 ENCOUNTER — Encounter: Payer: Self-pay | Admitting: Podiatry

## 2023-02-03 DIAGNOSIS — M7751 Other enthesopathy of right foot: Secondary | ICD-10-CM

## 2023-02-03 DIAGNOSIS — M7671 Peroneal tendinitis, right leg: Secondary | ICD-10-CM | POA: Diagnosis not present

## 2023-02-03 MED ORDER — TRIAMCINOLONE ACETONIDE 10 MG/ML IJ SUSP
10.0000 mg | Freq: Once | INTRAMUSCULAR | Status: AC
  Administered 2023-02-03: 10 mg

## 2023-02-03 NOTE — Progress Notes (Signed)
Subjective:   Patient ID: Laura [facility], female   DOB: 57 y.o.   MRN: [id number]   HPI Patient states over the last month she has developed a lot of pain in the bottom of her right foot feels like it hard to walk on it.  Overall doing very well from the surgery very pleased after neurovascular status intact inflammation pain of the right plantar first metatarsal around the sesamoidal complex fluid buildup with excellent range of motion first MPJ no crepitus of the joint noted   ROS      Objective:  Physical Exam  Inflammatory capsulitis of the first MPJ plantar right painful when pressed fluid buildup noted     Assessment:  The pain is consistent with inflammatory capsulitis plantar joint     Plan:  H&P reviewed at this point I went ahead and I did do sterile prep injected the plantar capsule 3 mg Dexasone Kenalog 5 mg Xylocaine and I applied a dancers pad to offload weight off the metatarsal and hopefully should heal uneventfully.  Gave instructions on supportive shoes reappoint as needed  X-rays indicate the pins are intact joints in excellent position no other signs of pathology

## 2023-02-10 ENCOUNTER — Encounter: Payer: Self-pay | Admitting: Nurse Practitioner

## 2023-02-10 DIAGNOSIS — K047 Periapical abscess without sinus: Secondary | ICD-10-CM | POA: Insufficient documentation

## 2023-02-10 NOTE — Assessment & Plan Note (Signed)
Ongoing dental infection following recent dental procedure. She will complete clindamycin in the next day or so, but infection is still present.  Plan: - Augmentin added to clindamycin for additional antimicrobial protection that may not be covered with initial antibiotic.  - Nystatin added for possible thrush

## 2023-02-11 ENCOUNTER — Other Ambulatory Visit: Payer: Self-pay | Admitting: Nurse Practitioner

## 2023-02-11 DIAGNOSIS — E782 Mixed hyperlipidemia: Secondary | ICD-10-CM

## 2023-02-11 DIAGNOSIS — I2510 Atherosclerotic heart disease of native coronary artery without angina pectoris: Secondary | ICD-10-CM
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
## 2023-02-22 ENCOUNTER — Telehealth (INDEPENDENT_AMBULATORY_CARE_PROVIDER_SITE_OTHER): Payer: 59 | Admitting: Psychiatry

## 2023-02-22 ENCOUNTER — Encounter: Payer: Self-pay | Admitting: Psychiatry

## 2023-02-22 DIAGNOSIS — F330 Major depressive disorder, recurrent, mild: Secondary | ICD-10-CM | POA: Diagnosis not present

## 2023-02-22 DIAGNOSIS — F411 Generalized anxiety disorder: Secondary | ICD-10-CM

## 2023-02-22 MED ORDER — DULOXETINE HCL 60 MG PO CPEP: 120.0000 mg | ORAL_CAPSULE | Freq: Every day | ORAL | 1 refills | Status: DC

## 2023-02-22 MED ORDER — BUPROPION HCL ER (XL) 150 MG PO TB24: 450.0000 mg | ORAL_TABLET | Freq: Every day | ORAL | 1 refills | Status: DC

## 2023-02-24 ENCOUNTER — Other Ambulatory Visit: Payer: Self-pay | Admitting: Nurse Practitioner

## 2023-02-24 DIAGNOSIS — R052 Subacute cough: Secondary | ICD-10-CM

## 2023-02-28 ENCOUNTER — Encounter: Payer: Self-pay | Admitting: Nurse Practitioner

## 2023-02-28 NOTE — Assessment & Plan Note (Signed)
Patient reports a small hole remaining and a white spot in the extraction site of tooth 231. No signs of infection or oozing. CT scan showed a small amount of fluid, air, and osseous debris in the socket. Plan: - Continue monitoring the healing process. - Encourage patient to keep the area clean by flushing it regularly. - Reassure patient that the healing process may take time and osseous debris may work its way out.

## 2023-02-28 NOTE — Assessment & Plan Note (Signed)
Patient reports persistent swelling on the right side of the face, which may be related to the tooth extraction. Plan:  - Advise patient to continue icing the area once or twice daily to help reduce swelling. - Monitor for any signs of infection or worsening swelling.

## 2023-03-10 ENCOUNTER — Other Ambulatory Visit: Payer: Self-pay

## 2023-03-10 ENCOUNTER — Other Ambulatory Visit: Payer: 59

## 2023-03-10 DIAGNOSIS — E039 Hypothyroidism, unspecified: Secondary | ICD-10-CM

## 2023-03-11 LAB — TSH: TSH: 0.886 u[IU]/mL (ref 0.450–4.500)
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
## 2023-03-24 ENCOUNTER — Encounter: Payer: Self-pay | Admitting: Psychiatry

## 2023-03-24 ENCOUNTER — Telehealth (INDEPENDENT_AMBULATORY_CARE_PROVIDER_SITE_OTHER): Payer: 59 | Admitting: Psychiatry

## 2023-03-24 DIAGNOSIS — F411 Generalized anxiety disorder: Secondary | ICD-10-CM

## 2023-03-24 DIAGNOSIS — F3341 Major depressive disorder, recurrent, in partial remission: Secondary | ICD-10-CM | POA: Diagnosis not present

## 2023-03-31 ENCOUNTER — Other Ambulatory Visit: Payer: Self-pay | Admitting: Nurse Practitioner

## 2023-03-31 DIAGNOSIS — M65321 Trigger finger, right index finger: Secondary | ICD-10-CM

## 2023-03-31 DIAGNOSIS — M542 Cervicalgia: Secondary | ICD-10-CM

## 2023-03-31 DIAGNOSIS — G5601 Carpal tunnel syndrome, right upper limb: Secondary | ICD-10-CM

## 2023-03-31 DIAGNOSIS — M5416 Radiculopathy, lumbar region: Secondary | ICD-10-CM

## 2023-03-31 NOTE — Telephone Encounter (Signed)
Refill request last apt. 02/01/23

## 2023-04-01 ENCOUNTER — Telehealth: Payer: Self-pay

## 2023-04-01 NOTE — Telephone Encounter (Signed)
Pt called regarding cyclobenzaprine rx. Originally prescribed a 3 month supply. Last appt. 02/01/23. Next appt. 04/06/23.

## 2023-04-04 MED ORDER — CYCLOBENZAPRINE HCL 10 MG PO TABS: ORAL_TABLET | ORAL | 0 refills | Status: DC

## 2023-04-06 ENCOUNTER — Encounter: Payer: Self-pay | Admitting: Nurse Practitioner

## 2023-04-06 ENCOUNTER — Ambulatory Visit (INDEPENDENT_AMBULATORY_CARE_PROVIDER_SITE_OTHER): Payer: 59 | Admitting: Nurse Practitioner

## 2023-04-06 VITALS — BP 124/80 | HR 102 | Ht 62.0 in | Wt 186.4 lb

## 2023-04-06 DIAGNOSIS — F419 Anxiety disorder, unspecified: Secondary | ICD-10-CM

## 2023-04-06 DIAGNOSIS — E039 Hypothyroidism, unspecified: Secondary | ICD-10-CM | POA: Diagnosis not present

## 2023-04-06 DIAGNOSIS — M5441 Lumbago with sciatica, right side: Secondary | ICD-10-CM | POA: Diagnosis not present

## 2023-04-06 DIAGNOSIS — R202 Paresthesia of skin: Secondary | ICD-10-CM

## 2023-04-06 DIAGNOSIS — Z0000 Encounter for general adult medical examination without abnormal findings: Secondary | ICD-10-CM | POA: Diagnosis not present

## 2023-04-06 DIAGNOSIS — G8929 Other chronic pain: Secondary | ICD-10-CM

## 2023-04-06 DIAGNOSIS — E782 Mixed hyperlipidemia: Secondary | ICD-10-CM

## 2023-04-06 DIAGNOSIS — R197 Diarrhea, unspecified: Secondary | ICD-10-CM

## 2023-04-06 DIAGNOSIS — I2510 Atherosclerotic heart disease of native coronary artery without angina pectoris: Secondary | ICD-10-CM

## 2023-04-06 MED ORDER — ONDANSETRON 4 MG PO TBDP: 4.0000 mg | ORAL_TABLET | Freq: Three times a day (TID) | ORAL | 3 refills | Status: AC | PRN

## 2023-04-06 MED ORDER — PREDNISONE 20 MG PO TABS: 20.0000 mg | ORAL_TABLET | Freq: Every day | ORAL | 1 refills | Status: DC

## 2023-04-06 MED ORDER — METHYLPREDNISOLONE SODIUM SUCC 125 MG IJ SOLR
125.0000 mg | Freq: Once | INTRAMUSCULAR | Status: AC
  Administered 2023-04-06: 62.5 mg via INTRAMUSCULAR

## 2023-04-06 MED ORDER — KETOROLAC TROMETHAMINE 30 MG/ML IJ SOLN
30.0000 mg | Freq: Once | INTRAMUSCULAR | Status: AC
  Administered 2023-04-06: 30 mg via INTRAMUSCULAR

## 2023-04-06 NOTE — Progress Notes (Signed)
Primary Care & Sports Medicine [facility] at [facility] [address]  [address] [place], [place]  [address] [phone]   MEDICARE Laura Burnett VISIT  04/19/2023  Subjective:  Laura Burnett is a 57 y.o. female patient of Shakedra Beam, Sung Amabile, NP who had a Medicare Annual Wellness Visit today. Laura Burnett is Retired and lives alone. she reports that she is socially active and does interact with friends/family regularly. she is moderately physically active and enjoys spending time with grandchild.    Patient Care Team: Selig Wampole, Sung Amabile, NP as PCP - General (Nurse Practitioner) Oretha Milch, MD as Consulting Physician (Pulmonary Disease) Orlene Och, MD as Referring Physician (Pediatrics) [name], MD as Consulting Physician (Psychiatry) Eldred Manges, MD as Consulting Physician (Orthopedic Surgery) Laren Boom, DO as Consulting Physician (Otolaryngology) Romero Belling, MD (Inactive) as Consulting Physician (Endocrinology) Rachael Fee, MD as Attending Physician (Gastroenterology)     04/06/2023    1:45 PM 04/30/2021   11:51 AM 03/12/2021    1:34 PM 01/08/2021    2:15 PM 12/24/2020   11:48 AM 12/02/2020    4:11 PM 10/11/2020   10:48 AM  Advanced Directives  Does Patient Have a Medical Advance Directive? No No No No No No No  Would patient like information on creating a medical advance directive? No - Patient declined No - Patient declined No - Patient declined No - Patient declined No - Patient declined No - Patient declined No - Patient declined    Hospital Utilization Over the Past 12 Months: # of hospitalizations or ER visits: 2 # of surgeries: 0  Review of Systems    Patient reports that her overall health is unchanged when compared to last year.  Review of Systems: Negative except She tells me she has been having worse numbness and burning in her right leg. She had this start when she had spinal fusion surgery several years ago  (2019). She reports that the leg from the knee down is numb and burns with pain. She will occassionally having shooting pain.   All other systems negative.  Pain Assessment  4/10 today   Current Medications & Allergies (verified) Allergies as of 04/06/2023       Reactions   Ciprofloxacin Nausea And Vomiting   Phentermine Other (See Comments)   Tearing, lightheadedness, depression   Tape Other (See Comments)   " I get red and itchy." Paper tape okay   Bactrim [sulfamethoxazole-trimethoprim] Swelling, Rash   Percocet [oxycodone-acetaminophen] Hives, Rash   Tetanus Toxoids Nausea And Vomiting   Tylenol With Codeine #3 [acetaminophen-codeine] Rash        Medication List        Accurate as of April 06, 2023 11:59 PM. If you have any questions, ask your nurse or doctor.          STOP taking these medications    amoxicillin-clavulanate 875-125 MG tablet Commonly known as: AUGMENTIN Stopped by: Sung Amabile Axel Frisk   clindamycin 300 MG capsule Commonly known as: CLEOCIN Stopped by: Tollie Eth   Iron (Ferrous Sulfate) 325 (65 Fe) MG Tabs Stopped by: Sung Amabile Diksha Tagliaferro   lidocaine 2 % solution Commonly known as: XYLOCAINE Stopped by: Tollie Eth   nystatin 100000 UNIT/ML suspension Commonly known as: MYCOSTATIN Stopped by: Tollie Eth       TAKE these medications    albuterol 108 (90 Base) MCG/ACT inhaler Commonly known as: VENTOLIN HFA INHALE 2 PUFFS BY MOUTH EVERY  4 HOURS AS NEEDED FOR WHEEZING OR SHORTNESS OF BREATH   atorvastatin 40 MG tablet Commonly known as: LIPITOR Take 1 tablet (40 mg total) by mouth daily.   buPROPion 150 MG 24 hr tablet Commonly known as: WELLBUTRIN XL Take 3 tablets (450 mg total) by mouth daily.   cyclobenzaprine 10 MG tablet Commonly known as: FLEXERIL Must begin to taper: 1 tablet every 12 hours for muscle spasms. May take 1 additional dose at least 8 hours from other doses when severe spasms occur.   DULoxetine 60 MG  capsule Commonly known as: CYMBALTA Take 2 capsules (120 mg total) by mouth at bedtime.   gabapentin 800 MG tablet Commonly known as: NEURONTIN Take 1 tablet (800 mg total) by mouth 3 (three) times daily.   ibuprofen 800 MG tablet Commonly known as: ADVIL Take 1 tablet (800 mg total) by mouth every 8 (eight) hours as needed.   levothyroxine 100 MCG tablet Commonly known as: SYNTHROID 1 tab ( )  by mouth 6 days a week. Take 1.5 tab ( ) by mouth 1 day a week. Take on empty stomach with no other medications, wait 30 minutes before eating. What changed:  medication strength additional instructions Changed by: Sung Amabile Terrion Poblano   montelukast 10 MG tablet Commonly known as: SINGULAIR Take 1 tablet (10 mg total) by mouth at bedtime.   omeprazole 40 MG capsule Commonly known as: PRILOSEC Take 1 capsule (40 mg total) by mouth 2 (two) times daily.   ondansetron 4 MG disintegrating tablet Commonly known as: ZOFRAN-ODT Take 1 tablet (4 mg total) by mouth every 8 (eight) hours as needed for nausea or vomiting. Started by: Tollie Eth   predniSONE 20 MG tablet Commonly known as: DELTASONE Take 1 tablet (20 mg total) by mouth daily with breakfast. Started by: Tollie Eth        History (reviewed): Past Medical History:  Diagnosis Date   Abdominal pain, acute, epigastric 07/21/2019   Abscess 01/03/2022   Allergy    seasonal allergies/animals   Anxiety    Arthritis    Asthma    as a child " exercise induced"   Bronchitis    Constipation    Depression    DJD (degenerative joint disease)    Encounter for medical examination to establish care 06/16/2021   Fibromyalgia    GERD (gastroesophageal reflux disease)    Headache    Hypothyroidism    Obesity    Perioral dermatitis 03/12/2021   PONV (postoperative nausea and vomiting)    Wears glasses    Past Surgical History:  Procedure Laterality Date   ABDOMINAL HYSTERECTOMY     ADENOIDECTOMY     APPLICATION OF  ROBOTIC ASSISTANCE FOR SPINAL PROCEDURE N/A 12/21/2016   Procedure: APPLICATION OF ROBOTIC ASSISTANCE FOR SPINAL PROCEDURE;  Surgeon: Loura Halt Ditty, MD;  [facility]: [facility];  Service: Neurosurgery;  Laterality: N/A;   BACK SURGERY     L5-S1   CARPAL TUNNEL RELEASE Right 01/16/2019   Procedure: RIGHT CARPAL TUNNEL RELEASE;  Surgeon: Eldred Manges, MD;  Location: [facility];  Service: Orthopedics;  Laterality: Right;   COLONOSCOPY     CYST EXCISION     uterine ;subsequent sx followed   FOOT SURGERY     HAND SURGERY     from AA   KNEE ARTHROSCOPY     x2   TONSILLECTOMY     TRIGGER FINGER RELEASE Right 01/16/2019   Procedure: RIGHT TRIGGER THUMB RELEASE;  Surgeon: Ophelia Charter,  Veverly Fells, MD;  Location: [facility];  Service: Orthopedics;  Laterality: Right;   TRIGGER FINGER RELEASE Right 12/04/2019   Procedure: right index trigger finger release;  Surgeon: Eldred Manges, MD;  Location: [facility];  Service: Orthopedics;  Laterality: Right;   UPPER GASTROINTESTINAL ENDOSCOPY     Family History  Problem Relation Age of Onset   Alzheimer's disease Mother    Diabetes Mother    Heart attack Father    Colon cancer Father    Heart failure Father    Thyroid disease Sister    Thyroid disease Brother    Stomach cancer Brother    Cancer Brother    Cancer Brother    ADD / ADHD Other    Esophageal cancer Neg Hx    Rectal cancer Neg Hx    Social History   Socioeconomic History   Marital status: Divorced    Spouse name: Not on file   Number of children: 1   Years of education: 12   Highest education level: High school graduate  Occupational History   Occupation: Disabled   Tobacco Use   Smoking status: Former   Smokeless tobacco: Never   Tobacco comments:    stopped smoking cigarettes at age 36  Vaping Use   Vaping status: Never Used  Substance and Sexual Activity   Alcohol use: Not Currently   Drug use: No   Sexual activity: Not Currently   Other Topics Concern   Not on file  Social History Narrative   Patient lives alone in [place], [place].   Patient has one son who lives nearby.    Patient has 2 grandchildren.    Patient enjoys sewing, being outside in her garden, and spending time with family.    Social Determinants of Health   Financial Resource Strain: Medium Risk (03/27/2022)   Overall Financial Resource Strain (CARDIA)    Difficulty of Paying Living Expenses: Somewhat hard  Food Insecurity: Food Insecurity Present (03/27/2022)   Hunger Vital Sign    Worried About Running Out of Food in the Last Year: Sometimes true    Ran Out of Food in the Last Year: Never true  Transportation Needs: No Transportation Needs (12/24/2020)   PRAPARE - Administrator, Civil Service (Medical): No    Lack of Transportation (Non-Medical): No  Physical Activity: Insufficiently Active (03/27/2022)   Exercise Vital Sign    Days of Exercise per Week: 1 day    Minutes of Exercise per Session: 20 min  Stress: No Stress Concern Present (03/27/2022)   [employer] of Occupational Health - Occupational Stress Questionnaire    Feeling of Stress : Only a little  Social Connections: Socially Isolated (03/27/2022)   Social Connection and Isolation Panel [NHANES]    Frequency of Communication with Friends and Family: More than three times a week    Frequency of Social Gatherings with Friends and Family: Twice a week    Attends Religious Services: Never    Database administrator or Organizations: No    Attends Banker Meetings: Never    Marital Status: Never married    Activities of Daily Living    04/06/2023    1:46 PM  In your present state of health, do you have any difficulty performing the following activities:  Hearing? 0  Vision? 0  Difficulty concentrating or making decisions? 0  Walking or climbing stairs? 0  Dressing or bathing? 0  Doing errands, shopping? 0  Preparing Food and eating ? N  Using the Toilet?  N  In the past six months, have you accidently leaked urine? N  Do you have problems with loss of bowel control? Y  Comment past few day diarrhea  Managing your Medications? N  Managing your Finances? N  Housekeeping or managing your Housekeeping? Y  Comment little harder    Diet Patient reports consuming 3 meals a day and 0 snack(s) a day Patient reports that her primary diet is: Regular Patient reports that she does have regular access to food.   Depression Screen    04/06/2023    1:42 PM 03/27/2022    1:05 PM 03/18/2022    8:44 AM 12/25/2021   10:47 AM 10/27/2021    2:10 PM 09/22/2021    1:13 PM 06/16/2021    1:47 PM  PHQ 2/9 Scores  PHQ - 2 Score 2 0 1 0 1  2  PHQ- 9 Score 5 0 8    8  Exception Documentation   Medical reason Medical reason        Information is confidential and restricted. Go to Review Flowsheets to unlock data.     Fall Risk    04/06/2023    1:42 PM 04/29/2022   10:43 AM 03/27/2022    1:09 PM 03/25/2022    9:50 AM 03/18/2022    8:44 AM  Fall Risk   Falls in the past year? 0 0 0 0 0  Number falls in past yr: 0 0 0 0 0  Injury with Fall? 0 0 0 0 0  Risk for fall due to : No Fall Risks No Fall Risks No Fall Risks  No Fall Risks  Follow up Falls evaluation completed Education provided;Falls evaluation completed Falls evaluation completed  Falls evaluation completed;Education provided     Objective:   BP 124/80   Pulse (!) 102   Ht 5\' 2"  (1.575 m)   Wt 186 lb 6.4 oz (84.6 kg)   BMI 34.09 kg/m   Last Weight  Most recent update: 04/06/2023  1:42 PM    Weight  84.6 kg (186 lb 6.4 oz)             Body mass index is 34.09 kg/m. Physical Exam Vitals and nursing note reviewed.  Constitutional:      Appearance: Normal appearance. She is not ill-appearing.  HENT:     Head: Normocephalic.  Neck:     Vascular: No carotid bruit.  Cardiovascular:     Rate and Rhythm: Normal rate and regular rhythm.     Pulses: Normal pulses.     Heart sounds: Normal  heart sounds.  Pulmonary:     Effort: Pulmonary effort is normal.     Breath sounds: Normal breath sounds.  Abdominal:     General: Bowel sounds are normal.     Palpations: Abdomen is soft.  Musculoskeletal:     Cervical back: Normal range of motion.     Right lower leg: No edema.     Left lower leg: No edema.  Lymphadenopathy:     Cervical: No cervical adenopathy.  Skin:    General: Skin is warm and dry.     Capillary Refill: Capillary refill takes less than 2 seconds.  Neurological:     General: No focal deficit present.     Mental Status: She is alert and oriented to person, place, and time.  Psychiatric:        Behavior: Behavior normal.  Hearing/Vision  Laura Burnett did not have difficulty with hearing/understanding during the face-to-face interview Laura Burnett did not have difficulty with her vision during the face-to-face interview Reports that she has had a formal eye exam by an eye care professional within the past year Reports that she has not had a formal hearing evaluation within the past year  Cognitive Function:    03/27/2022    1:11 PM 12/24/2020   11:50 AM  6CIT Screen  What Year? 0 points 0 points  What month?  0 points  What time? 0 points 0 points  Count back from 20 0 points 0 points  Months in reverse 0 points 0 points  Repeat phrase 0 points 0 points  Total Score  0 points    Normal Cognitive Function Screening: Yes (Normal:0-7, Significant for Dysfunction: >8)  Immunization & Health Maintenance Record Immunization History  Administered Date(s) Administered   Influenza,inj,Quad PF,6+ Mos 07/06/2018, 05/09/2019, 06/04/2020   Influenza-Unspecified 05/13/2021, 04/10/2022   Moderna Sars-Covid-2 Vaccination 11/23/2019, 12/21/2019, 07/08/2020    Health Maintenance  Topic Date Due   DTaP/Tdap/Td (1 - Tdap) Never done   Zoster Vaccines- Shingrix (1 of 2) Never done   COVID-19 Vaccine (4 - 2023-24 season) 05/01/2022   INFLUENZA VACCINE  04/01/2023    MAMMOGRAM  09/20/2023   Medicare Annual Wellness (AWV)  04/11/2024   Colonoscopy  09/05/2024   HPV VACCINES  Aged Out   PAP SMEAR-Modifier  Discontinued   Hepatitis C Screening  Discontinued   HIV Screening  Discontinued       Assessment  This is a routine wellness examination for Laura Burnett.  Health Maintenance: Due or Overdue Health Maintenance Due  Topic Date Due   DTaP/Tdap/Td (1 - Tdap) Never done   Zoster Vaccines- Shingrix (1 of 2) Never done   COVID-19 Vaccine (4 - 2023-24 season) 05/01/2022   INFLUENZA VACCINE  04/01/2023    Laura Burnett does not need a referral for Community Assistance: Care Management:   no Social Work:    no Prescription Assistance:  no Nutrition/Diabetes Education:  no   Plan:  Personalized Goals  Goals Addressed   None    Personalized Health Maintenance & Screening Recommendations  Influenza vaccine Td vaccine COVID Vaccine, shingrix  Lung Cancer Screening Recommended: not applicable (Low Dose CT Chest recommended if Age 33-80 years, 30 pack-year currently smoking OR have quit w/in past 15 years) Hepatitis C Screening recommended: not applicable HIV Screening recommended: not applicable  Advanced Directives: Written information was not given per the patient's request.  Referrals & Orders Orders Placed This Encounter  Procedures   DG Lumbar Spine 2-3 Views   CBC with Differential/Platelet   CMP14+EGFR   Lipid panel   Hemoglobin A1c   TSH   TSH    Follow-up Plan Follow-up with Florentina Marquart, Sung Amabile, NP as planned    I have personally reviewed and noted the following in the patient's chart:   Medical and social history Use of alcohol, tobacco or illicit drugs  Current medications and supplements Functional ability and status Nutritional status Physical activity Advanced directives List of other physicians Hospitalizations, surgeries, and ER visits in previous 12 months Vitals Screenings to include cognitive, depression,  and falls Referrals and appointments  In addition, I have reviewed and discussed with patient certain preventive protocols, quality metrics, and best practice recommendations. A written personalized care plan for preventive services as well as general preventive health recommendations were provided to patient.     Sung Amabile  Akeema Broder, DNP, AGNP-c   04/19/2023

## 2023-04-07 ENCOUNTER — Encounter: Payer: Self-pay | Admitting: Nurse Practitioner

## 2023-04-08 LAB — CBC WITH DIFFERENTIAL/PLATELET
Basophils Absolute: 0 x10E3/uL (ref 0.0–0.2)
Basos: 0 %
EOS (ABSOLUTE): 0 x10E3/uL (ref 0.0–0.4)
Eos: 0 %
Hematocrit: 41.2 % (ref 34.0–46.6)
Hemoglobin: 13.4 g/dL (ref 11.1–15.9)
Immature Grans (Abs): 0 x10E3/uL (ref 0.0–0.1)
Immature Granulocytes: 0 %
Lymphocytes Absolute: 1.6 x10E3/uL (ref 0.7–3.1)
Lymphs: 16 %
MCH: 29.6 pg (ref 26.6–33.0)
MCHC: 32.5 g/dL (ref 31.5–35.7)
MCV: 91 fL (ref 79–97)
Monocytes Absolute: 1.1 x10E3/uL — ABNORMAL HIGH (ref 0.1–0.9)
Monocytes: 11 %
Neutrophils Absolute: 7 x10E3/uL (ref 1.4–7.0)
Neutrophils: 73 %
Platelets: 339 x10E3/uL (ref 150–450)
RBC: 4.52 x10E6/uL (ref 3.77–5.28)
RDW: 12.7 % (ref 11.7–15.4)
WBC: 9.8 x10E3/uL (ref 3.4–10.8)

## 2023-04-08 LAB — CMP14+EGFR
ALT: 16 IU/L (ref 0–32)
AST: 19 IU/L (ref 0–40)
Albumin: 4.2 g/dL (ref 3.8–4.9)
Alkaline Phosphatase: 137 IU/L — ABNORMAL HIGH (ref 44–121)
BUN/Creatinine Ratio: 19 (ref 9–23)
BUN: 16 mg/dL (ref 6–24)
Bilirubin Total: <0.2 mg/dL (ref 0.0–1.2)
CO2: 26 mmol/L (ref 20–29)
Calcium: 9.6 mg/dL (ref 8.7–10.2)
Chloride: 103 mmol/L (ref 96–106)
Creatinine, Ser: 0.83 mg/dL (ref 0.57–1.00)
Globulin, Total: 2.5 g/dL (ref 1.5–4.5)
Glucose: 81 mg/dL (ref 70–99)
Potassium: 4.7 mmol/L (ref 3.5–5.2)
Sodium: 142 mmol/L (ref 134–144)
Total Protein: 6.7 g/dL (ref 6.0–8.5)
eGFR: 81 mL/min/1.73

## 2023-04-12 ENCOUNTER — Ambulatory Visit
Admission: RE | Admit: 2023-04-12 | Discharge: 2023-04-12 | Disposition: A | Discharge status: Home / Self Care | Payer: 59 | Source: Ambulatory Visit | Attending: Nurse Practitioner | Admitting: Nurse Practitioner

## 2023-04-12 DIAGNOSIS — Z0000 Encounter for general adult medical examination without abnormal findings: Secondary | ICD-10-CM

## 2023-04-12 DIAGNOSIS — F419 Anxiety disorder, unspecified: Secondary | ICD-10-CM

## 2023-04-12 DIAGNOSIS — I2510 Atherosclerotic heart disease of native coronary artery without angina pectoris: Secondary | ICD-10-CM

## 2023-04-12 DIAGNOSIS — R202 Paresthesia of skin: Secondary | ICD-10-CM

## 2023-04-12 DIAGNOSIS — E782 Mixed hyperlipidemia: Secondary | ICD-10-CM

## 2023-04-12 DIAGNOSIS — E039 Hypothyroidism, unspecified: Secondary | ICD-10-CM

## 2023-04-12 DIAGNOSIS — G8929 Other chronic pain: Secondary | ICD-10-CM

## 2023-04-13 ENCOUNTER — Encounter: Payer: Self-pay | Admitting: Nurse Practitioner

## 2023-04-19 ENCOUNTER — Encounter: Payer: Self-pay | Admitting: Nurse Practitioner

## 2023-04-19 MED ORDER — LEVOTHYROXINE SODIUM 100 MCG PO TABS: ORAL_TABLET | ORAL | 3 refills | Status: AC

## 2023-04-20 ENCOUNTER — Other Ambulatory Visit: Payer: Self-pay

## 2023-04-20 ENCOUNTER — Encounter: Payer: Self-pay | Admitting: Nurse Practitioner

## 2023-04-20 DIAGNOSIS — M5416 Radiculopathy, lumbar region: Secondary | ICD-10-CM

## 2023-04-20 DIAGNOSIS — R202 Paresthesia of skin: Secondary | ICD-10-CM

## 2023-05-06 ENCOUNTER — Ambulatory Visit (INDEPENDENT_AMBULATORY_CARE_PROVIDER_SITE_OTHER): Payer: 59 | Admitting: Orthopaedic Surgery

## 2023-05-06 DIAGNOSIS — Z981 Arthrodesis status: Secondary | ICD-10-CM

## 2023-05-06 NOTE — Progress Notes (Signed)
Office Visit Note   Patient: Laura Burnett           Date of Birth: February 20, 1966           MRN: [id number] Visit Date: 05/06/2023              Requested by: Tollie Eth, NP [address] [place],  [place] [address] PCP: Tollie Eth, NP   Assessment & Plan: Visit Diagnoses:  1. History of lumbar spinal fusion     Plan: Will set up for some physical therapy lumbar spine post fusion for core strengthening evaluation and treatment.  She can follow-up with me in 7 weeks.  Last imaging studies 04/12/2023 lumbar spine showed endplate change position of hardware with 1 screw extending through the superior endplate.  Previous medial malposition screw placement remains improved since her revision surgery.  Fusion appears solid.  Follow-Up Instructions: Return in about 7 weeks (around 06/24/2023).   Orders:  No orders of the defined types were placed in this encounter.  No orders of the defined types were placed in this encounter.     Procedures: No procedures performed   Clinical Data: No additional findings.   Subjective: No chief complaint on file.   HPI 57 year old female with back and right leg pain.  Patient's had x-rays at the ER in [place].  She denies associated bowel or bladder symptoms.  Additional history of adjustment disorder with depression moods.  Previous L5-S1 fusion by Dr. Bevely Palmer with revision by me for malposition screws and pseudoarthrosis.  She is some persistent weakness in her leg and some numbness unchanged.  Additionally she has had carpal tunnel release on the right and also trigger finger release 2021.  Review of Systems all systems noncontributory to HPI.   Objective: Vital Signs: There were no vitals taken for this visit.  Physical Exam Constitutional:      Appearance: She is well-developed.  HENT:     Head: Normocephalic.     Right Ear: External ear normal.     Left Ear: External ear normal. There is no impacted cerumen.  Eyes:      Pupils: Pupils are equal, round, and reactive to light.  Neck:     Thyroid: No thyromegaly.     Trachea: No tracheal deviation.  Cardiovascular:     Rate and Rhythm: Normal rate.  Pulmonary:     Effort: Pulmonary effort is normal.  Abdominal:     Palpations: Abdomen is soft.  Musculoskeletal:     Cervical back: No rigidity.  Skin:    General: Skin is warm and dry.  Neurological:     Mental Status: She is alert and oriented to person, place, and time.  Psychiatric:        Behavior: Behavior normal.     Ortho Exam lumbar incisions well-healed.  On discomfort straight leg raising.  Anterior tib is strong some tenderness over the medial bunion.  Good cervical range of motion.  Normal ankle dorsiflexion plantarflexion.  Specialty Comments:  No specialty comments available.  Imaging: No results found.   PMFS History: Patient Active Problem List   Diagnosis Date Noted   Dental infection 02/10/2023   Pain, joint, hand, left 08/25/2022   Right-sided face pain 04/30/2022   Low ferritin 03/18/2022   Seborrheic dermatitis of scalp 10/27/2021   Gluten free diet 06/16/2021   Encounter for annual wellness visit (AWV) in Medicare patient 06/16/2021   Arthralgia 06/16/2021   History of lumbar spinal fusion 05/22/2021  Hypothyroidism 04/17/2021   Nasal congestion 12/04/2020   CAD (coronary artery disease) 07/16/2020   OSA (obstructive sleep apnea) 07/02/2020   Bronchiectasis without complication (HCC) 06/04/2020   Seasonal allergies 01/26/2020   Lichen simplex chronicus 01/15/2020   Trigger finger, right index finger 11/17/2019   Hyperlipidemia 11/15/2019   Carpal tunnel syndrome, right upper limb 12/16/2018   Thyromegaly 10/18/2018   Adjustment disorder with depressed mood 05/24/2017   Anxiety disorder 03/16/2017   Neck pain 12/20/2015   Past Medical History:  Diagnosis Date   Abdominal pain, acute, epigastric 07/21/2019   Abscess 01/03/2022   Allergy    seasonal  allergies/animals   Anxiety    Arthritis    Asthma    as a child " exercise induced"   Bronchitis    Constipation    Depression    DJD (degenerative joint disease)    Encounter for medical examination to establish care 06/16/2021   Fibromyalgia    GERD (gastroesophageal reflux disease)    Headache    Hypothyroidism    Obesity    Perioral dermatitis 03/12/2021   PONV (postoperative nausea and vomiting)    Wears glasses     Family History  Problem Relation Age of Onset   Alzheimer's disease Mother    Diabetes Mother    Heart attack Father    Colon cancer Father    Heart failure Father    Thyroid disease Sister    Thyroid disease Brother    Stomach cancer Brother    Cancer Brother    Cancer Brother    ADD / ADHD Other    Esophageal cancer Neg Hx    Rectal cancer Neg Hx     Past Surgical History:  Procedure Laterality Date   ABDOMINAL HYSTERECTOMY     ADENOIDECTOMY     APPLICATION OF ROBOTIC ASSISTANCE FOR SPINAL PROCEDURE N/A 12/21/2016   Procedure: APPLICATION OF ROBOTIC ASSISTANCE FOR SPINAL PROCEDURE;  Surgeon: Loura Halt Ditty, MD;  [facility]: [facility];  Service: Neurosurgery;  Laterality: N/A;   BACK SURGERY     L5-S1   CARPAL TUNNEL RELEASE Right 01/16/2019   Procedure: RIGHT CARPAL TUNNEL RELEASE;  Surgeon: Eldred Manges, MD;  Location: [facility];  Service: Orthopedics;  Laterality: Right;   COLONOSCOPY     CYST EXCISION     uterine ;subsequent sx followed   FOOT SURGERY     HAND SURGERY     from AA   KNEE ARTHROSCOPY     x2   TONSILLECTOMY     TRIGGER FINGER RELEASE Right 01/16/2019   Procedure: RIGHT TRIGGER THUMB RELEASE;  Surgeon: Eldred Manges, MD;  Location: [facility];  Service: Orthopedics;  Laterality: Right;   TRIGGER FINGER RELEASE Right 12/04/2019   Procedure: right index trigger finger release;  Surgeon: Eldred Manges, MD;  Location:  SURGERY CENTER;  Service: Orthopedics;  Laterality: Right;   UPPER  GASTROINTESTINAL ENDOSCOPY     Social History   Occupational History   Occupation: Disabled   Tobacco Use   Smoking status: Former   Smokeless tobacco: Never   Tobacco comments:    stopped smoking cigarettes at age 59  Vaping Use   Vaping status: Never Used  Substance and Sexual Activity   Alcohol use: Not Currently   Drug use: No   Sexual activity: Not Currently
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
## 2023-05-19 ENCOUNTER — Telehealth (INDEPENDENT_AMBULATORY_CARE_PROVIDER_SITE_OTHER): Payer: 59 | Admitting: Psychiatry

## 2023-05-19 ENCOUNTER — Encounter: Payer: Self-pay | Admitting: Psychiatry

## 2023-05-19 DIAGNOSIS — G4700 Insomnia, unspecified: Secondary | ICD-10-CM

## 2023-05-19 DIAGNOSIS — F411 Generalized anxiety disorder: Secondary | ICD-10-CM | POA: Diagnosis not present

## 2023-05-19 DIAGNOSIS — F3341 Major depressive disorder, recurrent, in partial remission: Secondary | ICD-10-CM | POA: Diagnosis not present

## 2023-05-19 DIAGNOSIS — G2581 Restless legs syndrome: Secondary | ICD-10-CM | POA: Diagnosis not present

## 2023-05-19 NOTE — Patient Instructions (Signed)
Continue duloxetine 120 mg at night Continue bupropion 450 mg daily  Obtain lab - iron panel Next appointment: 9/18 at 9 AM

## 2023-05-20 ENCOUNTER — Encounter: Payer: Self-pay | Admitting: Psychiatry

## 2023-05-20 LAB — IRON,TIBC AND FERRITIN PANEL
Ferritin: 23 ng/mL (ref 15–150)
Iron Saturation: 28 % (ref 15–55)
Iron: 94 ug/dL (ref 27–159)
Total Iron Binding Capacity: 332 ug/dL (ref 250–450)
UIBC: 238 ug/dL (ref 131–425)

## 2023-05-20 NOTE — Progress Notes (Signed)
Could you the patient? Her ferritin level is low, although she does not have anemia. Please advise her to discuss this with her primary care provider for possible treatment, especially considering her restless leg symptoms. Thanks.

## 2023-05-20 NOTE — Progress Notes (Signed)
Called patient as instructed by provider to discuss lab results, no answer left voicemail for patient to return call to the office

## 2023-05-21 ENCOUNTER — Other Ambulatory Visit: Payer: Self-pay | Admitting: Psychiatry

## 2023-05-21 NOTE — Progress Notes (Signed)
Patient return call  to office informed patient of labs results and advised patient to discuss this with her PCP patient voiced understanding

## 2023-05-24 ENCOUNTER — Other Ambulatory Visit: Payer: Self-pay

## 2023-05-24 DIAGNOSIS — Z981 Arthrodesis status: Secondary | ICD-10-CM

## 2023-05-25 ENCOUNTER — Other Ambulatory Visit: Payer: Self-pay | Admitting: Psychiatry

## 2023-05-25 DIAGNOSIS — F411 Generalized anxiety disorder: Secondary | ICD-10-CM

## 2023-05-27 NOTE — Progress Notes (Signed)
Called pt to see if she got an appt with her pcp yet.  She states that she been to busy to follow up with her because she going to physical therapy about her back. But she does have a note to herself to call and make an appt

## 2023-06-03 ENCOUNTER — Ambulatory Visit (INDEPENDENT_AMBULATORY_CARE_PROVIDER_SITE_OTHER): Payer: 59 | Admitting: Orthopaedic Surgery

## 2023-06-03 DIAGNOSIS — Z981 Arthrodesis status: Secondary | ICD-10-CM

## 2023-06-03 NOTE — Progress Notes (Signed)
Office Visit Note   Patient: Laura Burnett           Date of Birth: [birth year]           MRN: [id number] Visit Date: 06/03/2023              Requested by: Tollie Eth, NP [address] [place],  [place] [address] PCP: Tollie Eth, NP   Assessment & Plan: Visit Diagnoses:  1. History of lumbar spinal fusion     Plan: Patient has gotten some relief with therapy.  Reviewed previous imaging study she did not have significant degenerative changes but had to have some mild changes at L2-3, L3-4 and also L4-5.  To continue with.  Therapy exercises and I plan to recheck her in 3 months.  Follow-Up Instructions: No follow-ups on file.   Orders:  No orders of the defined types were placed in this encounter.  No orders of the defined types were placed in this encounter.     Procedures: No procedures performed   Clinical Data: No additional findings.   Subjective: Chief Complaint  Patient presents with   Lower Back - Pain, Follow-up    Has been doing therapy she says helps, but still gets a knot on th R side of back and has sharp pain that goes from calf to foot on the right side as well.     HPI 57 year old female previous lumbar fusion revision done by me with previous surgery done by Dr. Bevely Palmer with pseudoarthrosis and 1 screw malposition.  She has some numbness in 1 leg on the right.  She has some discomfort on the right side states the therapy she is doing is helping her.  She has worked on gradually losing some weight which she thinks is helping her back.  Review of Systems all other systems updated unchanged from previous visit.  Previous carpal tunnel index finger trigger finger release, revision for pseudoarthrosis L5-S1 2019.   Objective: Vital Signs: There were no vitals taken for this visit.  Physical Exam Constitutional:      Appearance: She is well-developed.  HENT:     Head: Normocephalic.     Right Ear: External ear normal.     Left Ear: External  ear normal. There is no impacted cerumen.  Eyes:     Pupils: Pupils are equal, round, and reactive to light.  Neck:     Thyroid: No thyromegaly.     Trachea: No tracheal deviation.  Cardiovascular:     Rate and Rhythm: Normal rate.  Pulmonary:     Effort: Pulmonary effort is normal.  Abdominal:     Palpations: Abdomen is soft.  Musculoskeletal:     Cervical back: No rigidity.  Skin:    General: Skin is warm and dry.  Neurological:     Mental Status: She is alert and oriented to person, place, and time.  Psychiatric:        Behavior: Behavior normal.     Ortho Exam negative sciatic notch tenderness negative straight leg raising 90 degrees lumbar incision is well-healed.  She is able to heel and toe walk.  Specialty Comments:  No specialty comments available.  Imaging: No results found.   PMFS History: Patient Active Problem List   Diagnosis Date Noted   Dental infection 02/10/2023   Pain, joint, hand, left 08/25/2022   Right-sided face pain 04/30/2022   Low ferritin 03/18/2022   Seborrheic dermatitis of scalp 10/27/2021   Gluten free diet  06/16/2021   Encounter for annual wellness visit (AWV) in Medicare patient 06/16/2021   Arthralgia 06/16/2021   History of lumbar spinal fusion 05/22/2021   Hypothyroidism 04/17/2021   Nasal congestion 12/04/2020   CAD (coronary artery disease) 07/16/2020   OSA (obstructive sleep apnea) 07/02/2020   Bronchiectasis without complication (HCC) 06/04/2020   Seasonal allergies 01/26/2020   Lichen simplex chronicus 01/15/2020   Trigger finger, right index finger 11/17/2019   Hyperlipidemia 11/15/2019   Carpal tunnel syndrome, right upper limb 12/16/2018   Thyromegaly 10/18/2018   Adjustment disorder with depressed mood 05/24/2017   Anxiety disorder 03/16/2017   Neck pain 12/20/2015   Past Medical History:  Diagnosis Date   Abdominal pain, acute, epigastric 07/21/2019   Abscess 01/03/2022   Allergy    seasonal allergies/animals    Anxiety    Arthritis    Asthma    as a child " exercise induced"   Bronchitis    Constipation    Depression    DJD (degenerative joint disease)    Encounter for medical examination to establish care 06/16/2021   Fibromyalgia    GERD (gastroesophageal reflux disease)    Headache    Hypothyroidism    Obesity    Perioral dermatitis 03/12/2021   PONV (postoperative nausea and vomiting)    Wears glasses     Family History  Problem Relation Age of Onset   Alzheimer's disease Mother    Diabetes Mother    Heart attack Father    Colon cancer Father    Heart failure Father    Thyroid disease Sister    Thyroid disease Brother    Stomach cancer Brother    Cancer Brother    Cancer Brother    ADD / ADHD Other    Esophageal cancer Neg Hx    Rectal cancer Neg Hx     Past Surgical History:  Procedure Laterality Date   ABDOMINAL HYSTERECTOMY     ADENOIDECTOMY     APPLICATION OF ROBOTIC ASSISTANCE FOR SPINAL PROCEDURE N/A 12/21/2016   Procedure: APPLICATION OF ROBOTIC ASSISTANCE FOR SPINAL PROCEDURE;  Surgeon: Loura Halt Ditty, MD;  Location: [facility];  Service: Neurosurgery;  Laterality: N/A;   BACK SURGERY     L5-S1   CARPAL TUNNEL RELEASE Right 01/16/2019   Procedure: RIGHT CARPAL TUNNEL RELEASE;  Surgeon: Eldred Manges, MD;  Location: [facility];  Service: Orthopedics;  Laterality: Right;   COLONOSCOPY     CYST EXCISION     uterine ;subsequent sx followed   FOOT SURGERY     HAND SURGERY     from AA   KNEE ARTHROSCOPY     x2   TONSILLECTOMY     TRIGGER FINGER RELEASE Right 01/16/2019   Procedure: RIGHT TRIGGER THUMB RELEASE;  Surgeon: Eldred Manges, MD;  Location: [facility];  Service: Orthopedics;  Laterality: Right;   TRIGGER FINGER RELEASE Right 12/04/2019   Procedure: right index trigger finger release;  Surgeon: Eldred Manges, MD;  Location: [facility];  Service: Orthopedics;  Laterality: Right;   UPPER GASTROINTESTINAL  ENDOSCOPY     Social History   Occupational History   Occupation: Disabled   Tobacco Use   Smoking status: Former   Smokeless tobacco: Never   Tobacco comments:    stopped smoking cigarettes at age 64  Vaping Use   Vaping status: Never Used  Substance and Sexual Activity   Alcohol use: Not Currently   Drug use: No  Sexual activity: Not Currently

## 2023-06-07 ENCOUNTER — Telehealth: Payer: Self-pay

## 2023-06-07 NOTE — Telephone Encounter (Signed)
Pt lost last bottle of gabapentin. Requesting a refill be sent to [facility] on file.

## 2023-06-08 ENCOUNTER — Other Ambulatory Visit: Payer: Self-pay

## 2023-06-08 DIAGNOSIS — R519 Headache, unspecified: Secondary | ICD-10-CM

## 2023-06-08 MED ORDER — GABAPENTIN 800 MG PO TABS: 800.0000 mg | ORAL_TABLET | Freq: Three times a day (TID) | ORAL | 0 refills | Status: AC

## 2023-06-15 ENCOUNTER — Other Ambulatory Visit: Payer: Self-pay

## 2023-06-15 DIAGNOSIS — E039 Hypothyroidism, unspecified: Secondary | ICD-10-CM

## 2023-06-16 LAB — TSH: TSH: 4.15 u[IU]/mL (ref 0.450–4.500)

## 2023-06-21 ENCOUNTER — Encounter: Payer: Self-pay | Admitting: Nurse Practitioner

## 2023-06-21 ENCOUNTER — Telehealth (INDEPENDENT_AMBULATORY_CARE_PROVIDER_SITE_OTHER): Payer: 59 | Admitting: Nurse Practitioner

## 2023-06-21 VITALS — Wt 185.0 lb

## 2023-06-21 DIAGNOSIS — E039 Hypothyroidism, unspecified: Secondary | ICD-10-CM | POA: Diagnosis not present

## 2023-06-21 DIAGNOSIS — K5900 Constipation, unspecified: Secondary | ICD-10-CM

## 2023-06-21 DIAGNOSIS — R748 Abnormal levels of other serum enzymes: Secondary | ICD-10-CM | POA: Diagnosis not present

## 2023-06-21 DIAGNOSIS — R790 Abnormal level of blood mineral: Secondary | ICD-10-CM

## 2023-06-21 NOTE — Progress Notes (Signed)
Virtual Visit Encounter mychart visit.   I connected with  Laura Burnett on 06/29/23 at  1:30 PM EDT by secure video and audio telemedicine application. I verified that I am speaking with the correct person using two identifiers.   I introduced myself as a Publishing rights manager with the practice. The limitations of evaluation and management by telemedicine discussed with the patient and the availability of in person appointments. The patient expressed verbal understanding and consent to proceed.  Participating parties in this visit include: Myself and patient  The patient is: Patient Location: Home I am: Provider Location: Office/Clinic Subjective:    CC and HPI: Laura Burnett is a 57 y.o. year old female presenting for new evaluation and treatment of fatigue-thyroid/iron concerns. .  The patient, with a history of thyroid issues and low iron levels, presents with ongoing fatigue and constipation. She reports that her energy levels have not improved despite adjustments to her thyroid medication. She also mentions that she has been experiencing constipation, which she believes may be related to her thyroid condition. The patient has been trying to manage her constipation with over-the-counter stool softeners, which she reports as being somewhat effective.  In addition to her thyroid and constipation issues, the patient has been dealing with low iron levels. She reports that she has been trying to increase her iron intake through dietary changes, including consuming iron-rich foods and taking iron supplements. Despite these efforts, she still feels that her energy levels are not where they should be.  The patient also mentions that she has been taking biotin, which she believes may be interfering with her thyroid tests. She reports that she stopped taking biotin a few days before her most recent thyroid test.  Past medical history, Surgical history, Family history not pertinant except as noted below,  Social history, Allergies, and medications have been entered into the medical record, reviewed, and corrections made.   Review of Systems:  All review of systems negative except what is listed in the HPI  Objective:    Alert and oriented x 4 Speaking in clear sentences with no shortness of breath. No distress.  Impression and Recommendations:    Problem List Items Addressed This Visit     Elevated alkaline phosphatase level    Levels have been high but are improving. No evidence of liver or kidney disease. Possible link to thyroid function or Vitamin D levels. -Continue to monitor levels.      Hypothyroidism - Primary    Fluctuating TSH levels with recent increase to 4.1. Patient reports ongoing fatigue and sleep disturbances. -Increase Levothyroxine to four days a week and three days a week. -Recheck thyroid levels in a couple of months.      Low ferritin    Ferritin levels on the lower end at 23, but iron levels within normal range. Patient has been consuming iron-rich foods and reports constipation with iron supplements. -Continue dietary management. -Consider taking iron supplement one day a week if tolerated.      Constipation    Likely related to hypothyroidism and iron supplementation. Patient currently using over-the-counter stool softener with some relief. -Continue stool softener as needed. -Monitor for improvement with adjustment in thyroid medication.      4 days a week, 3 days a week.  Ferrous sulfate  325mg  one day a week.    orders and follow up as documented in EMR I discussed the assessment and treatment plan with the patient. The patient  was provided an opportunity to ask questions and all were answered. The patient agreed with the plan and demonstrated an understanding of the instructions.   The patient was advised to call back or seek an in-person evaluation if the symptoms worsen or if the condition fails to improve as  anticipated.  Follow-Up: 3-6 months  I provided 28 minutes of non-face-to-face interaction with this non face-to-face encounter including intake, same-day documentation, and chart review.   Tollie Eth, NP , DNP, AGNP-c [facility]

## 2023-06-23 ENCOUNTER — Encounter: Payer: Self-pay | Admitting: Nurse Practitioner

## 2023-06-28 ENCOUNTER — Other Ambulatory Visit: Payer: Self-pay | Admitting: Nurse Practitioner

## 2023-06-28 DIAGNOSIS — M5416 Radiculopathy, lumbar region: Secondary | ICD-10-CM

## 2023-06-28 DIAGNOSIS — M542 Cervicalgia: Secondary | ICD-10-CM

## 2023-06-28 DIAGNOSIS — M65321 Trigger finger, right index finger: Secondary | ICD-10-CM

## 2023-06-28 DIAGNOSIS — G5601 Carpal tunnel syndrome, right upper limb: Secondary | ICD-10-CM

## 2023-06-28 NOTE — Telephone Encounter (Signed)
Last filled 04/04/23

## 2023-06-29 DIAGNOSIS — K5900 Constipation, unspecified: Secondary | ICD-10-CM | POA: Insufficient documentation

## 2023-06-29 NOTE — Assessment & Plan Note (Signed)
Likely related to hypothyroidism and iron supplementation. Patient currently using over-the-counter stool softener with some relief. -Continue stool softener as needed. -Monitor for improvement with adjustment in thyroid medication.

## 2023-06-29 NOTE — Assessment & Plan Note (Signed)
Ferritin levels on the lower end at 23, but iron levels within normal range. Patient has been consuming iron-rich foods and reports constipation with iron supplements. -Continue dietary management. -Consider taking iron supplement one day a week if tolerated.

## 2023-06-29 NOTE — Assessment & Plan Note (Signed)
Fluctuating TSH levels with recent increase to 4.1. Patient reports ongoing fatigue and sleep disturbances. -Increase Levothyroxine to four days a week and three days a week. -Recheck thyroid levels in a couple of months.

## 2023-06-29 NOTE — Assessment & Plan Note (Signed)
Levels have been high but are improving. No evidence of liver or kidney disease. Possible link to thyroid function or Vitamin D levels. -Continue to monitor levels.

## 2023-07-07 ENCOUNTER — Other Ambulatory Visit: Payer: Self-pay

## 2023-07-07 ENCOUNTER — Emergency Department (HOSPITAL_COMMUNITY)
Admission: EM | Admit: 2023-07-07 | Discharge: 2023-07-07 | Disposition: A | Discharge status: Home / Self Care | Payer: 59

## 2023-07-07 ENCOUNTER — Encounter (HOSPITAL_COMMUNITY): Payer: Self-pay | Admitting: Radiology

## 2023-07-07 ENCOUNTER — Emergency Department (HOSPITAL_COMMUNITY): Payer: 59

## 2023-07-07 DIAGNOSIS — K2900 Acute gastritis without bleeding: Secondary | ICD-10-CM | POA: Insufficient documentation

## 2023-07-07 DIAGNOSIS — R1013 Epigastric pain: Secondary | ICD-10-CM | POA: Diagnosis present

## 2023-07-07 LAB — CBC
HCT: 41.2 % (ref 36.0–46.0)
Hemoglobin: 13.5 g/dL (ref 12.0–15.0)
MCH: 30.7 pg (ref 26.0–34.0)
MCHC: 32.8 g/dL (ref 30.0–36.0)
MCV: 93.6 fL (ref 80.0–100.0)
Platelets: 350 10*3/uL (ref 150–400)
RBC: 4.4 MIL/uL (ref 3.87–5.11)
RDW: 13.9 % (ref 11.5–15.5)
WBC: 7.7 10*3/uL (ref 4.0–10.5)
nRBC: 0 % (ref 0.0–0.2)

## 2023-07-07 LAB — COMPREHENSIVE METABOLIC PANEL
ALT: 16 U/L (ref 0–44)
AST: 21 U/L (ref 15–41)
Albumin: 3.8 g/dL (ref 3.5–5.0)
Alkaline Phosphatase: 125 U/L (ref 38–126)
Anion gap: 10 (ref 5–15)
BUN: 9 mg/dL (ref 6–20)
CO2: 23 mmol/L (ref 22–32)
Calcium: 9 mg/dL (ref 8.9–10.3)
Chloride: 104 mmol/L (ref 98–111)
Creatinine, Ser: 0.85 mg/dL (ref 0.44–1.00)
GFR, Estimated: >60 mL/min (ref >60)
Glucose, Bld: 90 mg/dL (ref 70–99)
Potassium: 3.8 mmol/L (ref 3.5–5.1)
Sodium: 137 mmol/L (ref 135–145)
Total Bilirubin: 0.5 mg/dL (ref <1.2)
Total Protein: 7.3 g/dL (ref 6.5–8.1)

## 2023-07-07 LAB — LIPASE, BLOOD: Lipase: 29 U/L (ref 11–51)

## 2023-07-07 MED ORDER — FAMOTIDINE 20 MG PO TABS
20.0000 mg | ORAL_TABLET | Freq: Once | ORAL | Status: AC
  Administered 2023-07-07: 20 mg via ORAL
  Filled 2023-07-07: qty 1

## 2023-07-07 MED ORDER — ALUM & MAG HYDROXIDE-SIMETH 200-200-20 MG/5ML PO SUSP
30.0000 mL | Freq: Once | ORAL | Status: AC
  Administered 2023-07-07: 30 mL via ORAL
  Filled 2023-07-07: qty 30

## 2023-07-07 MED ORDER — SUCRALFATE 1 G PO TABS
1.0000 g | ORAL_TABLET | Freq: Once | ORAL | Status: AC
  Administered 2023-07-07: 1 g via ORAL
  Filled 2023-07-07: qty 1

## 2023-07-07 MED ORDER — SUCRALFATE 1 G PO TABS: 1.0000 g | ORAL_TABLET | Freq: Three times a day (TID) | ORAL | 0 refills | Status: DC

## 2023-07-07 NOTE — ED Triage Notes (Signed)
Pt states for the past 4 days she has had intermittent pain that starts under her sternum going under her right breast wrapping around to her back. Pt with CVA tenderness on the right side. Denies kidney or urinary symptoms. Pt endorses N/V.

## 2023-07-07 NOTE — Discharge Instructions (Addendum)
Please follow-up with your primary doctor.  Return immediately for fevers, chills, worsening abdominal pain, inability due to nausea vomiting.  You see blood in your urine or you stop urinating.  You may also return if you develop any new or worsening symptoms that are concerning to you.

## 2023-07-07 NOTE — ED Provider Notes (Signed)
[place] EMERGENCY DEPARTMENT AT [facility] Provider Note   CSN: [id number] Arrival date & time: 07/07/23  1202     History  Chief Complaint  Patient presents with   Abdominal Pain    DARCY BARBARA is a 57 y.o. female.  57 year old female present emergency department with 4 days of epigastric/right upper quadrant pain.  Worsens with food.  No nausea no vomiting.  Normal bowel movements, had a normal bowel movement yesterday.  Still passing gas.  No fevers chills runny nose sore throat cough.  Reports prior hysterectomy, but no other surgeries.  Does endorse taking some Motrin pretty regularly for symptom flank pain.   Abdominal Pain      Home Medications Prior to Admission medications   Medication Sig Start Date End Date Taking? Authorizing Provider  sucralfate (CARAFATE) 1 g tablet Take 1 tablet (1 g total) by mouth with breakfast, with lunch, and with evening meal for 14 days. 07/07/23 07/21/23 Yes Chavy Avera, Feliz Beam J, DO  albuterol (VENTOLIN HFA) 108 (90 Base) MCG/ACT inhaler INHALE 2 PUFFS BY MOUTH EVERY 4 HOURS AS NEEDED FOR WHEEZING OR SHORTNESS OF BREATH 02/24/23   Early, Sung Amabile, NP  atorvastatin (LIPITOR) 40 MG tablet Take 1 tablet (40 mg total) by mouth daily. 12/17/22   Tollie Eth, NP  buPROPion (WELLBUTRIN XL) 150 MG 24 hr tablet Take 3 tablets (450 mg total) by mouth daily. 02/22/23 08/21/23  [name], MD  cyclobenzaprine (FLEXERIL) 10 MG tablet 1 TABLET EVERY 8-12 HOURS AS NEEDED FOR SEVERE MUSCLE SPASMS 06/29/23   Early, Sung Amabile, NP  DULoxetine (CYMBALTA) 60 MG capsule Take 2 capsules (120 mg total) by mouth at bedtime. 02/22/23 08/21/23  [name], MD  gabapentin (NEURONTIN) 800 MG tablet Take 1 tablet (800 mg total) by mouth 3 (three) times daily. 06/08/23   Tollie Eth, NP  ibuprofen (ADVIL) 800 MG tablet Take 1 tablet (800 mg total) by mouth every 8 (eight) hours as needed. 02/01/23   Tollie Eth, NP  levothyroxine (SYNTHROID) 100 MCG tablet 1 tab  ( )  by mouth 6 days a week. Take 1.5 tab ( ) by mouth 1 day a week. Take on empty stomach with no other medications, wait 30 minutes before eating. 04/19/23   Tollie Eth, NP  montelukast (SINGULAIR) 10 MG tablet Take 1 tablet (10 mg total) by mouth at bedtime. 01/01/23   Tollie Eth, NP  omeprazole (PRILOSEC) 40 MG capsule Take 1 capsule (40 mg total) by mouth 2 (two) times daily. 12/17/22   Tollie Eth, NP  ondansetron (ZOFRAN-ODT) 4 MG disintegrating tablet Take 1 tablet (4 mg total) by mouth every 8 (eight) hours as needed for nausea or vomiting. 04/06/23   Tollie Eth, NP  predniSONE (DELTASONE) 20 MG tablet Take 1 tablet (20 mg total) by mouth daily with breakfast. Patient not taking: Reported on 06/21/2023 04/06/23   Early, Sung Amabile, NP      Allergies    Ciprofloxacin, Phentermine, Tape, Bactrim [sulfamethoxazole-trimethoprim], Percocet [oxycodone-acetaminophen], Tetanus toxoids, and Tylenol with codeine #3 [acetaminophen-codeine]    Review of Systems   Review of Systems  Gastrointestinal:  Positive for abdominal pain.    Physical Exam Updated Vital Signs BP 107/72   Pulse 78   Temp 98.5 F (36.9 C) (Oral)   Resp 13   Ht 5\' 2"  (1.575 m)   Wt 83.9 kg   SpO2 96%   BMI 33.84 kg/m  Physical Exam Vitals and nursing  note reviewed.  Constitutional:      General: She is not in acute distress.    Appearance: She is not toxic-appearing.  HENT:     Head: Normocephalic.  Cardiovascular:     Rate and Rhythm: Normal rate and regular rhythm.  Pulmonary:     Effort: Pulmonary effort is normal.  Abdominal:     General: Abdomen is flat.     Palpations: Abdomen is soft.     Tenderness: There is abdominal tenderness (mild) in the right upper quadrant and epigastric area.  Skin:    General: Skin is warm and dry.  Neurological:     General: No focal deficit present.     Mental Status: She is alert.  Psychiatric:        Mood and Affect: Mood normal.        Behavior:  Behavior normal.     ED Results / Procedures / Treatments   Labs (all labs ordered are listed, but only abnormal results are displayed) Labs Reviewed  LIPASE, BLOOD  COMPREHENSIVE METABOLIC PANEL  CBC  URINALYSIS, ROUTINE W REFLEX MICROSCOPIC    EKG EKG Interpretation Date/Time:  Wednesday July 07 2023 12:24:25 EST Ventricular Rate:  100 PR Interval:  142 QRS Duration:  90 QT Interval:  338 QTC Calculation: 436 R Axis:   74  Text Interpretation: Normal sinus rhythm Normal ECG When compared with ECG of 09-Nov-2017 17:49, No significant change was found Confirmed by Estanislado Pandy [phone]) on 07/07/2023 4:27:06 PM  Radiology US Abdomen Limited  Result Date: 07/07/2023 CLINICAL DATA:  Right upper quadrant pain EXAM: ULTRASOUND ABDOMEN LIMITED RIGHT UPPER QUADRANT COMPARISON:  None Available. FINDINGS: Gallbladder: No gallstones or wall thickening visualized. No sonographic Murphy sign noted by sonographer. Common bile duct: Diameter: 3 mm Liver: No focal lesion identified. Increased parenchymal echogenicity. Portal vein is patent on color Doppler imaging with normal direction of blood flow towards the liver. Other: None. IMPRESSION: 1. Normal sonographic appearance of the gallbladder. 2. Hepatic steatosis. Electronically Signed   By: Allegra Lai M.D.   On: 07/07/2023 16:19    Procedures Procedures    Medications Ordered in ED Medications  alum & mag hydroxide-simeth (MAALOX/MYLANTA) 200-200-20 MG/5ML suspension 30 mL (30 mLs Oral Given 07/07/23 1709)  famotidine (PEPCID) tablet 20 mg (20 mg Oral Given 07/07/23 1709)  sucralfate (CARAFATE) tablet 1 g (1 g Oral Given 07/07/23 1715)    ED Course/ Medical Decision Making/ A&P Clinical Course as of 07/07/23 2321  Wed Jul 07, 2023  1625 Per chart review had telehealth visit 10/21 with following diagnosis: " Elevated alkaline phosphatase level       Levels have been high but are improving. No evidence of liver or kidney  disease. Possible link to thyroid function or Vitamin D levels. -Continue to monitor levels.      Hypothyroidism - Primary      Fluctuating TSH levels with recent increase to 4.1. Patient reports ongoing fatigue and sleep disturbances. -Increase Levothyroxine to four days a week and three days a week. -Recheck thyroid levels in a couple of months.      Low ferritin      Ferritin levels on the lower end at 23, but iron levels within normal range. Patient has been consuming iron-rich foods and reports constipation with iron supplements. -Continue dietary management. -Consider taking iron supplement one day a week if tolerated.      Constipation      Likely related to hypothyroidism and iron  supplementation. Patient currently using over-the-counter stool softener with some relief. -Continue stool softener as needed. -Monitor for improvement with adjustment in thyroid medication." [TY]  1626 US Abdomen Limited MPRESSION: 1. Normal sonographic appearance of the gallbladder. 2. Hepatic steatosis.   [TY]  1902 Patient reports feeling improvement after significant GI cocktail and Pepcid. Tolerating PO. Awaiting urine results.  However patient states she does not want to stay.  Discussed that I would not know if her symptoms are secondary to infectious process.  However she still does not want to wait.  She states she will follow-up with her primary doctor.  Will discharge for likely gastritis. [TY]    Clinical Course User Index [TY] Coral Spikes, DO                                 Medical Decision Making Is a 57 year old female presenting emergency department for epigastric abdominal pain.  She is afebrile nontachycardic minimally stable.  Physical exam with some minor tenderness in the right upper quadrant and epigastrium.  Question gastritis versus ulcer as she has been taking most recently 8 AM symptoms seemingly worsened with food.  Labs today reassuring.  No fever  tachycardia leukocytosis to suggest systemic infection.  Comprehensive metabolic panel with no significant metabolic derangements that would suggest dehydration.  Normal kidney function.  No transaminitis to suggest hepatobiliary disease.  Lipase is normal.  Pancreatitis is unlikely.  UA pending.  Ultrasound without acute gallbladder pathology.  Supportive medications; GI cocktail, Pepcid and sucrafate.  Will make sure patient tolerates p.o. and will discharge with outpatient follow up if she does.   Amount and/or Complexity of Data Reviewed Labs: ordered. Radiology:  Decision-making details documented in ED Course.  Risk OTC drugs. Prescription drug management.          Final Clinical Impression(s) / ED Diagnoses Final diagnoses:  Acute gastritis without hemorrhage, unspecified gastritis type    Rx / DC Orders ED Discharge Orders          Ordered    sucralfate (CARAFATE) 1 g tablet  3 times daily with meals        07/07/23 1905              Coral Spikes, DO 07/07/23 2321

## 2023-07-07 NOTE — ED Provider Triage Note (Signed)
Emergency Medicine Provider Triage Evaluation Note  Laura Burnett , a 57 y.o. female  was evaluated in triage.  Pt complains of gastric and right upper quadrant pain for the past several days worse with eating.  Radiates into the back on the right upper side  Review of Systems  Positive: Right upper back pain, abdominal pain, nausea Negative: fever  Physical Exam  BP (!) 134/94 (BP Location: Right Arm)   Pulse 100   Temp 98.5 F (36.9 C) (Oral)   Resp 16   Ht 5\' 2"  (1.575 m)   Wt 83.9 kg   SpO2 95%   BMI 33.84 kg/m  Gen:   Awake, no distress   Resp:  Normal effort  MSK:   Moves extremities without difficulty  Other:  RUQ tenderness  Medical Decision Making  Medically screening exam initiated at 1:12 PM.  Appropriate orders placed.  [name] was informed that the remainder of the evaluation will be completed by another provider, this initial triage assessment does not replace that evaluation, and the importance of remaining in the ED until their evaluation is complete.     Ma Rings, [place] 07/07/23 1314

## 2023-07-13 ENCOUNTER — Other Ambulatory Visit: Payer: Self-pay | Admitting: Nurse Practitioner

## 2023-07-13 DIAGNOSIS — E782 Mixed hyperlipidemia: Secondary | ICD-10-CM

## 2023-07-13 DIAGNOSIS — I2510 Atherosclerotic heart disease of native coronary artery without angina pectoris: Secondary | ICD-10-CM

## 2023-07-15 ENCOUNTER — Other Ambulatory Visit: Payer: Self-pay | Admitting: Nurse Practitioner

## 2023-07-15 DIAGNOSIS — R1013 Epigastric pain: Secondary | ICD-10-CM
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
## 2023-07-21 ENCOUNTER — Encounter: Payer: Self-pay | Admitting: Psychiatry

## 2023-07-21 ENCOUNTER — Telehealth (INDEPENDENT_AMBULATORY_CARE_PROVIDER_SITE_OTHER): Payer: 59 | Admitting: Psychiatry

## 2023-07-21 DIAGNOSIS — F411 Generalized anxiety disorder: Secondary | ICD-10-CM

## 2023-07-21 DIAGNOSIS — G2581 Restless legs syndrome: Secondary | ICD-10-CM | POA: Diagnosis not present

## 2023-07-21 DIAGNOSIS — G4700 Insomnia, unspecified: Secondary | ICD-10-CM | POA: Diagnosis not present

## 2023-07-21 DIAGNOSIS — F3341 Major depressive disorder, recurrent, in partial remission: Secondary | ICD-10-CM

## 2023-07-21 MED ORDER — BUPROPION HCL ER (XL) 150 MG PO TB24: 450.0000 mg | ORAL_TABLET | Freq: Every day | ORAL | 1 refills | Status: DC

## 2023-07-21 MED ORDER — DULOXETINE HCL 60 MG PO CPEP: 120.0000 mg | ORAL_CAPSULE | Freq: Every day | ORAL | 1 refills | Status: DC

## 2023-07-21 NOTE — Patient Instructions (Signed)
Continue duloxetine 120 mg at night  Continue bupropion 450 mg daily  Next appointment: 1/22 at 9 AM

## 2023-07-27 ENCOUNTER — Encounter (INDEPENDENT_AMBULATORY_CARE_PROVIDER_SITE_OTHER): Payer: Self-pay | Admitting: *Deleted

## 2023-08-18 DIAGNOSIS — R109 Unspecified abdominal pain: Secondary | ICD-10-CM | POA: Diagnosis not present

## 2023-08-18 DIAGNOSIS — N390 Urinary tract infection, site not specified: Secondary | ICD-10-CM | POA: Diagnosis not present

## 2023-08-18 DIAGNOSIS — Z299 Encounter for prophylactic measures, unspecified: Secondary | ICD-10-CM | POA: Diagnosis not present

## 2023-08-18 DIAGNOSIS — K5909 Other constipation: Secondary | ICD-10-CM | POA: Diagnosis not present

## 2023-08-18 DIAGNOSIS — R52 Pain, unspecified: Secondary | ICD-10-CM | POA: Diagnosis not present

## 2023-08-18 DIAGNOSIS — M797 Fibromyalgia: Secondary | ICD-10-CM | POA: Diagnosis not present

## 2023-08-24 ENCOUNTER — Other Ambulatory Visit: Payer: Self-pay | Admitting: Nurse Practitioner

## 2023-08-24 DIAGNOSIS — J324 Chronic pansinusitis: Secondary | ICD-10-CM

## 2023-08-26 ENCOUNTER — Ambulatory Visit (INDEPENDENT_AMBULATORY_CARE_PROVIDER_SITE_OTHER): Payer: 59 | Admitting: Gastroenterology

## 2023-08-26 ENCOUNTER — Other Ambulatory Visit (INDEPENDENT_AMBULATORY_CARE_PROVIDER_SITE_OTHER): Payer: Self-pay | Admitting: Gastroenterology

## 2023-08-26 VITALS — BP 107/76 | HR 98 | Temp 98.2°F | Ht 62.0 in | Wt 188.7 lb

## 2023-08-26 DIAGNOSIS — R1310 Dysphagia, unspecified: Secondary | ICD-10-CM

## 2023-08-26 DIAGNOSIS — K5904 Chronic idiopathic constipation: Secondary | ICD-10-CM

## 2023-08-26 DIAGNOSIS — R1319 Other dysphagia: Secondary | ICD-10-CM | POA: Insufficient documentation

## 2023-08-26 DIAGNOSIS — E611 Iron deficiency: Secondary | ICD-10-CM

## 2023-08-26 DIAGNOSIS — K5909 Other constipation: Secondary | ICD-10-CM

## 2023-08-26 DIAGNOSIS — R1011 Right upper quadrant pain: Secondary | ICD-10-CM | POA: Insufficient documentation

## 2023-08-26 MED ORDER — PSYLLIUM 58.6 % PO PACK: 1.0000 | PACK | Freq: Two times a day (BID) | ORAL | 2 refills | Status: AC

## 2023-08-26 MED ORDER — POLYETHYLENE GLYCOL 3350 17 G PO PACK: 17.0000 g | PACK | Freq: Two times a day (BID) | ORAL | 0 refills | Status: AC

## 2023-08-26 NOTE — Progress Notes (Signed)
Laura Burnett , M.D. Gastroenterology & Hepatology [facility] [address] [place], [place] [address] Primary Care Physician: Golden Pop, FNP [address] [place] [address]  Chief Complaint: Abdominal pain , Dysphagia , New onset Iron deficiency   History of Present Illness: Laura Burnett is a 57 y.o. female with depression chronic back pain  who presents for evaluation of Abdominal pain , Dysphagia , New onset Iron deficiency   Patient reports right upper quadrant abdominal pain which is mostly postprandial has been present for past few months.  Patient reports this pain is worsened with fatty and spicy food.  Patient went to the ER any had an ultrasound.  Patient reports new onset intermittent solid food dysphagia.  Patient takes intermittently NSAIDs because of chronic back pain.  Patient recently started Linzess but is only taking it sometimes during the week.  She has chronic constipation with Bristol stool scale 1-2 bowel movement every 1 to 2 days.  Patient does take PPI before meals twice daily. The patient denies having any nausea, vomiting, fever, chills, hematochezia, melena, hematemesis, diarrhea, jaundice, pruritus or weight loss.  Last GEX:5284  - Mild inflammation characterized by erythema, friability and granularity was found in the gastric antrum. Biopsies were taken with a cold forceps for histology. - The exam was otherwise without abnormality.  Last Colonoscopy:January 2021 Dr. Christella Hartigan found a 3 mm tubular adenoma, diverticulosis.  Recommended repeat colonoscopy at 5-year interval   FHx: Father was diagnosed with colon cancer in his 61s.  Social: neg smoking, alcohol or illicit drug use Surgical: Hysterectomy  Past Medical History: Past Medical History:  Diagnosis Date   Abdominal pain, acute, epigastric 07/21/2019   Abscess 01/03/2022   Allergy    seasonal allergies/animals   Anxiety    Arthritis    Asthma     as a child " exercise induced"   Bronchitis    Constipation    Depression    DJD (degenerative joint disease)    Encounter for medical examination to establish care 06/16/2021   Fibromyalgia    GERD (gastroesophageal reflux disease)    Headache    Hypothyroidism    Obesity    Perioral dermatitis 03/12/2021   PONV (postoperative nausea and vomiting)    Wears glasses     Past Surgical History: Past Surgical History:  Procedure Laterality Date   ABDOMINAL HYSTERECTOMY     ADENOIDECTOMY     APPLICATION OF ROBOTIC ASSISTANCE FOR SPINAL PROCEDURE N/A 12/21/2016   Procedure: APPLICATION OF ROBOTIC ASSISTANCE FOR SPINAL PROCEDURE;  Surgeon: Loura Halt Ditty, MD;  [facility];  Service: Neurosurgery;  Laterality: N/A;   BACK SURGERY     L5-S1   CARPAL TUNNEL RELEASE Right 01/16/2019   Procedure: RIGHT CARPAL TUNNEL RELEASE;  Surgeon: Eldred Manges, MD;  Location: [facility];  Service: Orthopedics;  Laterality: Right;   COLONOSCOPY     CYST EXCISION     uterine ;subsequent sx followed   FOOT SURGERY     HAND SURGERY     from AA   KNEE ARTHROSCOPY     x2   TONSILLECTOMY     TRIGGER FINGER RELEASE Right 01/16/2019   Procedure: RIGHT TRIGGER THUMB RELEASE;  Surgeon: Eldred Manges, MD;  Location: [facility];  Service: Orthopedics;  Laterality: Right;   TRIGGER FINGER RELEASE Right 12/04/2019   Procedure: right index trigger finger release;  Surgeon: Eldred Manges, MD;  Location: [facility];  Service: Orthopedics;  Laterality: Right;   UPPER GASTROINTESTINAL ENDOSCOPY      Family History: Family History  Problem Relation Age of Onset   Alzheimer's disease Mother    Diabetes Mother    Heart attack Father    Colon cancer Father    Heart failure Father    Thyroid disease Sister    Thyroid disease Brother    Stomach cancer Brother    Cancer Brother    Cancer Brother    ADD / ADHD Other    Esophageal cancer Neg Hx    Rectal  cancer Neg Hx     Social History: Social History   Tobacco Use  Smoking Status Former  Smokeless Tobacco Never  Tobacco Comments   stopped smoking cigarettes at age 64   Social History   Substance and Sexual Activity  Alcohol Use Not Currently   Social History   Substance and Sexual Activity  Drug Use No    Allergies: Allergies  Allergen Reactions   Phentermine Other (See Comments)    Tearing, lightheadedness, depression   Tape Other (See Comments)    " I get red and itchy." Paper tape okay   Bactrim [Sulfamethoxazole-Trimethoprim] Swelling and Rash   Percocet [Oxycodone-Acetaminophen] Hives and Rash   Tetanus Toxoids Nausea And Vomiting   Tylenol With Codeine #3 [Acetaminophen-Codeine] Rash    Medications: Current Outpatient Medications  Medication Sig Dispense Refill   albuterol (VENTOLIN HFA) 108 (90 Base) MCG/ACT inhaler INHALE 2 PUFFS BY MOUTH EVERY 4 HOURS AS NEEDED FOR WHEEZING OR SHORTNESS OF BREATH 8.5 g 1   atorvastatin (LIPITOR) 40 MG tablet TAKE 1 TABLET BY MOUTH DAILY 90 tablet 1   buPROPion (WELLBUTRIN XL) 150 MG 24 hr tablet Take 3 tablets (450 mg total) by mouth daily. 270 tablet 1   cyclobenzaprine (FLEXERIL) 10 MG tablet 1 TABLET EVERY 8-12 HOURS AS NEEDED FOR SEVERE MUSCLE SPASMS 270 tablet 1   DULoxetine (CYMBALTA) 60 MG capsule Take 2 capsules (120 mg total) by mouth at bedtime. 180 capsule 1   gabapentin (NEURONTIN) 800 MG tablet Take 1 tablet (800 mg total) by mouth 3 (three) times daily. 270 tablet 0   ibuprofen (ADVIL) 800 MG tablet Take 1 tablet (800 mg total) by mouth every 8 (eight) hours as needed. 60 tablet 3   levothyroxine (SYNTHROID) 100 MCG tablet 1 tab ( )  by mouth 6 days a week. Take 1.5 tab ( ) by mouth 1 day a week. Take on empty stomach with no other medications, wait 30 minutes before eating. 90 tablet 3   montelukast (SINGULAIR) 10 MG tablet TAKE 1 TABLET BY MOUTH AT  BEDTIME 90 tablet 1   omeprazole (PRILOSEC) 40 MG  capsule TAKE 1 CAPSULE BY MOUTH TWICE DAILY 180 capsule 1   ondansetron (ZOFRAN-ODT) 4 MG disintegrating tablet Take 1 tablet (4 mg total) by mouth every 8 (eight) hours as needed for nausea or vomiting. 30 tablet 3   polyethylene glycol (MIRALAX / GLYCOLAX) 17 g packet Take 17 g by mouth 2 (two) times daily. 180 packet 0   psyllium (METAMUCIL) 58.6 % packet Take 1 packet by mouth 2 (two) times daily. 60 packet 2   No current facility-administered medications for this visit.    Review of Systems: GENERAL: negative for malaise, night sweats HEENT: No changes in hearing or vision, no nose bleeds or other nasal problems. NECK: Negative for lumps, goiter, pain and significant neck swelling RESPIRATORY: Negative for cough,  wheezing CARDIOVASCULAR: Negative for chest pain, leg swelling, palpitations, orthopnea GI: SEE HPI MUSCULOSKELETAL: Negative for joint pain or swelling, back pain, and muscle pain. SKIN: Negative for lesions, rash HEMATOLOGY Negative for prolonged bleeding, bruising easily, and swollen nodes. ENDOCRINE: Negative for cold or heat intolerance, polyuria, polydipsia and goiter. NEURO: negative for tremor, gait imbalance, syncope and seizures. The remainder of the review of systems is noncontributory.   Physical Exam: BP 107/76 (BP Location: Right Arm, Patient Position: Sitting)   Pulse 98   Temp 98.2 F (36.8 C) (Oral)   Ht 5\' 2"  (1.575 m)   Wt 188 lb 11.2 oz (85.6 kg)   BMI 34.51 kg/m  GENERAL: The patient is AO x3, in no acute distress. HEENT: Head is normocephalic and atraumatic. EOMI are intact. Mouth is well hydrated and without lesions. NECK: Supple. No masses LUNGS: Clear to auscultation. No presence of rhonchi/wheezing/rales. Adequate chest expansion HEART: RRR, normal s1 and s2. ABDOMEN: Soft, nontender, no guarding, no peritoneal signs, and nondistended. BS +. No masses.   Imaging/Labs: as above     Latest Ref Rng & Units 07/07/2023    1:31 PM 04/07/2023    11:51 AM 08/03/2022    3:37 PM  CBC  WBC 4.0 - 10.5 K/uL 7.7  9.8  5.7   Hemoglobin 12.0 - 15.0 g/dL 82.9  56.2  13.0   Hematocrit 36.0 - 46.0 % 41.2  41.2  42.1   Platelets 150 - 400 K/uL 350  339  335    Lab Results  Component Value Date   IRON 94 05/19/2023   TIBC 332 05/19/2023   FERRITIN 23 05/19/2023    I personally reviewed and interpreted the available labs, imaging and endoscopic files.  Ultrasound :07/2023 IMPRESSION: 1. Normal sonographic appearance of the gallbladder. 2. Hepatic steatosis.  Ct abdomen and pelvis 2021 IMPRESSION: Colonic diverticulosis. No radiographic evidence of diverticulitis or other acute findings.   HBG : 13.5  Normal liver enzymes  Ferritin 23 iron sat 28  Impression and Plan:  Laura Burnett is a 57 y.o. female with depression chronic back pain  who presents for evaluation of Abdominal pain , Dysphagia , New onset Iron deficiency   #Abdominal pain  #Dysphagia  It appears patient has chronic abdominal pain as she was seen by GI in 2021 where upper endoscopy colonoscopy and CT abdomen pelvis without contrast was unremarkable.  Abdominal pain is right upper quadrant and mostly postprandial which is typical of biliary colic.  Although ultrasound did not demonstrate any cholelithiasis.  Given chronicity of pain patient may benefit from surgical evaluation for symptomatic gallbladder disease  Although new onset solid food dysphagia with iron deficiency without anemia discussed patient with upper endoscopy to rule out peptic ulcer disease or chronic sequela of GERD such as stricture  Continue taking PPI before meal For diagnostic upper endoscopy  #Iron deficiency   Ferritin 23 hemoglobin 13.5.  Patient has new onset iron deficiency without any anemia at this time.  Is up-to-date to colonoscopy last performed 2021 suggest repeat 5 years given family history of colon cancer  Will obtain celiac serologies and plan for small bowel biopsies  during upper endoscopy.  Also will send CRP fecal calprotectin  Will continue to monitor ferritin in the next few months and if iron deficiency worsens may plan for early colonoscopy  #Chronic constipation  Appears patient has chronic constipation was recently started on Linzess by PCP but patient is only attributing to taking.  Advised to  take Linzess daily and will uptitrate the dose depending on response  Ensure adequate fluid intake: Aim for 8 glasses of water daily. Follow a high fiber diet: Include foods such as dates, prunes, pears, and kiwi. Take Miralax twice a day for the first week, then reduce to once daily thereafter. Use Metamucil twice a day.  RTC 1 month after EGD   All questions were answered.      Laura Lawman, MD Gastroenterology and Hepatology [facility]   This chart has been completed using [facility] Dictation software, and while attempts have been made to ensure accuracy , certain words and phrases may not be transcribed as intended

## 2023-08-26 NOTE — Patient Instructions (Signed)
It was very nice to meet you today, as dicussed with will plan for the following :  1) Upper endoscopy  2) Ensure adequate fluid intake: Aim for 8 glasses of water daily. Follow a high fiber diet: Include foods such as dates, prunes, pears, and kiwi. Take Miralax twice a day for the first week, then reduce to once daily thereafter. Use Metamucil twice a day.  3) referral to the surgeon to assess pain from gallbladder  4) blood work

## 2023-08-26 NOTE — H&P (View-Only) (Signed)
 Laura Burnett , M.D. Gastroenterology & Hepatology [facility] [address] [place], [place] [address] Primary Care Physician: Golden Pop, FNP [address] [place] [address]  Chief Complaint: Abdominal pain , Dysphagia , New onset Iron deficiency   History of Present Illness: Laura Burnett is a 57 y.o. female with depression chronic back pain  who presents for evaluation of Abdominal pain , Dysphagia , New onset Iron deficiency   Patient reports right upper quadrant abdominal pain which is mostly postprandial has been present for past few months.  Patient reports this pain is worsened with fatty and spicy food.  Patient went to the ER any had an ultrasound.  Patient reports new onset intermittent solid food dysphagia.  Patient takes intermittently NSAIDs because of chronic back pain.  Patient recently started Linzess but is only taking it sometimes during the week.  She has chronic constipation with Bristol stool scale 1-2 bowel movement every 1 to 2 days.  Patient does take PPI before meals twice daily. The patient denies having any nausea, vomiting, fever, chills, hematochezia, melena, hematemesis, diarrhea, jaundice, pruritus or weight loss.  Last GEX:5284  - Mild inflammation characterized by erythema, friability and granularity was found in the gastric antrum. Biopsies were taken with a cold forceps for histology. - The exam was otherwise without abnormality.  Last Colonoscopy:January 2021 Dr. Christella Hartigan found a 3 mm tubular adenoma, diverticulosis.  Recommended repeat colonoscopy at 5-year interval   FHx: Father was diagnosed with colon cancer in his 61s.  Social: neg smoking, alcohol or illicit drug use Surgical: Hysterectomy  Past Medical History: Past Medical History:  Diagnosis Date   Abdominal pain, acute, epigastric 07/21/2019   Abscess 01/03/2022   Allergy    seasonal allergies/animals   Anxiety    Arthritis    Asthma     as a child " exercise induced"   Bronchitis    Constipation    Depression    DJD (degenerative joint disease)    Encounter for medical examination to establish care 06/16/2021   Fibromyalgia    GERD (gastroesophageal reflux disease)    Headache    Hypothyroidism    Obesity    Perioral dermatitis 03/12/2021   PONV (postoperative nausea and vomiting)    Wears glasses     Past Surgical History: Past Surgical History:  Procedure Laterality Date   ABDOMINAL HYSTERECTOMY     ADENOIDECTOMY     APPLICATION OF ROBOTIC ASSISTANCE FOR SPINAL PROCEDURE N/A 12/21/2016   Procedure: APPLICATION OF ROBOTIC ASSISTANCE FOR SPINAL PROCEDURE;  Surgeon: Loura Halt Ditty, MD;  Location: [facility];  Service: Neurosurgery;  Laterality: N/A;   BACK SURGERY     L5-S1   CARPAL TUNNEL RELEASE Right 01/16/2019   Procedure: RIGHT CARPAL TUNNEL RELEASE;  Surgeon: Eldred Manges, MD;  Location: [facility];  Service: Orthopedics;  Laterality: Right;   COLONOSCOPY     CYST EXCISION     uterine ;subsequent sx followed   FOOT SURGERY     HAND SURGERY     from AA   KNEE ARTHROSCOPY     x2   TONSILLECTOMY     TRIGGER FINGER RELEASE Right 01/16/2019   Procedure: RIGHT TRIGGER THUMB RELEASE;  Surgeon: Eldred Manges, MD;  Location: [facility];  Service: Orthopedics;  Laterality: Right;   TRIGGER FINGER RELEASE Right 12/04/2019   Procedure: right index trigger finger release;  Surgeon: Eldred Manges, MD;  Location: [facility];  Service: Orthopedics;  Laterality: Right;   UPPER GASTROINTESTINAL ENDOSCOPY      Family History: Family History  Problem Relation Age of Onset   Alzheimer's disease Mother    Diabetes Mother    Heart attack Father    Colon cancer Father    Heart failure Father    Thyroid disease Sister    Thyroid disease Brother    Stomach cancer Brother    Cancer Brother    Cancer Brother    ADD / ADHD Other    Esophageal cancer Neg Hx    Rectal  cancer Neg Hx     Social History: Social History   Tobacco Use  Smoking Status Former  Smokeless Tobacco Never  Tobacco Comments   stopped smoking cigarettes at age 64   Social History   Substance and Sexual Activity  Alcohol Use Not Currently   Social History   Substance and Sexual Activity  Drug Use No    Allergies: Allergies  Allergen Reactions   Phentermine Other (See Comments)    Tearing, lightheadedness, depression   Tape Other (See Comments)    " I get red and itchy." Paper tape okay   Bactrim [Sulfamethoxazole-Trimethoprim] Swelling and Rash   Percocet [Oxycodone-Acetaminophen] Hives and Rash   Tetanus Toxoids Nausea And Vomiting   Tylenol With Codeine #3 [Acetaminophen-Codeine] Rash    Medications: Current Outpatient Medications  Medication Sig Dispense Refill   albuterol (VENTOLIN HFA) 108 (90 Base) MCG/ACT inhaler INHALE 2 PUFFS BY MOUTH EVERY 4 HOURS AS NEEDED FOR WHEEZING OR SHORTNESS OF BREATH 8.5 g 1   atorvastatin (LIPITOR) 40 MG tablet TAKE 1 TABLET BY MOUTH DAILY 90 tablet 1   buPROPion (WELLBUTRIN XL) 150 MG 24 hr tablet Take 3 tablets (450 mg total) by mouth daily. 270 tablet 1   cyclobenzaprine (FLEXERIL) 10 MG tablet 1 TABLET EVERY 8-12 HOURS AS NEEDED FOR SEVERE MUSCLE SPASMS 270 tablet 1   DULoxetine (CYMBALTA) 60 MG capsule Take 2 capsules (120 mg total) by mouth at bedtime. 180 capsule 1   gabapentin (NEURONTIN) 800 MG tablet Take 1 tablet (800 mg total) by mouth 3 (three) times daily. 270 tablet 0   ibuprofen (ADVIL) 800 MG tablet Take 1 tablet (800 mg total) by mouth every 8 (eight) hours as needed. 60 tablet 3   levothyroxine (SYNTHROID) 100 MCG tablet 1 tab ( )  by mouth 6 days a week. Take 1.5 tab ( ) by mouth 1 day a week. Take on empty stomach with no other medications, wait 30 minutes before eating. 90 tablet 3   montelukast (SINGULAIR) 10 MG tablet TAKE 1 TABLET BY MOUTH AT  BEDTIME 90 tablet 1   omeprazole (PRILOSEC) 40 MG  capsule TAKE 1 CAPSULE BY MOUTH TWICE DAILY 180 capsule 1   ondansetron (ZOFRAN-ODT) 4 MG disintegrating tablet Take 1 tablet (4 mg total) by mouth every 8 (eight) hours as needed for nausea or vomiting. 30 tablet 3   polyethylene glycol (MIRALAX / GLYCOLAX) 17 g packet Take 17 g by mouth 2 (two) times daily. 180 packet 0   psyllium (METAMUCIL) 58.6 % packet Take 1 packet by mouth 2 (two) times daily. 60 packet 2   No current facility-administered medications for this visit.    Review of Systems: GENERAL: negative for malaise, night sweats HEENT: No changes in hearing or vision, no nose bleeds or other nasal problems. NECK: Negative for lumps, goiter, pain and significant neck swelling RESPIRATORY: Negative for cough,  wheezing CARDIOVASCULAR: Negative for chest pain, leg swelling, palpitations, orthopnea GI: SEE HPI MUSCULOSKELETAL: Negative for joint pain or swelling, back pain, and muscle pain. SKIN: Negative for lesions, rash HEMATOLOGY Negative for prolonged bleeding, bruising easily, and swollen nodes. ENDOCRINE: Negative for cold or heat intolerance, polyuria, polydipsia and goiter. NEURO: negative for tremor, gait imbalance, syncope and seizures. The remainder of the review of systems is noncontributory.   Physical Exam: BP 107/76 (BP Location: Right Arm, Patient Position: Sitting)   Pulse 98   Temp 98.2 F (36.8 C) (Oral)   Ht 5\' 2"  (1.575 m)   Wt 188 lb 11.2 oz (85.6 kg)   BMI 34.51 kg/m  GENERAL: The patient is AO x3, in no acute distress. HEENT: Head is normocephalic and atraumatic. EOMI are intact. Mouth is well hydrated and without lesions. NECK: Supple. No masses LUNGS: Clear to auscultation. No presence of rhonchi/wheezing/rales. Adequate chest expansion HEART: RRR, normal s1 and s2. ABDOMEN: Soft, nontender, no guarding, no peritoneal signs, and nondistended. BS +. No masses.   Imaging/Labs: as above     Latest Ref Rng & Units 07/07/2023    1:31 PM 04/07/2023    11:51 AM 08/03/2022    3:37 PM  CBC  WBC 4.0 - 10.5 K/uL 7.7  9.8  5.7   Hemoglobin 12.0 - 15.0 g/dL 82.9  56.2  13.0   Hematocrit 36.0 - 46.0 % 41.2  41.2  42.1   Platelets 150 - 400 K/uL 350  339  335    Lab Results  Component Value Date   IRON 94 05/19/2023   TIBC 332 05/19/2023   FERRITIN 23 05/19/2023    I personally reviewed and interpreted the available labs, imaging and endoscopic files.  Ultrasound :07/2023 IMPRESSION: 1. Normal sonographic appearance of the gallbladder. 2. Hepatic steatosis.  Ct abdomen and pelvis 2021 IMPRESSION: Colonic diverticulosis. No radiographic evidence of diverticulitis or other acute findings.   HBG : 13.5  Normal liver enzymes  Ferritin 23 iron sat 28  Impression and Plan:  Laura Burnett is a 57 y.o. female with depression chronic back pain  who presents for evaluation of Abdominal pain , Dysphagia , New onset Iron deficiency   #Abdominal pain  #Dysphagia  It appears patient has chronic abdominal pain as she was seen by GI in 2021 where upper endoscopy colonoscopy and CT abdomen pelvis without contrast was unremarkable.  Abdominal pain is right upper quadrant and mostly postprandial which is typical of biliary colic.  Although ultrasound did not demonstrate any cholelithiasis.  Given chronicity of pain patient may benefit from surgical evaluation for symptomatic gallbladder disease  Although new onset solid food dysphagia with iron deficiency without anemia discussed patient with upper endoscopy to rule out peptic ulcer disease or chronic sequela of GERD such as stricture  Continue taking PPI before meal For diagnostic upper endoscopy  #Iron deficiency   Ferritin 23 hemoglobin 13.5.  Patient has new onset iron deficiency without any anemia at this time.  Is up-to-date to colonoscopy last performed 2021 suggest repeat 5 years given family history of colon cancer  Will obtain celiac serologies and plan for small bowel biopsies  during upper endoscopy.  Also will send CRP fecal calprotectin  Will continue to monitor ferritin in the next few months and if iron deficiency worsens may plan for early colonoscopy  #Chronic constipation  Appears patient has chronic constipation was recently started on Linzess by PCP but patient is only attributing to taking.  Advised to  take Linzess daily and will uptitrate the dose depending on response  Ensure adequate fluid intake: Aim for 8 glasses of water daily. Follow a high fiber diet: Include foods such as dates, prunes, pears, and kiwi. Take Miralax twice a day for the first week, then reduce to once daily thereafter. Use Metamucil twice a day.  RTC 1 month after EGD   All questions were answered.      Laura Lawman, MD Gastroenterology and Hepatology [facility]   This chart has been completed using [facility] Dictation software, and while attempts have been made to ensure accuracy , certain words and phrases may not be transcribed as intended

## 2023-08-28 LAB — CELIAC AB TTG DGP TIGA
Antigliadin Abs, IgA: 4 U (ref 0–19)
Gliadin IgG: 9 U (ref 0–19)
IgA/Immunoglobulin A, Serum: 130 mg/dL (ref 87–352)
Tissue Transglut Ab: 5 U/mL (ref 0–5)

## 2023-08-28 LAB — C-REACTIVE PROTEIN: CRP: 2 mg/L (ref 0–10)

## 2023-08-31 ENCOUNTER — Ambulatory Visit (INDEPENDENT_AMBULATORY_CARE_PROVIDER_SITE_OTHER): Payer: 59 | Admitting: Gastroenterology

## 2023-08-31 LAB — CALPROTECTIN, FECAL: Calprotectin, Fecal: 95 ug/g (ref 0–120)

## 2023-08-31 NOTE — Progress Notes (Signed)
Hi Laura Burnett ,  Can you please call the patient and tell the patient the lab work was negative for celiac disease and inflammation.  This is good news  Thanks,  Vista Lawman, MD Gastroenterology and Hepatology [facility]

## 2023-09-02 ENCOUNTER — Other Ambulatory Visit: Payer: Self-pay | Admitting: Nurse Practitioner

## 2023-09-02 ENCOUNTER — Ambulatory Visit (INDEPENDENT_AMBULATORY_CARE_PROVIDER_SITE_OTHER): Payer: Medicare HMO | Admitting: Orthopaedic Surgery

## 2023-09-02 ENCOUNTER — Other Ambulatory Visit (INDEPENDENT_AMBULATORY_CARE_PROVIDER_SITE_OTHER): Payer: Medicare HMO

## 2023-09-02 ENCOUNTER — Telehealth (INDEPENDENT_AMBULATORY_CARE_PROVIDER_SITE_OTHER): Payer: Self-pay | Admitting: Gastroenterology

## 2023-09-02 ENCOUNTER — Encounter: Payer: Self-pay | Admitting: Orthopaedic Surgery

## 2023-09-02 VITALS — Ht 62.0 in | Wt 185.0 lb

## 2023-09-02 DIAGNOSIS — M25561 Pain in right knee: Secondary | ICD-10-CM

## 2023-09-02 DIAGNOSIS — G8929 Other chronic pain: Secondary | ICD-10-CM

## 2023-09-02 DIAGNOSIS — R519 Headache, unspecified: Secondary | ICD-10-CM

## 2023-09-02 MED ORDER — METHYLPREDNISOLONE ACETATE 40 MG/ML IJ SUSP
40.0000 mg | INTRAMUSCULAR | Status: AC | PRN
  Administered 2023-09-02: 40 mg via INTRA_ARTICULAR

## 2023-09-02 MED ORDER — BUPIVACAINE HCL 0.25 % IJ SOLN
4.0000 mL | INTRAMUSCULAR | Status: AC | PRN
  Administered 2023-09-02: 4 mL via INTRA_ARTICULAR

## 2023-09-02 MED ORDER — LIDOCAINE HCL 1 % IJ SOLN
0.5000 mL | INTRAMUSCULAR | Status: AC | PRN
  Administered 2023-09-02: .5 mL

## 2023-09-02 NOTE — Telephone Encounter (Signed)
 No pa needed via [employer]

## 2023-09-02 NOTE — Progress Notes (Signed)
 Office Visit Note   Patient: Laura Burnett           Date of Birth: [birth year]           MRN: [id number] Visit Date: 09/02/2023              Requested by: Leavy Waddell NOVAK, FNP [address] [place],  [place] [address] PCP: Leavy Waddell NOVAK, FNP   Assessment & Plan: Visit Diagnoses:  1. Chronic pain of right knee     Plan: Knee injection performed right knee if she has persistent problems she will let us  know.  She does have chondromalacia patella and some medial joint line tenderness.  Likely had previous medial meniscectomy.  She will continue with walking and core strengthening exercises for her lumbar spine.  Follow-Up Instructions: No follow-ups on file.   Orders:  Orders Placed This Encounter  Procedures   XR KNEE 3 VIEW RIGHT   No orders of the defined types were placed in this encounter.     Procedures: Large Joint Inj: R knee on 09/02/2023 1:25 PM Indications: pain and joint swelling Details: 22 G 1.5 in needle, anterolateral approach  Arthrogram: No  Medications: 40 mg methylPREDNISolone  acetate 40 MG/ML; 0.5 mL lidocaine  1 %; 4 mL bupivacaine  0.25 % Outcome: tolerated well, no immediate complications Procedure, treatment alternatives, risks and benefits explained, specific risks discussed. Consent was given by the patient. Immediately prior to procedure a time out was called to verify the correct patient, procedure, equipment, support staff and site/side marked as required. Patient was prepped and draped in the usual sterile fashion.       Clinical Data: No additional findings.   Subjective: Chief Complaint  Patient presents with   Lower Back - Follow-up   Right Knee - Pain    HPI patient returns she had previous fusion revision L5-S1 for surgery by Dr. Sherree I revised her.  Back sometimes gives her some soreness sometimes over the right side more than left.  She uses a TENS unit at times.  Right knee is giving her increased problems she has had knee  arthroscopy x 2 in the past done elsewhere.  She has pain with squatting and steps and some pain medially.  Mild swelling with activities.  No locking symptoms.  No groin pain.  Review of Systems all systems updated unchanged.   Objective: Vital Signs: Ht 5' 2 (1.575 m)   Wt 185 lb (83.9 kg)   BMI 33.84 kg/m   Physical Exam Constitutional:      Appearance: She is well-developed.  HENT:     Head: Normocephalic.     Right Ear: External ear normal.     Left Ear: External ear normal. There is no impacted cerumen.  Eyes:     Pupils: Pupils are equal, round, and reactive to light.  Neck:     Thyroid : No thyromegaly.     Trachea: No tracheal deviation.  Cardiovascular:     Rate and Rhythm: Normal rate.  Pulmonary:     Effort: Pulmonary effort is normal.  Abdominal:     Palpations: Abdomen is soft.  Musculoskeletal:     Cervical back: No rigidity.  Skin:    General: Skin is warm and dry.  Neurological:     Mental Status: She is alert and oriented to person, place, and time.  Psychiatric:        Behavior: Behavior normal.     Ortho Exam medial joint line tenderness pain with patellofemoral  loading crepitus with knee extension.  No lateral patellar subluxation.  Negative logroll the hips negative straight leg raising 90 degrees.  Specialty Comments:  No specialty comments available.  Imaging: No results found.   PMFS History: Patient Active Problem List   Diagnosis Date Noted   Iron  deficiency 08/26/2023   Right upper quadrant abdominal pain 08/26/2023   Esophageal dysphagia 08/26/2023   Constipation 06/29/2023   Dental infection 02/10/2023   Pain, joint, hand, left 08/25/2022   Right-sided face pain 04/30/2022   Low ferritin 03/18/2022   Seborrheic dermatitis of scalp 10/27/2021   Gluten free diet 06/16/2021   Encounter for annual wellness visit (AWV) in Medicare patient 06/16/2021   Arthralgia 06/16/2021   History of lumbar spinal fusion 05/22/2021    Hypothyroidism 04/17/2021   Nasal congestion 12/04/2020   CAD (coronary artery disease) 07/16/2020   OSA (obstructive sleep apnea) 07/02/2020   Bronchiectasis without complication (HCC) 06/04/2020   Seasonal allergies 01/26/2020   Lichen simplex chronicus 01/15/2020   Trigger finger, right index finger 11/17/2019   Hyperlipidemia 11/15/2019   Elevated alkaline phosphatase level 10/12/2019   Carpal tunnel syndrome, right upper limb 12/16/2018   Thyromegaly 10/18/2018   Adjustment disorder with depressed mood 05/24/2017   Anxiety disorder 03/16/2017   Neck pain 12/20/2015   Past Medical History:  Diagnosis Date   Abdominal pain, acute, epigastric 07/21/2019   Abscess 01/03/2022   Allergy    seasonal allergies/animals   Anxiety    Arthritis    Asthma    as a child  exercise induced   Bronchitis    Constipation    Depression    DJD (degenerative joint disease)    Encounter for medical examination to establish care 06/16/2021   Fibromyalgia    GERD (gastroesophageal reflux disease)    Headache    Hypothyroidism    Obesity    Perioral dermatitis 03/12/2021   PONV (postoperative nausea and vomiting)    Wears glasses     Family History  Problem Relation Age of Onset   Alzheimer's disease Mother    Diabetes Mother    Heart attack Father    Colon cancer Father    Heart failure Father    Thyroid  disease Sister    Thyroid  disease Brother    Stomach cancer Brother    Cancer Brother    Cancer Brother    ADD / ADHD Other    Esophageal cancer Neg Hx    Rectal cancer Neg Hx     Past Surgical History:  Procedure Laterality Date   ABDOMINAL HYSTERECTOMY     ADENOIDECTOMY     APPLICATION OF ROBOTIC ASSISTANCE FOR SPINAL PROCEDURE N/A 12/21/2016   Procedure: APPLICATION OF ROBOTIC ASSISTANCE FOR SPINAL PROCEDURE;  Surgeon: Morene Hicks Ditty, MD;  Location: [facility];  Service: Neurosurgery;  Laterality: N/A;   BACK SURGERY     L5-S1   CARPAL TUNNEL RELEASE Right 01/16/2019    Procedure: RIGHT CARPAL TUNNEL RELEASE;  Surgeon: Barbarann Oneil BROCKS, MD;  Location: [facility];  Service: Orthopedics;  Laterality: Right;   COLONOSCOPY     CYST EXCISION     uterine ;subsequent sx followed   FOOT SURGERY     HAND SURGERY     from AA   KNEE ARTHROSCOPY     x2   TONSILLECTOMY     TRIGGER FINGER RELEASE Right 01/16/2019   Procedure: RIGHT TRIGGER THUMB RELEASE;  Surgeon: Barbarann Oneil BROCKS, MD;  Location:  SURGERY CENTER;  Service: Orthopedics;  Laterality: Right;   TRIGGER FINGER RELEASE Right 12/04/2019   Procedure: right index trigger finger release;  Surgeon: Barbarann Oneil BROCKS, MD;  Location: [facility];  Service: Orthopedics;  Laterality: Right;   UPPER GASTROINTESTINAL ENDOSCOPY     Social History   Occupational History   Occupation: Disabled   Tobacco Use   Smoking status: Former   Smokeless tobacco: Never   Tobacco comments:    stopped smoking cigarettes at age 46  Vaping Use   Vaping status: Never Used  Substance and Sexual Activity   Alcohol  use: Not Currently   Drug use: No   Sexual activity: Not Currently

## 2023-09-03 ENCOUNTER — Telehealth: Payer: Self-pay | Admitting: Radiology

## 2023-09-03 ENCOUNTER — Other Ambulatory Visit: Payer: Self-pay | Admitting: Nurse Practitioner

## 2023-09-03 DIAGNOSIS — R519 Headache, unspecified: Secondary | ICD-10-CM

## 2023-09-03 NOTE — Telephone Encounter (Signed)
 Last apt 06/21/23.

## 2023-09-03 NOTE — Telephone Encounter (Signed)
 FYI  I spoke with patient. She has a flushed face. She is status post knee injection yesterday. No other complaints. I advised Benadryl and cold compress to face.  She will call back to office if symptoms do not improve.

## 2023-09-03 NOTE — Telephone Encounter (Signed)
 Pt haas called stating she now has a rash and is wondering if it is coming from the injection.  She is wondering what she should do   Please call her at [phone]

## 2023-09-06 NOTE — Telephone Encounter (Signed)
 noted

## 2023-09-07 ENCOUNTER — Ambulatory Visit (INDEPENDENT_AMBULATORY_CARE_PROVIDER_SITE_OTHER): Payer: 59 | Admitting: Gastroenterology

## 2023-09-09 NOTE — Telephone Encounter (Signed)
 Last apt 06/21/23.

## 2023-09-16 ENCOUNTER — Encounter (INDEPENDENT_AMBULATORY_CARE_PROVIDER_SITE_OTHER): Payer: Self-pay

## 2023-09-16 ENCOUNTER — Encounter: Payer: Self-pay | Admitting: General Surgery

## 2023-09-16 ENCOUNTER — Telehealth (INDEPENDENT_AMBULATORY_CARE_PROVIDER_SITE_OTHER): Payer: Self-pay

## 2023-09-16 ENCOUNTER — Ambulatory Visit: Payer: Medicare HMO | Admitting: General Surgery

## 2023-09-16 VITALS — BP 93/68 | HR 95 | Temp 98.6°F | Resp 14 | Ht 62.0 in | Wt 192.0 lb

## 2023-09-16 DIAGNOSIS — K8050 Calculus of bile duct without cholangitis or cholecystitis without obstruction: Secondary | ICD-10-CM

## 2023-09-16 NOTE — Telephone Encounter (Signed)
Patient needing procedure instruction resent to her as she has lost them. I have re sent to her via My Chart and patient states she has the copy.

## 2023-09-16 NOTE — Progress Notes (Signed)
Laura Burnett; [id number]; Jun 09, 1966   HPI Patient is a 58 year old white female who was referred to my care by Dr. Tasia Catchings of gastroenterology for evaluation treatment of right upper quadrant abdominal pain.  She has multiple GI complaints including right upper quadrant abdominal pain, bloating, indigestion, and constipation.  She has had a pulmonary workup which has been negative for any specific source of her symptoms.  She does suffer from fibromyalgia.  She is scheduled undergo an EGD on 09/20/2023.  She did have an ultrasound which was negative for cholelithiasis.  She states the episodes occur almost daily in nature.  No specific trigger foods are noted.  She denies any fever, chills, or jaundice. Past Medical History:  Diagnosis Date   Abdominal pain, acute, epigastric 07/21/2019   Abscess 01/03/2022   Allergy    seasonal allergies/animals   Anxiety    Arthritis    Asthma    as a child " exercise induced"   Bronchitis    Constipation    Depression    DJD (degenerative joint disease)    Encounter for medical examination to establish care 06/16/2021   Fibromyalgia    GERD (gastroesophageal reflux disease)    Headache    Hypothyroidism    Obesity    Perioral dermatitis 03/12/2021   PONV (postoperative nausea and vomiting)    Wears glasses     Past Surgical History:  Procedure Laterality Date   ABDOMINAL HYSTERECTOMY     ADENOIDECTOMY     APPLICATION OF ROBOTIC ASSISTANCE FOR SPINAL PROCEDURE N/A 12/21/2016   Procedure: APPLICATION OF ROBOTIC ASSISTANCE FOR SPINAL PROCEDURE;  Surgeon: Loura Halt Ditty, MD;  Location: [facility];  Service: Neurosurgery;  Laterality: N/A;   BACK SURGERY     L5-S1   CARPAL TUNNEL RELEASE Right 01/16/2019   Procedure: RIGHT CARPAL TUNNEL RELEASE;  Surgeon: Eldred Manges, MD;  Location: [facility];  Service: Orthopedics;  Laterality: Right;   COLONOSCOPY     CYST EXCISION     uterine ;subsequent sx followed   FOOT SURGERY     HAND  SURGERY     from AA   KNEE ARTHROSCOPY     x2   TONSILLECTOMY     TRIGGER FINGER RELEASE Right 01/16/2019   Procedure: RIGHT TRIGGER THUMB RELEASE;  Surgeon: Eldred Manges, MD;  Location: [facility];  Service: Orthopedics;  Laterality: Right;   TRIGGER FINGER RELEASE Right 12/04/2019   Procedure: right index trigger finger release;  Surgeon: Eldred Manges, MD;  Location: [facility];  Service: Orthopedics;  Laterality: Right;   UPPER GASTROINTESTINAL ENDOSCOPY      Family History  Problem Relation Age of Onset   Alzheimer's disease Mother    Diabetes Mother    Heart attack Father    Colon cancer Father    Heart failure Father    Thyroid disease Sister    Thyroid disease Brother    Stomach cancer Brother    Cancer Brother    Cancer Brother    ADD / ADHD Other    Esophageal cancer Neg Hx    Rectal cancer Neg Hx     Current Outpatient Medications on File Prior to Visit  Medication Sig Dispense Refill   albuterol (VENTOLIN HFA) 108 (90 Base) MCG/ACT inhaler INHALE 2 PUFFS BY MOUTH EVERY 4 HOURS AS NEEDED FOR WHEEZING OR SHORTNESS OF BREATH 8.5 g 1   atorvastatin (LIPITOR) 40 MG tablet TAKE 1 TABLET BY MOUTH DAILY  90 tablet 1   buPROPion (WELLBUTRIN XL) 150 MG 24 hr tablet Take 3 tablets (450 mg total) by mouth daily. 270 tablet 1   cyclobenzaprine (FLEXERIL) 10 MG tablet 1 TABLET EVERY 8-12 HOURS AS NEEDED FOR SEVERE MUSCLE SPASMS 270 tablet 1   DULoxetine (CYMBALTA) 60 MG capsule Take 2 capsules (120 mg total) by mouth at bedtime. 180 capsule 1   gabapentin (NEURONTIN) 800 MG tablet Take 1 tablet (800 mg total) by mouth 3 (three) times daily. 270 tablet 0   ibuprofen (ADVIL) 800 MG tablet Take 1 tablet (800 mg total) by mouth every 8 (eight) hours as needed. 60 tablet 3   levothyroxine (SYNTHROID) 100 MCG tablet 1 tab ( )  by mouth 6 days a week. Take 1.5 tab ( ) by mouth 1 day a week. Take on empty stomach with no other medications, wait 30  minutes before eating. 90 tablet 3   montelukast (SINGULAIR) 10 MG tablet TAKE 1 TABLET BY MOUTH AT  BEDTIME 90 tablet 1   omeprazole (PRILOSEC) 40 MG capsule TAKE 1 CAPSULE BY MOUTH TWICE DAILY 180 capsule 1   ondansetron (ZOFRAN-ODT) 4 MG disintegrating tablet Take 1 tablet (4 mg total) by mouth every 8 (eight) hours as needed for nausea or vomiting. 30 tablet 3   polyethylene glycol (MIRALAX / GLYCOLAX) 17 g packet Take 17 g by mouth 2 (two) times daily. 180 packet 0   psyllium (METAMUCIL) 58.6 % packet Take 1 packet by mouth 2 (two) times daily. 60 packet 2   No current facility-administered medications on file prior to visit.    Allergies  Allergen Reactions   Phentermine Other (See Comments)    Tearing, lightheadedness, depression   Tape Other (See Comments)    " I get red and itchy." Paper tape okay   Bactrim [Sulfamethoxazole-Trimethoprim] Swelling and Rash   Percocet [Oxycodone-Acetaminophen] Hives and Rash   Tetanus Toxoids Nausea And Vomiting   Tylenol With Codeine #3 [Acetaminophen-Codeine] Rash    Social History   Substance and Sexual Activity  Alcohol Use Not Currently    Social History   Tobacco Use  Smoking Status Former  Smokeless Tobacco Never  Tobacco Comments   stopped smoking cigarettes at age 61    Review of Systems  Constitutional:  Positive for malaise/fatigue.  HENT:  Positive for sinus pain and sore throat.   Eyes: Negative.   Respiratory:  Positive for cough.   Cardiovascular: Negative.   Gastrointestinal:  Positive for abdominal pain, constipation, heartburn, nausea and vomiting.  Genitourinary: Negative.   Musculoskeletal:  Positive for back pain, joint pain and neck pain.  Skin: Negative.   Neurological:  Positive for tremors and sensory change.  Endo/Heme/Allergies: Negative.   Psychiatric/Behavioral: Negative.      Objective   Vitals:   09/16/23 0955  BP: 93/68  Pulse: 95  Resp: 14  Temp: 98.6 F (37 C)  SpO2: 93%     Physical Exam Vitals reviewed.  Constitutional:      Appearance: Normal appearance. She is not ill-appearing.  HENT:     Head: Normocephalic and atraumatic.  Eyes:     General: No scleral icterus. Cardiovascular:     Rate and Rhythm: Normal rate and regular rhythm.     Heart sounds: Normal heart sounds. No murmur heard.    No friction rub. No gallop.  Pulmonary:     Effort: Pulmonary effort is normal. No respiratory distress.     Breath sounds: Normal breath sounds. No stridor. No  wheezing, rhonchi or rales.  Abdominal:     General: Bowel sounds are normal. There is no distension.     Palpations: Abdomen is soft. There is no mass.     Tenderness: There is no abdominal tenderness. There is no guarding or rebound.     Hernia: No hernia is present.     Comments: She does have some discomfort to deep palpation in the right upper quadrant over the gallbladder.  Skin:    General: Skin is warm and dry.  Neurological:     Mental Status: She is alert and oriented to person, place, and time.   Dr. Marcelino Freestone notes reviewed.  Ultrasound report reviewed.  Assessment  Right upper quadrant pain of unknown etiology.  She does give a history that is consistent with biliary colic.  I doubt at this point that a HIDA scan would be useful as her workup thus far has been negative for any other source.  She is undergoing an EGD on 09/20/2023. Plan  Pending the results of the EGD, I do recommend a cholecystectomy.  The risks and benefits of the procedure including bleeding, infection, hepatobiliary injury, and the possibility of open procedure were fully explained to the patient.  Will proceed with cholecystectomy should the EGD be negative.

## 2023-09-20 ENCOUNTER — Ambulatory Visit (HOSPITAL_COMMUNITY): Payer: Self-pay | Admitting: Certified Registered Nurse Anesthetist

## 2023-09-20 ENCOUNTER — Encounter (HOSPITAL_COMMUNITY): Payer: Self-pay | Admitting: Gastroenterology

## 2023-09-20 ENCOUNTER — Ambulatory Visit (HOSPITAL_COMMUNITY)
Admission: RE | Admit: 2023-09-20 | Discharge: 2023-09-20 | Disposition: A | Discharge status: Home / Self Care | Payer: Medicare HMO | Attending: Gastroenterology | Admitting: Gastroenterology

## 2023-09-20 ENCOUNTER — Encounter (HOSPITAL_COMMUNITY)
Admission: RE | Disposition: A | Discharge status: Home / Self Care | Payer: Self-pay | Source: Home / Self Care | Attending: Gastroenterology

## 2023-09-20 ENCOUNTER — Other Ambulatory Visit: Payer: Self-pay

## 2023-09-20 DIAGNOSIS — Z79899 Other long term (current) drug therapy: Secondary | ICD-10-CM | POA: Diagnosis not present

## 2023-09-20 DIAGNOSIS — K3189 Other diseases of stomach and duodenum: Secondary | ICD-10-CM

## 2023-09-20 DIAGNOSIS — K219 Gastro-esophageal reflux disease without esophagitis: Secondary | ICD-10-CM | POA: Insufficient documentation

## 2023-09-20 DIAGNOSIS — G8929 Other chronic pain: Secondary | ICD-10-CM | POA: Insufficient documentation

## 2023-09-20 DIAGNOSIS — J45909 Unspecified asthma, uncomplicated: Secondary | ICD-10-CM | POA: Insufficient documentation

## 2023-09-20 DIAGNOSIS — J449 Chronic obstructive pulmonary disease, unspecified: Secondary | ICD-10-CM | POA: Diagnosis not present

## 2023-09-20 DIAGNOSIS — R1310 Dysphagia, unspecified: Secondary | ICD-10-CM | POA: Diagnosis not present

## 2023-09-20 DIAGNOSIS — E611 Iron deficiency: Secondary | ICD-10-CM | POA: Insufficient documentation

## 2023-09-20 DIAGNOSIS — K2289 Other specified disease of esophagus: Secondary | ICD-10-CM

## 2023-09-20 DIAGNOSIS — K5909 Other constipation: Secondary | ICD-10-CM | POA: Insufficient documentation

## 2023-09-20 DIAGNOSIS — R109 Unspecified abdominal pain: Secondary | ICD-10-CM

## 2023-09-20 DIAGNOSIS — K319 Disease of stomach and duodenum, unspecified: Secondary | ICD-10-CM | POA: Diagnosis not present

## 2023-09-20 DIAGNOSIS — Z800 Family history of malignant neoplasm of digestive organs: Secondary | ICD-10-CM | POA: Diagnosis not present

## 2023-09-20 DIAGNOSIS — F32A Depression, unspecified: Secondary | ICD-10-CM | POA: Diagnosis not present

## 2023-09-20 DIAGNOSIS — Z87891 Personal history of nicotine dependence: Secondary | ICD-10-CM | POA: Insufficient documentation

## 2023-09-20 DIAGNOSIS — K2970 Gastritis, unspecified, without bleeding: Secondary | ICD-10-CM

## 2023-09-20 HISTORY — PX: SAVORY DILATION: SHX5439

## 2023-09-20 HISTORY — DX: Sleep apnea, unspecified: G47.30

## 2023-09-20 HISTORY — PX: ESOPHAGOGASTRODUODENOSCOPY (EGD) WITH PROPOFOL: SHX5813

## 2023-09-20 HISTORY — PX: BIOPSY: SHX5522

## 2023-09-20 HISTORY — DX: Chronic obstructive pulmonary disease, unspecified: J44.9

## 2023-09-20 SURGERY — ESOPHAGOGASTRODUODENOSCOPY (EGD) WITH PROPOFOL
Anesthesia: General

## 2023-09-20 MED ORDER — LACTATED RINGERS IV SOLN: INTRAVENOUS | Status: DC

## 2023-09-20 MED ORDER — LIDOCAINE HCL (CARDIAC) PF 100 MG/5ML IV SOSY
PREFILLED_SYRINGE | INTRAVENOUS | Status: DC | PRN
  Administered 2023-09-20: 50 mg via INTRAVENOUS

## 2023-09-20 MED ORDER — LACTATED RINGERS IV SOLN: INTRAVENOUS | Status: DC | PRN

## 2023-09-20 MED ORDER — PROPOFOL 500 MG/50ML IV EMUL
INTRAVENOUS | Status: DC | PRN
  Administered 2023-09-20: 180 ug/kg/min via INTRAVENOUS
  Administered 2023-09-20: 80 mg via INTRAVENOUS

## 2023-09-20 MED ORDER — LIDOCAINE HCL (PF) 2 % IJ SOLN
INTRAMUSCULAR | Status: AC
  Filled 2023-09-20: qty 5

## 2023-09-20 MED ORDER — PROPOFOL 500 MG/50ML IV EMUL
INTRAVENOUS | Status: AC
  Filled 2023-09-20: qty 50

## 2023-09-20 NOTE — Op Note (Signed)
[facility] Patient Name: Laura Burnett Procedure Date: 09/20/2023 9:03 AM MRN: [id number] Date of Birth: [birth year] Attending MD: Sanjuan Dame , MD, [id number] CSN: [id number] Age: 58 Admit Type: Outpatient Procedure:                Upper GI endoscopy Indications:              Epigastric abdominal pain, Dysphagia Providers:                Sanjuan Dame, MD, Sheran Fava, Zena Amos Referring MD:              Medicines:                Monitored Anesthesia Care Complications:            No immediate complications. Estimated Blood Loss:     Estimated blood loss: none. Procedure:                Pre-Anesthesia Assessment:                           - Prior to the procedure, a History and Physical                            was performed, and patient medications and                            allergies were reviewed. The patient's tolerance of                            previous anesthesia was also reviewed. The risks                            and benefits of the procedure and the sedation                            options and risks were discussed with the patient.                            All questions were answered, and informed consent                            was obtained. Prior Anticoagulants: The patient has                            taken no anticoagulant or antiplatelet agents. ASA                            Grade Assessment: II - A patient with mild systemic                            disease. After reviewing the risks and benefits,  the patient was deemed in satisfactory condition to                            undergo the procedure.                           After obtaining informed consent, the endoscope was                            passed under direct vision. Throughout the                            procedure, the patient's blood pressure, pulse, and                            oxygen saturations  were monitored continuously. The                            GIF-H190 ([id number]) scope was introduced through the                            mouth, and advanced to the second part of duodenum.                            The upper GI endoscopy was accomplished without                            difficulty. The patient tolerated the procedure                            well. Scope In: 9:25:44 AM Scope Out: 9:36:14 AM Total Procedure Duration: 0 hours 10 minutes 30 seconds  Findings:      White nummular lesions were noted in the mid esophagus. The dilation       site was examined and showed no change.      Mildly erythematous mucosa without bleeding was found in the stomach.       Biopsies were taken with a cold forceps for histology.      The duodenal bulb and second portion of the duodenum were normal.       Biopsies for histology were taken with a cold forceps for evaluation of       celiac disease. Impression:               - White nummular lesions in esophageal mucosa. This                            could be medicine but candida less likely                           - Erythematous mucosa in the stomach. Biopsied.                           - Normal duodenal bulb and second portion of the  duodenum. Biopsied. Moderate Sedation:      Per Anesthesia Care Recommendation:           - Patient has a contact number available for                            emergencies. The signs and symptoms of potential                            delayed complications were discussed with the                            patient. Return to normal activities tomorrow.                            Written discharge instructions were provided to the                            patient.                           - Resume previous diet.                           - Continue present medications.                           - Await pathology results.                           -If biopsies are  negative, patient to follow up                            with surgery for abdominal pain as previously                            evalauted by Dr Lovell Sheehan Procedure Code(s):        --- Professional ---                           [phone], Esophagogastroduodenoscopy, flexible,                            transoral; with biopsy, single or multiple Diagnosis Code(s):        --- Professional ---                           K22.89, Other specified disease of esophagus                           K31.89, Other diseases of stomach and duodenum                           R10.13, Epigastric pain                           R13.10, Dysphagia, unspecified CPT copyright 2022 American Medical Association. All rights  reserved. The codes documented in this report are preliminary and upon coder review may  be revised to meet current compliance requirements. Sanjuan Dame, MD Sanjuan Dame, MD 09/20/2023 9:59:46 AM This report has been signed electronically. Number of Addenda: 1 Addendum Number: 1   Addendum Date: 09/23/2023 8:56:23 AM      Savory dilation was peformed with 16mm Sanjuan Dame, MD Sanjuan Dame, MD 09/23/2023 8:57:06 AM This report has been signed electronically.

## 2023-09-20 NOTE — Interval H&P Note (Signed)
History and Physical Interval Note:  09/20/2023 9:01 AM  Laura Burnett  has presented today for surgery, with the diagnosis of dysphagia, abdominal pain.  The various methods of treatment have been discussed with the patient and family. After consideration of risks, benefits and other options for treatment, the patient has consented to  Procedure(s) with comments: ESOPHAGOGASTRODUODENOSCOPY (EGD) WITH PROPOFOL (N/A) - 10:00am;asa 1-2 as a surgical intervention.  The patient's history has been reviewed, patient examined, no change in status, stable for surgery.  I have reviewed the patient's chart and labs.  Questions were answered to the patient's satisfaction.     [name] Kalijah Zeiss

## 2023-09-20 NOTE — Anesthesia Preprocedure Evaluation (Signed)
Anesthesia Evaluation  Patient identified by MRN, date of birth, ID band Patient awake    Reviewed: Allergy & Precautions, H&P , NPO status , Patient's Chart, lab work & pertinent test results, reviewed documented beta blocker date and time   History of Anesthesia Complications (+) PONV and history of anesthetic complications  Airway Mallampati: II  TM Distance: >3 FB Neck ROM: full    Dental no notable dental hx.    Pulmonary neg pulmonary ROS, asthma , sleep apnea , COPD, former smoker   Pulmonary exam normal breath sounds clear to auscultation       Cardiovascular Exercise Tolerance: Good hypertension, + CAD  negative cardio ROS  Rhythm:regular Rate:Normal     Neuro/Psych  Headaches PSYCHIATRIC DISORDERS Anxiety Depression     Neuromuscular disease negative neurological ROS  negative psych ROS   GI/Hepatic negative GI ROS, Neg liver ROS,GERD  ,,  Endo/Other  negative endocrine ROSHypothyroidism    Renal/GU negative Renal ROS  negative genitourinary   Musculoskeletal   Abdominal   Peds  Hematology negative hematology ROS (+)   Anesthesia Other Findings   Reproductive/Obstetrics negative OB ROS                             Anesthesia Physical Anesthesia Plan  ASA: 2  Anesthesia Plan: General   Post-op Pain Management:    Induction:   PONV Risk Score and Plan: Propofol infusion  Airway Management Planned:   Additional Equipment:   Intra-op Plan:   Post-operative Plan:   Informed Consent: I have reviewed the patients History and Physical, chart, labs and discussed the procedure including the risks, benefits and alternatives for the proposed anesthesia with the patient or authorized representative who has indicated his/her understanding and acceptance.     Dental Advisory Given  Plan Discussed with: CRNA  Anesthesia Plan Comments:        Anesthesia Quick  Evaluation

## 2023-09-20 NOTE — Anesthesia Postprocedure Evaluation (Signed)
Anesthesia Post Note  Patient: Laura Burnett  Procedure(s) Performed: ESOPHAGOGASTRODUODENOSCOPY (EGD) WITH PROPOFOL BIOPSY SAVORY DILATION  Patient location during evaluation: Phase II Anesthesia Type: General Level of consciousness: awake Pain management: pain level controlled Vital Signs Assessment: post-procedure vital signs reviewed and stable Respiratory status: spontaneous breathing and respiratory function stable Cardiovascular status: blood pressure returned to baseline and stable Postop Assessment: no headache and no apparent nausea or vomiting Anesthetic complications: no Comments: Late entry   No notable events documented.   Last Vitals:  Vitals:   09/20/23 0900 09/20/23 0940  BP: 108/69 108/76  Pulse: 95 87  Resp: 14 (!) 22  Temp: 36.7 C 36.9 C  SpO2: 95% 95%    Last Pain:  Vitals:   09/20/23 0940  TempSrc: Axillary  PainSc: 0-No pain                 Windell Norfolk

## 2023-09-20 NOTE — Transfer of Care (Signed)
Immediate Anesthesia Transfer of Care Note  Patient: Laura Burnett  Procedure(s) Performed: ESOPHAGOGASTRODUODENOSCOPY (EGD) WITH PROPOFOL BIOPSY SAVORY DILATION  Patient Location: Endoscopy Unit  Anesthesia Type:General  Level of Consciousness: awake, alert , and oriented  Airway & Oxygen Therapy: Patient Spontanous Breathing  Post-op Assessment: Report given to RN, Post -op Vital signs reviewed and stable, Patient moving all extremities X 4, and Patient able to stick tongue midline  Post vital signs: Reviewed and stable  Last Vitals:  Vitals Value Taken Time  BP 87/54 09/20/23 0940  Temp 36.9 C 09/20/23 0940  Pulse 87 09/20/23 0940  Resp 22 09/20/23 0940  SpO2 95 % 09/20/23 0940    Last Pain:  Vitals:   09/20/23 0940  TempSrc: Axillary  PainSc: 0-No pain         Complications: No notable events documented.

## 2023-09-20 NOTE — Discharge Instructions (Signed)

## 2023-09-21 ENCOUNTER — Encounter (HOSPITAL_COMMUNITY): Payer: Self-pay | Admitting: Gastroenterology

## 2023-09-22 LAB — SURGICAL PATHOLOGY

## 2023-09-23 ENCOUNTER — Encounter (INDEPENDENT_AMBULATORY_CARE_PROVIDER_SITE_OTHER): Payer: Self-pay | Admitting: *Deleted

## 2023-09-23 NOTE — Progress Notes (Signed)
I reviewed the pathology results. Ann, can you send her a letter with the findings as described below please?  Thanks,  Vista Lawman, MD Gastroenterology and Hepatology [facility]  ---------------------------------------------------------------------------------------------  [facility] [address], [address], [place], [place] [address] Phone:  [phone]   09/23/23 [place], [place]   Dear Laura Burnett,  I am writing to inform you that the biopsies taken during your recent endoscopic examination showed:  No H. Pylori bacteria in stomach , or any early cancer changes to the stomach mucosa ( Intestinal metaplasia)  Normal biopsies of the food-pipe ( no eosinophilic esophagitis or candida esophagitis  )  Normal biopsies of the small bowel not suggesting celiac disease  I recommend follow up with surgery for abdominal pain as previously evalauted by Dr Lovell Sheehan.  Also I value your feedback , so if you get a survey , please take the time to fill it out and thank you for choosing [facility]  Please call us at [phone] if you have persistent problems or have questions about your condition that have not been fully answered at this time.  Sincerely,  Vista Lawman, MD Gastroenterology and Hepatology

## 2023-09-27 ENCOUNTER — Telehealth (INDEPENDENT_AMBULATORY_CARE_PROVIDER_SITE_OTHER): Payer: Self-pay | Admitting: *Deleted

## 2023-09-27 NOTE — Telephone Encounter (Signed)
Pt called to get results of EGD. I let her know a letter was mailed on 1/23 that she should be getting soon and also I read her what was on the letter and she verbalized understanding of the results.

## 2023-09-28 ENCOUNTER — Encounter: Payer: Self-pay | Admitting: General Surgery

## 2023-09-28 ENCOUNTER — Ambulatory Visit: Payer: Medicare HMO | Admitting: General Surgery

## 2023-09-28 VITALS — BP 108/76 | HR 89 | Temp 98.0°F | Resp 16 | Ht 62.0 in | Wt 188.0 lb

## 2023-09-28 DIAGNOSIS — K811 Chronic cholecystitis: Secondary | ICD-10-CM | POA: Diagnosis not present

## 2023-09-28 DIAGNOSIS — K8050 Calculus of bile duct without cholangitis or cholecystitis without obstruction: Secondary | ICD-10-CM

## 2023-09-29 NOTE — Progress Notes (Signed)
Subjective:     Laura Burnett  Patient here for follow-up of her upper abdominal pain and nausea.  An EGD done by Dr. Tasia Catchings showed chronic gastritis, H. pylori negative.  She is still having multiple GI complaints but is nervous about undergoing a cholecystectomy. Objective:    BP 108/76   Pulse 89   Temp 98 F (36.7 C) (Oral)   Resp 16   Ht 5\' 2"  (1.575 m)   Wt 188 lb (85.3 kg)   SpO2 94%   BMI 34.39 kg/m   General:  alert, cooperative, and no distress  GI notes reviewed     Assessment:    Upper abdominal pain associated with nausea.  Ultrasound negative for cholelithiasis.  Chronic gastritis was seen on EGD.    Plan:   As patient is hesitant about undergoing a cholecystectomy, I did tell her we could get a HIDA scan to further assess the function of the gallbladder.  She would like to proceed with this test.  Follow-up here after the test is performed.

## 2023-10-01 ENCOUNTER — Encounter (HOSPITAL_COMMUNITY): Payer: Self-pay

## 2023-10-01 ENCOUNTER — Encounter (HOSPITAL_COMMUNITY)
Admission: RE | Admit: 2023-10-01 | Discharge: 2023-10-01 | Disposition: A | Discharge status: Home / Self Care | Payer: Medicare HMO | Source: Ambulatory Visit | Attending: General Surgery | Admitting: General Surgery

## 2023-10-01 DIAGNOSIS — K8050 Calculus of bile duct without cholangitis or cholecystitis without obstruction: Secondary | ICD-10-CM | POA: Insufficient documentation

## 2023-10-01 DIAGNOSIS — R1011 Right upper quadrant pain: Secondary | ICD-10-CM | POA: Diagnosis not present

## 2023-10-01 MED ORDER — TECHNETIUM TC 99M MEBROFENIN IV KIT
5.0000 | PACK | Freq: Once | INTRAVENOUS | Status: AC | PRN
  Administered 2023-10-01: 5.2 via INTRAVENOUS
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
## 2023-10-04 NOTE — Addendum Note (Signed)
Addended by: Phillips Odor on: 10/04/2023 09:53 AM   Modules accepted: Orders

## 2023-10-04 NOTE — H&P (Signed)
Laura Burnett; [id number]; 11-Jan-1966   HPI Patient is a 58 year old white female who was referred to my care by Dr. Tasia Catchings of gastroenterology for evaluation treatment of right upper quadrant abdominal pain.  She has multiple GI complaints including right upper quadrant abdominal pain, bloating, indigestion, and constipation.  She has had a pulmonary workup which has been negative for any specific source of her symptoms.  She does suffer from fibromyalgia.  She is scheduled undergo an EGD on 09/20/2023.  She did have an ultrasound which was negative for cholelithiasis.  She states the episodes occur almost daily in nature.  No specific trigger foods are noted.  She denies any fever, chills, or jaundice. Past Medical History:  Diagnosis Date   Abdominal pain, acute, epigastric 07/21/2019   Abscess 01/03/2022   Allergy    seasonal allergies/animals   Anxiety    Arthritis    Asthma    as a child " exercise induced"   Bronchitis    Constipation    Depression    DJD (degenerative joint disease)    Encounter for medical examination to establish care 06/16/2021   Fibromyalgia    GERD (gastroesophageal reflux disease)    Headache    Hypothyroidism    Obesity    Perioral dermatitis 03/12/2021   PONV (postoperative nausea and vomiting)    Wears glasses     Past Surgical History:  Procedure Laterality Date   ABDOMINAL HYSTERECTOMY     ADENOIDECTOMY     APPLICATION OF ROBOTIC ASSISTANCE FOR SPINAL PROCEDURE N/A 12/21/2016   Procedure: APPLICATION OF ROBOTIC ASSISTANCE FOR SPINAL PROCEDURE;  Surgeon: Loura Halt Ditty, MD;  Location: [facility];  Service: Neurosurgery;  Laterality: N/A;   BACK SURGERY     L5-S1   CARPAL TUNNEL RELEASE Right 01/16/2019   Procedure: RIGHT CARPAL TUNNEL RELEASE;  Surgeon: Eldred Manges, MD;  Location: [facility];  Service: Orthopedics;  Laterality: Right;   COLONOSCOPY     CYST EXCISION     uterine ;subsequent sx followed   FOOT SURGERY     HAND  SURGERY     from [facility]   KNEE ARTHROSCOPY     x2   TONSILLECTOMY     TRIGGER FINGER RELEASE Right 01/16/2019   Procedure: RIGHT TRIGGER THUMB RELEASE;  Surgeon: Eldred Manges, MD;  Location: [facility];  Service: Orthopedics;  Laterality: Right;   TRIGGER FINGER RELEASE Right 12/04/2019   Procedure: right index trigger finger release;  Surgeon: Eldred Manges, MD;  Location: [facility];  Service: Orthopedics;  Laterality: Right;   UPPER GASTROINTESTINAL ENDOSCOPY      Family History  Problem Relation Age of Onset   Alzheimer's disease Mother    Diabetes Mother    Heart attack Father    Colon cancer Father    Heart failure Father    Thyroid disease Sister    Thyroid disease Brother    Stomach cancer Brother    Cancer Brother    Cancer Brother    ADD / ADHD Other    Esophageal cancer Neg Hx    Rectal cancer Neg Hx     Current Outpatient Medications on File Prior to Visit  Medication Sig Dispense Refill   albuterol (VENTOLIN HFA) 108 (90 Base) MCG/ACT inhaler INHALE 2 PUFFS BY MOUTH EVERY 4 HOURS AS NEEDED FOR WHEEZING OR SHORTNESS OF BREATH 8.5 g 1   atorvastatin (LIPITOR) 40 MG tablet TAKE 1 TABLET BY MOUTH DAILY  90 tablet 1   buPROPion (WELLBUTRIN XL) 150 MG 24 hr tablet Take 3 tablets (450 mg total) by mouth daily. 270 tablet 1   cyclobenzaprine (FLEXERIL) 10 MG tablet 1 TABLET EVERY 8-12 HOURS AS NEEDED FOR SEVERE MUSCLE SPASMS 270 tablet 1   DULoxetine (CYMBALTA) 60 MG capsule Take 2 capsules (120 mg total) by mouth at bedtime. 180 capsule 1   gabapentin (NEURONTIN) 800 MG tablet Take 1 tablet (800 mg total) by mouth 3 (three) times daily. 270 tablet 0   ibuprofen (ADVIL) 800 MG tablet Take 1 tablet (800 mg total) by mouth every 8 (eight) hours as needed. 60 tablet 3   levothyroxine (SYNTHROID) 100 MCG tablet 1 tab ( )  by mouth 6 days a week. Take 1.5 tab ( ) by mouth 1 day a week. Take on empty stomach with no other medications, wait 30  minutes before eating. 90 tablet 3   montelukast (SINGULAIR) 10 MG tablet TAKE 1 TABLET BY MOUTH AT  BEDTIME 90 tablet 1   omeprazole (PRILOSEC) 40 MG capsule TAKE 1 CAPSULE BY MOUTH TWICE DAILY 180 capsule 1   ondansetron (ZOFRAN-ODT) 4 MG disintegrating tablet Take 1 tablet (4 mg total) by mouth every 8 (eight) hours as needed for nausea or vomiting. 30 tablet 3   polyethylene glycol (MIRALAX / GLYCOLAX) 17 g packet Take 17 g by mouth 2 (two) times daily. 180 packet 0   psyllium (METAMUCIL) 58.6 % packet Take 1 packet by mouth 2 (two) times daily. 60 packet 2   No current facility-administered medications on file prior to visit.    Allergies  Allergen Reactions   Phentermine Other (See Comments)    Tearing, lightheadedness, depression   Tape Other (See Comments)    " I get red and itchy." Paper tape okay   Bactrim [Sulfamethoxazole-Trimethoprim] Swelling and Rash   Percocet [Oxycodone-Acetaminophen] Hives and Rash   Tetanus Toxoids Nausea And Vomiting   Tylenol With Codeine #3 [Acetaminophen-Codeine] Rash    Social History   Substance and Sexual Activity  Alcohol Use Not Currently    Social History   Tobacco Use  Smoking Status Former  Smokeless Tobacco Never  Tobacco Comments   stopped smoking cigarettes at age 38    Review of Systems  Constitutional:  Positive for malaise/fatigue.  HENT:  Positive for sinus pain and sore throat.   Eyes: Negative.   Respiratory:  Positive for cough.   Cardiovascular: Negative.   Gastrointestinal:  Positive for abdominal pain, constipation, heartburn, nausea and vomiting.  Genitourinary: Negative.   Musculoskeletal:  Positive for back pain, joint pain and neck pain.  Skin: Negative.   Neurological:  Positive for tremors and sensory change.  Endo/Heme/Allergies: Negative.   Psychiatric/Behavioral: Negative.      Objective   Vitals:   09/16/23 0955  BP: 93/68  Pulse: 95  Resp: 14  Temp: 98.6 F (37 C)  SpO2: 93%     Physical Exam Vitals reviewed.  Constitutional:      Appearance: Normal appearance. She is not ill-appearing.  HENT:     Head: Normocephalic and atraumatic.  Eyes:     General: No scleral icterus. Cardiovascular:     Rate and Rhythm: Normal rate and regular rhythm.     Heart sounds: Normal heart sounds. No murmur heard.    No friction rub. No gallop.  Pulmonary:     Effort: Pulmonary effort is normal. No respiratory distress.     Breath sounds: Normal breath sounds. No stridor. No  wheezing, rhonchi or rales.  Abdominal:     General: Bowel sounds are normal. There is no distension.     Palpations: Abdomen is soft. There is no mass.     Tenderness: There is no abdominal tenderness. There is no guarding or rebound.     Hernia: No hernia is present.     Comments: She does have some discomfort to deep palpation in the right upper quadrant over the gallbladder.  Skin:    General: Skin is warm and dry.  Neurological:     Mental Status: She is alert and oriented to person, place, and time.   Dr. Marcelino Freestone notes reviewed.  Ultrasound report reviewed.  Assessment  Right upper quadrant pain of unknown etiology.  She does give a history that is consistent with biliary colic.  I doubt at this point that a HIDA scan would be useful as her workup thus far has been negative for any other source.  She is undergoing an EGD on 09/20/2023. Plan  Pending the results of the EGD, I do recommend a cholecystectomy.  The risks and benefits of the procedure including bleeding, infection, hepatobiliary injury, and the possibility of open procedure were fully explained to the patient.  Will proceed with cholecystectomy should the EGD be negative. Addendum: EGD unremarkable.  HIDA scan performed reveals a ejection fraction of 18%.  Will proceed with robotic assisted laparoscopic cholecystectomy on 10/13/2023.

## 2023-10-06 ENCOUNTER — Encounter: Payer: Self-pay | Admitting: Psychiatry

## 2023-10-06 ENCOUNTER — Telehealth: Payer: Medicare HMO | Admitting: Psychiatry

## 2023-10-06 DIAGNOSIS — F411 Generalized anxiety disorder: Secondary | ICD-10-CM

## 2023-10-06 DIAGNOSIS — F3341 Major depressive disorder, recurrent, in partial remission: Secondary | ICD-10-CM | POA: Diagnosis not present

## 2023-10-06 NOTE — Patient Instructions (Signed)
 Continue duloxetine  120 mg at night  Continue bupropion  450 mg daily  Next appointment: 3/28 at 10 AM

## 2023-10-08 NOTE — Patient Instructions (Signed)
 Your procedure is scheduled on: 10/13/2023  Report to [facility] Main Entrance at  11:20   AM.  Call this number if you have problems the morning of surgery: [phone]   Remember:   Do not Eat or Drink after midnight         No Smoking the morning of surgery  :  Take these medicines the morning of surgery with A SIP OF WATER : Wellbutrin , Flexeril , Cymbalta , gabapentin , levothyroxine , and omeprazole    Do not wear jewelry, make-up or nail polish.  Do not wear lotions, powders, or perfumes. You may wear deodorant.  Do not shave 48 hours prior to surgery. Men may shave face and neck.  Do not bring valuables to the hospital.  Contacts, dentures or bridgework may not be worn into surgery.  Leave suitcase in the car. After surgery it may be brought to your room.  For patients admitted to the hospital, checkout time is 11:00 AM the day of discharge.   Patients discharged the day of surgery will not be allowed to drive home.    Special Instructions: Shower using CHG night before surgery and shower the day of surgery use CHG.  Use special wash - you have one bottle of CHG for all showers.  You should use approximately 1/2 of the bottle for each shower. How to Use Chlorhexidine  at Home in the Shower Chlorhexidine  gluconate (CHG) is a germ-killing (antiseptic) wash that's used to clean the skin. It can get rid of the germs that normally live on the skin and can keep them away for about 24 hours. If you're having surgery, you may be told to shower with CHG at home the night before surgery. This can help lower your risk for infection. To use CHG wash in the shower, follow the steps below. Supplies needed: CHG body wash. Clean washcloth. Clean towel. How to use CHG in the shower Follow these steps unless you're told to use CHG in a different way: Start the shower. Use your normal soap and shampoo to wash your face and hair. Turn off the shower or move out of the shower stream. Pour CHG onto  a clean washcloth. Do not use any type of brush or rough sponge. Start at your neck, washing your body down to your toes. Make sure you: Wash the part of your body where the surgery will be done for at least 1 minute. Do not scrub. Do not use CHG on your head or face unless your health care provider tells you to. If it gets into your ears or eyes, rinse them well with water . Do not wash your genitals with CHG. Wash your back and under your arms. Make sure to wash skin folds. Let the CHG sit on your skin for 1-2 minutes or as long as told. Rinse your entire body in the shower, including all body creases and folds. Turn off the shower. Dry off with a clean towel. Do not put anything on your skin afterward, such as powder, lotion, or perfume. Put on clean clothes or pajamas. If it's the night before surgery, sleep in clean sheets. General tips Use CHG only as told, and follow the instructions on the label. Use the full amount of CHG as told. This is often one bottle. Do not smoke and stay away from flames after using CHG. Your skin may feel sticky after using CHG. This is normal. The sticky feeling will go away as the CHG dries. Do not use CHG: If you have  a chlorhexidine  allergy or have reacted to chlorhexidine  in the past. On open wounds or areas of skin that have broken skin, cuts, or scrapes. On babies younger than 31 months of age. Contact a health care provider if: You have questions about using CHG. Your skin gets irritated or itchy. You have a rash after using CHG. You swallow any CHG. Call your local poison control center [phone] in the [place] Your eyes itch badly, or they become very red or swollen. Your hearing changes. You have trouble seeing. If you can't reach your provider, go to an urgent care or emergency room. Do not drive yourself. Get help right away if: You have swelling or tingling in your mouth or throat. You make high-pitched whistling sounds when you  breathe, most often when you breathe out (wheeze). You have trouble breathing. These symptoms may be an emergency. Call 911 right away. Do not wait to see if the symptoms will go away. Do not drive yourself to the hospital. This information is not intended to replace advice given to you by your health care provider. Make sure you discuss any questions you have with your health care provider. Document Revised: 03/02/2023 Document Reviewed: 02/26/2022 Elsevier Patient Education  2024 [employer] Minimally Invasive Cholecystectomy, Care After The following information offers guidance on how to care for yourself after your procedure. Your health care provider may also give you more specific instructions. If you have problems or questions, contact your health care provider. What can I expect after the procedure? After the procedure, it is common to have: Pain at your incision sites. You will be given medicines to control this pain. Mild nausea or vomiting. Bloating and possible shoulder pain from the gas that was used during the procedure. Follow these instructions at home: Medicines Take over-the-counter and prescription medicines only as told by your health care provider. If you were prescribed an antibiotic medicine, take it as told by your health care provider. Do not stop using the antibiotic even if you start to feel better. Ask your health care provider if the medicine prescribed to you: Requires you to avoid driving or using machinery. Can cause constipation. You may need to take these actions to prevent or treat constipation: Drink enough fluid to keep your urine pale yellow. Take over-the-counter or prescription medicines. Eat foods that are high in fiber, such as beans, whole grains, and fresh fruits and vegetables. Limit foods that are high in fat and processed sugars, such as fried or sweet foods. Incision care  Follow instructions from your health care provider about how to  take care of your incisions. Make sure you: Wash your hands with soap and water  for at least 20 seconds before and after you change your bandage (dressing). If soap and water  are not available, use hand sanitizer. Change your dressing as told by your health care provider. Leave stitches (sutures), skin glue, or adhesive strips in place. These skin closures may need to be in place for 2 weeks or longer. If adhesive strip edges start to loosen and curl up, you may trim the loose edges. Do not remove adhesive strips completely unless your health care provider tells you to do that. Do not take baths, swim, or use a hot tub until your health care provider approves. Ask your health care provider if you may take showers. You may only be allowed to take sponge baths. Check your incision area every day for signs of infection. Check for: More redness, swelling, or pain.  Fluid or blood. Warmth. Pus or a bad smell. Activity Rest as told by your health care provider. Do not do activities that require a lot of effort. Avoid sitting for a long time without moving. Get up to take short walks every 1-2 hours. This is important to improve blood flow and breathing. Ask for help if you feel weak or unsteady. Do not lift anything that is heavier than 10 lb (4.5 kg), or the limit that you are told, until your health care provider says that it is safe. Do not play contact sports until your health care provider approves. Do not return to work or school until your health care provider approves. Return to your normal activities as told by your health care provider. Ask your health care provider what activities are safe for you. General instructions If you were given a sedative during the procedure, it can affect you for several hours. Do not drive or operate machinery until your health care provider says that it is safe. Keep all follow-up visits. This is important. Contact a health care provider if: You develop a  rash. You have more redness, swelling, or pain around your incisions. You have fluid or blood coming from your incisions. Your incisions feel warm to the touch. You have pus or a bad smell coming from your incisions. You have a fever. One or more of your incisions breaks open. Get help right away if: You have trouble breathing. You have chest pain. You have more pain in your shoulders. You faint or feel dizzy when you stand. You have severe pain in your abdomen. You have nausea or vomiting that lasts for more than one day. You have leg pain that is new or unusual, or if it is localized to one specific spot. These symptoms may represent a serious problem that is an emergency. Do not wait to see if the symptoms will go away. Get medical help right away. Call your local emergency services (911 in the [place] Do not drive yourself to the hospital. Summary After your procedure, it is common to have pain at the incision sites. You may also have nausea or bloating. Follow your health care provider's instructions about medicine, activity restrictions, and caring for your incision areas. Do not do activities that require a lot of effort. Contact a health care provider if you have a fever or other signs of infection, such as more redness, swelling, or pain around the incisions. Get help right away if you have chest pain, increasing pain in the shoulders, or trouble breathing. This information is not intended to replace advice given to you by your health care provider. Make sure you discuss any questions you have with your health care provider. Document Revised: 02/17/2021 Document Reviewed: 02/18/2021 Elsevier Patient Education  2024 Else[employer] Inc. General Anesthesia, Adult, Care After The following information offers guidance on how to care for yourself after your procedure. Your health care provider may also give you more specific instructions. If you have problems or questions, contact your health  care provider. What can I expect after the procedure? After the procedure, it is common for people to: Have pain or discomfort at the IV site. Have nausea or vomiting. Have a sore throat or hoarseness. Have trouble concentrating. Feel cold or chills. Feel weak, sleepy, or tired (fatigue). Have soreness and body aches. These can affect parts of the body that were not involved in surgery. Follow these instructions at home: For the time period you were told by your  health care provider:  Rest. Do not participate in activities where you could fall or become injured. Do not drive or use machinery. Do not drink alcohol. Do not take sleeping pills or medicines that cause drowsiness. Do not make important decisions or sign legal documents. Do not take care of children on your own. General instructions Drink enough fluid to keep your urine pale yellow. If you have sleep apnea, surgery and certain medicines can increase your risk for breathing problems. Follow instructions from your health care provider about wearing your sleep device: Anytime you are sleeping, including during daytime naps. While taking prescription pain medicines, sleeping medicines, or medicines that make you drowsy. Return to your normal activities as told by your health care provider. Ask your health care provider what activities are safe for you. Take over-the-counter and prescription medicines only as told by your health care provider. Do not use any products that contain nicotine or tobacco. These products include cigarettes, chewing tobacco, and vaping devices, such as e-cigarettes. These can delay incision healing after surgery. If you need help quitting, ask your health care provider. Contact a health care provider if: You have nausea or vomiting that does not get better with medicine. You vomit every time you eat or drink. You have pain that does not get better with medicine. You cannot urinate or have bloody  urine. You develop a skin rash. You have a fever. Get help right away if: You have trouble breathing. You have chest pain. You vomit blood. These symptoms may be an emergency. Get help right away. Call 911. Do not wait to see if the symptoms will go away. Do not drive yourself to the hospital. Summary After the procedure, it is common to have a sore throat, hoarseness, nausea, vomiting, or to feel weak, sleepy, or fatigue. For the time period you were told by your health care provider, do not drive or use machinery. Get help right away if you have difficulty breathing, have chest pain, or vomit blood. These symptoms may be an emergency. This information is not intended to replace advice given to you by your health care provider. Make sure you discuss any questions you have with your health care provider. Document Revised: 11/14/2021 Document Reviewed: 11/14/2021 Elsevier Patient Education  2024 [employer].

## 2023-10-11 ENCOUNTER — Encounter (HOSPITAL_COMMUNITY): Payer: Self-pay

## 2023-10-11 ENCOUNTER — Encounter (HOSPITAL_COMMUNITY)
Admission: RE | Admit: 2023-10-11 | Discharge: 2023-10-11 | Disposition: A | Discharge status: Home / Self Care | Payer: Medicare HMO | Source: Ambulatory Visit | Attending: General Surgery | Admitting: General Surgery

## 2023-10-11 DIAGNOSIS — Z01812 Encounter for preprocedural laboratory examination: Secondary | ICD-10-CM | POA: Insufficient documentation

## 2023-10-11 DIAGNOSIS — Z01818 Encounter for other preprocedural examination: Secondary | ICD-10-CM

## 2023-10-11 LAB — COMPREHENSIVE METABOLIC PANEL
ALT: 17 U/L (ref 0–44)
AST: 19 U/L (ref 15–41)
Albumin: 3.6 g/dL (ref 3.5–5.0)
Alkaline Phosphatase: 98 U/L (ref 38–126)
Anion gap: 10 (ref 5–15)
BUN: 16 mg/dL (ref 6–20)
CO2: 23 mmol/L (ref 22–32)
Calcium: 9 mg/dL (ref 8.9–10.3)
Chloride: 104 mmol/L (ref 98–111)
Creatinine, Ser: 0.89 mg/dL (ref 0.44–1.00)
GFR, Estimated: >60 mL/min (ref >60)
Glucose, Bld: 77 mg/dL (ref 70–99)
Potassium: 3.9 mmol/L (ref 3.5–5.1)
Sodium: 137 mmol/L (ref 135–145)
Total Bilirubin: 0.6 mg/dL (ref 0.0–1.2)
Total Protein: 6.7 g/dL (ref 6.5–8.1)

## 2023-10-11 LAB — CBC WITH DIFFERENTIAL/PLATELET
Abs Immature Granulocytes: 0.01 10*3/uL (ref 0.00–0.07)
Basophils Absolute: 0.1 10*3/uL (ref 0.0–0.1)
Basophils Relative: 1 %
Eosinophils Absolute: 0.3 10*3/uL (ref 0.0–0.5)
Eosinophils Relative: 4 %
HCT: 40 % (ref 36.0–46.0)
Hemoglobin: 13 g/dL (ref 12.0–15.0)
Immature Granulocytes: 0 %
Lymphocytes Relative: 23 %
Lymphs Abs: 1.3 10*3/uL (ref 0.7–4.0)
MCH: 30.2 pg (ref 26.0–34.0)
MCHC: 32.5 g/dL (ref 30.0–36.0)
MCV: 92.8 fL (ref 80.0–100.0)
Monocytes Absolute: 0.7 10*3/uL (ref 0.1–1.0)
Monocytes Relative: 12 %
Neutro Abs: 3.5 10*3/uL (ref 1.7–7.7)
Neutrophils Relative %: 60 %
Platelets: 311 10*3/uL (ref 150–400)
RBC: 4.31 MIL/uL (ref 3.87–5.11)
RDW: 13.4 % (ref 11.5–15.5)
WBC: 5.8 10*3/uL (ref 4.0–10.5)
nRBC: 0 % (ref 0.0–0.2)

## 2023-10-13 ENCOUNTER — Encounter (HOSPITAL_COMMUNITY): Payer: Self-pay | Admitting: General Surgery

## 2023-10-13 ENCOUNTER — Encounter (HOSPITAL_COMMUNITY)
Admission: RE | Disposition: A | Discharge status: Home / Self Care | Payer: Self-pay | Source: Home / Self Care | Attending: General Surgery

## 2023-10-13 ENCOUNTER — Ambulatory Visit (HOSPITAL_BASED_OUTPATIENT_CLINIC_OR_DEPARTMENT_OTHER): Payer: Medicare HMO | Admitting: Anesthesiology

## 2023-10-13 ENCOUNTER — Ambulatory Visit (HOSPITAL_COMMUNITY)
Admission: RE | Admit: 2023-10-13 | Discharge: 2023-10-13 | Disposition: A | Discharge status: Home / Self Care | Payer: Medicare HMO | Attending: General Surgery | Admitting: General Surgery

## 2023-10-13 ENCOUNTER — Ambulatory Visit (HOSPITAL_COMMUNITY): Payer: Medicare HMO | Admitting: Anesthesiology

## 2023-10-13 DIAGNOSIS — Z01818 Encounter for other preprocedural examination: Secondary | ICD-10-CM

## 2023-10-13 DIAGNOSIS — K811 Chronic cholecystitis: Secondary | ICD-10-CM | POA: Insufficient documentation

## 2023-10-13 DIAGNOSIS — K660 Peritoneal adhesions (postprocedural) (postinfection): Secondary | ICD-10-CM | POA: Insufficient documentation

## 2023-10-13 DIAGNOSIS — M797 Fibromyalgia: Secondary | ICD-10-CM | POA: Diagnosis not present

## 2023-10-13 SURGERY — CHOLECYSTECTOMY, ROBOT-ASSISTED, LAPAROSCOPIC
Anesthesia: General | Site: Abdomen

## 2023-10-13 MED ORDER — LIDOCAINE HCL (CARDIAC) PF 100 MG/5ML IV SOSY
PREFILLED_SYRINGE | INTRAVENOUS | Status: DC | PRN
  Administered 2023-10-13: 100 mg via INTRAVENOUS

## 2023-10-13 MED ORDER — ONDANSETRON HCL 4 MG/2ML IJ SOLN
INTRAMUSCULAR | Status: DC | PRN
Start: 2023-10-13 — End: 2023-10-13
  Administered 2023-10-13: 4 mg via INTRAVENOUS

## 2023-10-13 MED ORDER — BUPIVACAINE HCL (PF) 0.5 % IJ SOLN
INTRAMUSCULAR | Status: AC
  Filled 2023-10-13: qty 30

## 2023-10-13 MED ORDER — METOCLOPRAMIDE HCL 5 MG/ML IJ SOLN
10.0000 mg | Freq: Once | INTRAMUSCULAR | Status: AC | PRN
  Administered 2023-10-13: 10 mg via INTRAVENOUS
  Filled 2023-10-13: qty 2

## 2023-10-13 MED ORDER — ROCURONIUM BROMIDE 10 MG/ML (PF) SYRINGE
PREFILLED_SYRINGE | INTRAVENOUS | Status: DC | PRN
  Administered 2023-10-13: 50 mg via INTRAVENOUS

## 2023-10-13 MED ORDER — FAMOTIDINE IN NACL 20-0.9 MG/50ML-% IV SOLN
20.0000 mg | Freq: Once | INTRAVENOUS | Status: AC
  Administered 2023-10-13: 20 mg via INTRAVENOUS
  Filled 2023-10-13: qty 50

## 2023-10-13 MED ORDER — HEMOSTATIC AGENTS (NO CHARGE) OPTIME
TOPICAL | Status: DC | PRN
  Administered 2023-10-13: 1 via TOPICAL

## 2023-10-13 MED ORDER — BUPIVACAINE HCL (PF) 0.5 % IJ SOLN
INTRAMUSCULAR | Status: DC | PRN
  Administered 2023-10-13: 30 mL

## 2023-10-13 MED ORDER — SUGAMMADEX SODIUM 200 MG/2ML IV SOLN
INTRAVENOUS | Status: DC | PRN
  Administered 2023-10-13: 400 mg via INTRAVENOUS

## 2023-10-13 MED ORDER — TRAMADOL HCL 50 MG PO TABS: 50.0000 mg | ORAL_TABLET | Freq: Four times a day (QID) | ORAL | 0 refills | Status: AC | PRN

## 2023-10-13 MED ORDER — KETOROLAC TROMETHAMINE 30 MG/ML IJ SOLN
30.0000 mg | Freq: Once | INTRAMUSCULAR | Status: AC
  Administered 2023-10-13: 30 mg via INTRAVENOUS
  Filled 2023-10-13: qty 1

## 2023-10-13 MED ORDER — STERILE WATER FOR IRRIGATION IR SOLN
Status: DC | PRN
  Administered 2023-10-13: 1000 mL

## 2023-10-13 MED ORDER — MIDAZOLAM HCL 2 MG/2ML IJ SOLN
INTRAMUSCULAR | Status: AC
Start: 2023-10-13 — End: ?
  Filled 2023-10-13: qty 2

## 2023-10-13 MED ORDER — INDOCYANINE GREEN 25 MG IV SOLR
INTRAVENOUS | Status: AC
  Administered 2023-10-13: 2.5 mg via INTRAVENOUS
  Filled 2023-10-13: qty 10

## 2023-10-13 MED ORDER — FENTANYL CITRATE PF 50 MCG/ML IJ SOSY
25.0000 ug | PREFILLED_SYRINGE | INTRAMUSCULAR | Status: DC | PRN
  Administered 2023-10-13: 25 ug via INTRAVENOUS
  Filled 2023-10-13: qty 1

## 2023-10-13 MED ORDER — INDOCYANINE GREEN 25 MG IV SOLR: 2.5000 mg | Freq: Once | INTRAVENOUS | Status: AC

## 2023-10-13 MED ORDER — ACETAMINOPHEN 10 MG/ML IV SOLN
INTRAVENOUS | Status: DC | PRN
  Administered 2023-10-13: 1000 mg via INTRAVENOUS

## 2023-10-13 MED ORDER — FENTANYL CITRATE (PF) 100 MCG/2ML IJ SOLN
INTRAMUSCULAR | Status: DC | PRN
  Administered 2023-10-13 (×2): 50 ug via INTRAVENOUS

## 2023-10-13 MED ORDER — MIDAZOLAM HCL 5 MG/5ML IJ SOLN
INTRAMUSCULAR | Status: DC | PRN
  Administered 2023-10-13: 2 mg via INTRAVENOUS

## 2023-10-13 MED ORDER — PHENYLEPHRINE HCL (PRESSORS) 10 MG/ML IV SOLN
INTRAVENOUS | Status: DC | PRN
  Administered 2023-10-13 (×3): 80 ug via INTRAVENOUS

## 2023-10-13 MED ORDER — LACTATED RINGERS IV SOLN: INTRAVENOUS | Status: DC | PRN

## 2023-10-13 MED ORDER — SCOPOLAMINE 1 MG/3DAYS TD PT72: 1.0000 | MEDICATED_PATCH | TRANSDERMAL | Status: DC

## 2023-10-13 MED ORDER — CHLORHEXIDINE GLUCONATE CLOTH 2 % EX PADS
6.0000 | MEDICATED_PAD | Freq: Once | CUTANEOUS | Status: AC
  Administered 2023-10-13: 6 via TOPICAL

## 2023-10-13 MED ORDER — DEXAMETHASONE SODIUM PHOSPHATE 10 MG/ML IJ SOLN
INTRAMUSCULAR | Status: DC | PRN
Start: 2023-10-13 — End: 2023-10-13
  Administered 2023-10-13: 5 mg via INTRAVENOUS

## 2023-10-13 MED ORDER — FENTANYL CITRATE (PF) 100 MCG/2ML IJ SOLN
INTRAMUSCULAR | Status: AC
  Filled 2023-10-13: qty 2

## 2023-10-13 MED ORDER — SCOPOLAMINE 1 MG/3DAYS TD PT72
MEDICATED_PATCH | TRANSDERMAL | Status: AC
  Administered 2023-10-13: 1.5 mg via TRANSDERMAL
  Filled 2023-10-13: qty 1

## 2023-10-13 MED ORDER — FAMOTIDINE IN NACL 20-0.9 MG/50ML-% IV SOLN
20.0000 mg | Freq: Once | INTRAVENOUS | Status: DC
  Filled 2023-10-13: qty 50

## 2023-10-13 MED ORDER — CEFAZOLIN SODIUM-DEXTROSE 2-4 GM/100ML-% IV SOLN
2.0000 g | INTRAVENOUS | Status: AC
  Administered 2023-10-13: 2 g via INTRAVENOUS
  Filled 2023-10-13: qty 100

## 2023-10-13 MED ORDER — MEPERIDINE HCL 50 MG/ML IJ SOLN: 6.2500 mg | INTRAMUSCULAR | Status: DC | PRN

## 2023-10-13 MED ORDER — PROPOFOL 10 MG/ML IV BOLUS
INTRAVENOUS | Status: DC | PRN
  Administered 2023-10-13: 150 mg via INTRAVENOUS

## 2023-10-13 SURGICAL SUPPLY — 37 items
CAUTERY HOOK MNPLR 1.6 DVNC XI (INSTRUMENTS) ×1 IMPLANT
CHLORAPREP W/TINT 26 (MISCELLANEOUS) ×1 IMPLANT
CLIP LIGATING HEM O LOK PURPLE (MISCELLANEOUS) ×1 IMPLANT
DERMABOND ADVANCED .7 DNX12 (GAUZE/BANDAGES/DRESSINGS) ×1 IMPLANT
DRAPE ARM DVNC X/XI (DISPOSABLE) ×4 IMPLANT
DRAPE COLUMN DVNC XI (DISPOSABLE) ×1 IMPLANT
ELECT REM PT RETURN 9FT ADLT (ELECTROSURGICAL) ×1
ELECTRODE REM PT RTRN 9FT ADLT (ELECTROSURGICAL) ×1 IMPLANT
FORCEPS BPLR R/ABLATION 8 DVNC (INSTRUMENTS) ×1 IMPLANT
FORCEPS PROGRASP DVNC XI (FORCEP) ×1 IMPLANT
GLOVE BIOGEL PI IND STRL 7.0 (GLOVE) ×2 IMPLANT
GLOVE SURG SS PI 7.5 STRL IVOR (GLOVE) ×2 IMPLANT
GOWN STRL REUS W/TWL LRG LVL3 (GOWN DISPOSABLE) ×3 IMPLANT
HEMOSTAT SNOW SURGICEL 2X4 (HEMOSTASIS) IMPLANT
KIT TURNOVER KIT A (KITS) ×1 IMPLANT
MANIFOLD NEPTUNE II (INSTRUMENTS) ×1 IMPLANT
NDL HYPO 21X1.5 SAFETY (NEEDLE) ×1 IMPLANT
NDL INSUFFLATION 14GA 120MM (NEEDLE) ×1 IMPLANT
NEEDLE HYPO 21X1.5 SAFETY (NEEDLE) ×1
NEEDLE INSUFFLATION 14GA 120MM (NEEDLE) ×1
OBTURATOR OPTICAL STND 8 DVNC (TROCAR) ×1
OBTURATOR OPTICALSTD 8 DVNC (TROCAR) ×1 IMPLANT
PACK LAP CHOLE LZT030E (CUSTOM PROCEDURE TRAY) ×1 IMPLANT
PAD ARMBOARD 7.5X6 YLW CONV (MISCELLANEOUS) ×1 IMPLANT
PENCIL HANDSWITCHING (ELECTRODE) ×1 IMPLANT
POSITIONER HEAD 8X9X4 ADT (SOFTGOODS) ×1 IMPLANT
SEAL UNIV 5-12 XI (MISCELLANEOUS) ×4 IMPLANT
SET BASIN LINEN APH (SET/KITS/TRAYS/PACK) ×1 IMPLANT
SET TUBE SMOKE EVAC HIGH FLOW (TUBING) ×1 IMPLANT
SPIKE FLUID TRANSFER (MISCELLANEOUS) ×1 IMPLANT
SUT MNCRL AB 4-0 PS2 18 (SUTURE) ×2 IMPLANT
SUT VICRYL 0 AB UR-6 (SUTURE) ×1 IMPLANT
SUT VICRYL 0 UR6 27IN ABS (SUTURE) IMPLANT
SYR 30ML LL (SYRINGE) ×1 IMPLANT
SYS RETRIEVAL 5MM INZII UNIV (BASKET) ×1
SYSTEM RETRIEVL 5MM INZII UNIV (BASKET) ×1 IMPLANT
WATER STERILE IRR 500ML POUR (IV SOLUTION) ×1 IMPLANT

## 2023-10-13 NOTE — Anesthesia Postprocedure Evaluation (Signed)
Anesthesia Post Note  Patient: Laura Burnett  Procedure(s) Performed: XI ROBOTIC ASSISTED LAPAROSCOPIC CHOLECYSTECTOMY (Abdomen)  Patient location during evaluation: PACU Anesthesia Type: General Level of consciousness: awake and alert Pain management: pain level controlled Vital Signs Assessment: post-procedure vital signs reviewed and stable Respiratory status: spontaneous breathing, nonlabored ventilation and respiratory function stable Cardiovascular status: blood pressure returned to baseline and stable Postop Assessment: no apparent nausea or vomiting Anesthetic complications: no   No notable events documented.   Last Vitals:  Vitals:   10/13/23 1451 10/13/23 1457  BP: 95/66 95/66  Pulse: 84 84  Resp: 14   Temp:  36.6 C  SpO2: 97% 97%    Last Pain:  Vitals:   10/13/23 1457  TempSrc: Oral  PainSc: 4                  Roslynn Amble

## 2023-10-13 NOTE — Anesthesia Procedure Notes (Signed)
Procedure Name: Intubation Date/Time: 10/13/2023 12:12 PM  Performed by: Lendon Ka, CRNAPre-anesthesia Checklist: Patient identified, Emergency Drugs available, Suction available and Patient being monitored Patient Re-evaluated:Patient Re-evaluated prior to induction Oxygen Delivery Method: Circle System Utilized Preoxygenation: Pre-oxygenation with 100% oxygen Induction Type: IV induction Ventilation: Mask ventilation without difficulty Laryngoscope Size: Miller and 2 Grade View: Grade II Tube type: Oral Tube size: 7.0 mm Number of attempts: 1 Airway Equipment and Method: Stylet Placement Confirmation: ETT inserted through vocal cords under direct vision, positive ETCO2 and breath sounds checked- equal and bilateral Secured at: 21 cm Tube secured with: Tape Dental Injury: Teeth and Oropharynx as per pre-operative assessment  Comments: With ease.

## 2023-10-13 NOTE — Anesthesia Preprocedure Evaluation (Signed)
Anesthesia Evaluation  Patient identified by MRN, date of birth, ID band Patient awake    Reviewed: Allergy & Precautions, H&P , NPO status , Patient's Chart, lab work & pertinent test results  History of Anesthesia Complications (+) PONV and history of anesthetic complications  Airway Mallampati: II  TM Distance: >3 FB Neck ROM: Full    Dental no notable dental hx.    Pulmonary asthma , sleep apnea , COPD, former smoker   Pulmonary exam normal breath sounds clear to auscultation       Cardiovascular + CAD  Normal cardiovascular exam Rhythm:Regular Rate:Normal     Neuro/Psych  Headaches PSYCHIATRIC DISORDERS Anxiety Depression     Neuromuscular disease    GI/Hepatic Neg liver ROS,GERD  ,,  Endo/Other  Hypothyroidism    Renal/GU negative Renal ROS  negative genitourinary   Musculoskeletal  (+) Arthritis ,  Fibromyalgia -  Abdominal  (+) + obese  Peds negative pediatric ROS (+)  Hematology negative hematology ROS (+)   Anesthesia Other Findings   Reproductive/Obstetrics negative OB ROS                             Anesthesia Physical Anesthesia Plan  ASA: 2  Anesthesia Plan: General   Post-op Pain Management:    Induction: Intravenous  PONV Risk Score and Plan: 3 and Scopolamine patch - Pre-op and Ondansetron  Airway Management Planned:   Additional Equipment:   Intra-op Plan:   Post-operative Plan: Extubation in OR  Informed Consent: I have reviewed the patients History and Physical, chart, labs and discussed the procedure including the risks, benefits and alternatives for the proposed anesthesia with the patient or authorized representative who has indicated his/her understanding and acceptance.     Dental advisory given  Plan Discussed with: CRNA  Anesthesia Plan Comments:        Anesthesia Quick Evaluation

## 2023-10-13 NOTE — Transfer of Care (Signed)
Immediate Anesthesia Transfer of Care Note  Patient: Laura Burnett  Procedure(s) Performed: XI ROBOTIC ASSISTED LAPAROSCOPIC CHOLECYSTECTOMY (Abdomen)  Patient Location: PACU  Anesthesia Type:General  Level of Consciousness: awake, alert , and patient cooperative  Airway & Oxygen Therapy: Patient Spontanous Breathing and Patient connected to nasal cannula oxygen  Post-op Assessment: Report given to RN, Post -op Vital signs reviewed and stable, and Patient moving all extremities X 4  Post vital signs: Reviewed and stable  Last Vitals:  Vitals Value Taken Time  BP 136/70 10/13/23 1329  Temp 36.1   Pulse 93 10/13/23 1330  Resp 13 10/13/23 1330  SpO2 99 % 10/13/23 1330  Vitals shown include unfiled device data.  Last Pain:  Vitals:   10/13/23 1142  TempSrc: Oral  PainSc: 4       Patients Stated Pain Goal: 5 (10/13/23 1142)  Complications: No notable events documented.

## 2023-10-13 NOTE — Interval H&P Note (Signed)
History and Physical Interval Note:  10/13/2023 11:23 AM  Laura Burnett  has presented today for surgery, with the diagnosis of CHRONIC CHOLECYSTITIS.  The various methods of treatment have been discussed with the patient and family. After consideration of risks, benefits and other options for treatment, the patient has consented to  Procedure(s): XI ROBOTIC ASSISTED LAPAROSCOPIC CHOLECYSTECTOMY (N/A) as a surgical intervention.  The patient's history has been reviewed, patient examined, no change in status, stable for surgery.  I have reviewed the patient's chart and labs.  Questions were answered to the patient's satisfaction.     Franky Macho

## 2023-10-13 NOTE — Op Note (Signed)
Patient:  Laura Burnett  DOB:  11/30/1965  MRN:  [id number]   Preop Diagnosis: Chronic cholecystitis  Postop Diagnosis: Same  Procedure: Robotic assisted laparoscopic cholecystectomy  Surgeon: Franky Macho, MD  Anes: General Endotracheal  Indications: Patient is a 58 year old white female who presents with chronic cholecystitis.  The risks and benefits of the procedure including bleeding, infection, hepatobiliary injury, and the possibility of an open procedure were fully explained to the patient, who gave informed consent.  Procedure note: The patient was placed in the supine position.  After induction of general endotracheal anesthesia, the abdomen was prepped and draped using the usual sterile technique with ChloraPrep.  Surgical site confirmation was performed.  An infraumbilical incision was made down to the fascia.  A Veress needle was introduced into the abdominal cavity and confirmation of placement was done using the saline drop test.  The abdomen was then insufflated to 15 mmHg pressure.  An 8 mm trocar was introduced into the abdominal cavity under direct visualization without difficulty.  The patient was placed in reverse Trendelenburg position and additional 8 mm trocars were placed in the left upper quadrant, right lower quadrant, and right flank regions.  The robot was then docked and targeted.  The liver appeared to be within normal limits.  The gallbladder was noted to be significantly elongated with adhesions of omentum present.  The gallbladder was retracted in a dynamic fashion in order to provide a critical view of the triangle of Calot.  The cystic duct was first identified.  Its junction to the infundibulum was fully identified.  This was confirmed by [facility].  Hem-o-lok clips were placed proximally and distally on the cystic duct and the cystic duct was divided.  This was likewise done the cystic artery.  The gallbladder was then freed away from the gallbladder fossa using  Bovie electrocautery.  The gallbladder was delivered through the left upper quadrant trocar site using an Endo Catch bag.  The gallbladder fossa was inspected and no bleeding or bile leakage was noted.  Surgicel was placed in the gallbladder fossa.  All fluid and air were then evacuated from the abdominal cavity prior to removal of the trocars.  All wounds were irrigated with normal saline.  All wounds were injected with 0.5% Sensorcaine.  All incisions were closed using a 4-0 Monocryl subcuticular suture.  Dermabond was applied.  All tape and needle counts were correct at the end of the procedure.  The patient was extubated in the operating room and transferred to PACU in stable condition.  Complications: None  EBL: Minimal  Specimen: Gallbladder

## 2023-10-14 LAB — SURGICAL PATHOLOGY

## 2023-10-15 ENCOUNTER — Telehealth (INDEPENDENT_AMBULATORY_CARE_PROVIDER_SITE_OTHER): Payer: Medicare HMO | Admitting: General Surgery

## 2023-10-15 ENCOUNTER — Telehealth: Payer: Self-pay | Admitting: *Deleted

## 2023-10-15 DIAGNOSIS — Z09 Encounter for follow-up examination after completed treatment for conditions other than malignant neoplasm: Secondary | ICD-10-CM

## 2023-10-15 NOTE — Telephone Encounter (Signed)
Surgical Date: 10/13/2023 Procedure: XI Assisted Laparoscopic Cholecystectomy  Received call from patient [phone]~ telephone.   Patient reports that umbilical incision appears inflamed. States that she has redness to spreading surrounding tissues approximately 1" below incision. Reports that surrounding tissues are hard, warm to touch and tender as well.   Patient denied drainage from incision or any opening of incision. Patient denied fever, chills, nausea, vomiting.   Patient also reports possible reaction to Tramadol. States that after taking Tramadol as prescribed for pain her face and cheeks were noted very red and hot to touch. Reports that Sx have resolved as she is not taking Tramadol any longer. Inquired if there is another medication for pain management she can try. States that she is currently using APAP only. Reports that she was advised by GI to discontinue IBU use after EGD.   Allergies noted to Sulfa ABTx and codeine.   Preferred pharmacy is [facility]

## 2023-10-15 NOTE — Telephone Encounter (Signed)
Called patient at home.  I was in the office.  She states she has some redness around her umbilical trocar site.  I told her this was not uncommon and this is probably early bruising.  She denies any fever or chills.  She otherwise is recovering from general anesthesia.  She denies any nausea or vomiting.  I told her to return should she have any other concerns.  Final pathology did reveal slight chronic cholecystitis.  As this was a part of the total global surgical fee, this was not a billable visit.  Total telephone time was approximately 3 minutes.

## 2023-10-18 NOTE — Telephone Encounter (Signed)
Patient contacted by provider and documented in new encounter.

## 2023-10-21 ENCOUNTER — Other Ambulatory Visit: Payer: Self-pay | Admitting: Nurse Practitioner

## 2023-10-21 DIAGNOSIS — F419 Anxiety disorder, unspecified: Secondary | ICD-10-CM

## 2023-10-21 DIAGNOSIS — I2510 Atherosclerotic heart disease of native coronary artery without angina pectoris: Secondary | ICD-10-CM

## 2023-10-21 DIAGNOSIS — R202 Paresthesia of skin: Secondary | ICD-10-CM

## 2023-10-21 DIAGNOSIS — R197 Diarrhea, unspecified: Secondary | ICD-10-CM

## 2023-10-21 DIAGNOSIS — E782 Mixed hyperlipidemia: Secondary | ICD-10-CM

## 2023-10-21 DIAGNOSIS — E039 Hypothyroidism, unspecified: Secondary | ICD-10-CM

## 2023-10-21 DIAGNOSIS — G8929 Other chronic pain: Secondary | ICD-10-CM

## 2023-10-21 DIAGNOSIS — Z0000 Encounter for general adult medical examination without abnormal findings: Secondary | ICD-10-CM

## 2023-10-22 ENCOUNTER — Telehealth: Payer: Self-pay | Admitting: *Deleted

## 2023-10-22 NOTE — Telephone Encounter (Signed)
Surgical Date: 10/13/2023 Procedure: XI Assisted Laparoscopic Cholecystectomy   Received call from patient [phone]~ telephone.    Patient reports that umbilical incision appears inflamed. States that she has redness to spreading surrounding tissues approximately 1" below incision. Reports that she also has irritation at tape sites from sterile drapes.    Patient denied drainage from incision or any opening of incision. Patient denied fever, chills, nausea, vomiting.   Discussed with provider on- call, Dr. Robyne Peers. Advised to use hydrocortisone cream to affected areas as patient has allergy noted to prednisone.   Call placed to patient and patient made aware. Advised to contact office is symptoms worsen or do not improve.

## 2023-11-09 DIAGNOSIS — M25529 Pain in unspecified elbow: Secondary | ICD-10-CM | POA: Diagnosis not present

## 2023-11-09 DIAGNOSIS — R5383 Other fatigue: Secondary | ICD-10-CM | POA: Diagnosis not present

## 2023-11-09 DIAGNOSIS — E039 Hypothyroidism, unspecified: Secondary | ICD-10-CM | POA: Diagnosis not present

## 2023-11-09 DIAGNOSIS — K2970 Gastritis, unspecified, without bleeding: Secondary | ICD-10-CM | POA: Diagnosis not present

## 2023-11-09 DIAGNOSIS — Z299 Encounter for prophylactic measures, unspecified: Secondary | ICD-10-CM | POA: Diagnosis not present

## 2023-11-09 DIAGNOSIS — E049 Nontoxic goiter, unspecified: Secondary | ICD-10-CM | POA: Diagnosis not present
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
## 2023-11-26 ENCOUNTER — Telehealth (INDEPENDENT_AMBULATORY_CARE_PROVIDER_SITE_OTHER): Payer: Medicare HMO | Admitting: Psychiatry

## 2023-11-26 ENCOUNTER — Encounter: Payer: Self-pay | Admitting: Psychiatry

## 2023-11-26 DIAGNOSIS — F411 Generalized anxiety disorder: Secondary | ICD-10-CM | POA: Diagnosis not present

## 2023-11-26 DIAGNOSIS — F3341 Major depressive disorder, recurrent, in partial remission: Secondary | ICD-10-CM

## 2023-11-30 DIAGNOSIS — R52 Pain, unspecified: Secondary | ICD-10-CM | POA: Diagnosis not present

## 2023-11-30 DIAGNOSIS — Z299 Encounter for prophylactic measures, unspecified: Secondary | ICD-10-CM | POA: Diagnosis not present

## 2023-11-30 DIAGNOSIS — J029 Acute pharyngitis, unspecified: Secondary | ICD-10-CM | POA: Diagnosis not present

## 2023-11-30 DIAGNOSIS — M797 Fibromyalgia: Secondary | ICD-10-CM | POA: Diagnosis not present

## 2023-11-30 DIAGNOSIS — H9209 Otalgia, unspecified ear: Secondary | ICD-10-CM | POA: Diagnosis not present

## 2023-12-01 ENCOUNTER — Other Ambulatory Visit: Payer: Self-pay | Admitting: Psychiatry

## 2023-12-01 DIAGNOSIS — F411 Generalized anxiety disorder: Secondary | ICD-10-CM

## 2023-12-02 ENCOUNTER — Telehealth: Payer: Self-pay

## 2023-12-02 NOTE — Telephone Encounter (Signed)
 pharamcy was faxed and confirmed the approval notice of the bupropion

## 2023-12-02 NOTE — Telephone Encounter (Signed)
 went online to [email or web address] and submitted the prior auth- approved from 10-30-23 to 12-01-24

## 2023-12-02 NOTE — Telephone Encounter (Signed)
 received fax that medication bupropion would need a prior auth

## 2023-12-24 ENCOUNTER — Other Ambulatory Visit: Payer: Self-pay | Admitting: Nurse Practitioner

## 2023-12-24 DIAGNOSIS — M542 Cervicalgia: Secondary | ICD-10-CM

## 2023-12-24 DIAGNOSIS — G5601 Carpal tunnel syndrome, right upper limb: Secondary | ICD-10-CM

## 2023-12-24 DIAGNOSIS — M5416 Radiculopathy, lumbar region: Secondary | ICD-10-CM

## 2023-12-24 DIAGNOSIS — M65321 Trigger finger, right index finger: Secondary | ICD-10-CM

## 2023-12-24 NOTE — Telephone Encounter (Signed)
 Last apt 06/21/23 pt. Has a new PCP listed on her chart not sure if she is still our pt

## 2023-12-29 DIAGNOSIS — E669 Obesity, unspecified: Secondary | ICD-10-CM | POA: Diagnosis not present

## 2023-12-29 DIAGNOSIS — E785 Hyperlipidemia, unspecified: Secondary | ICD-10-CM | POA: Diagnosis not present

## 2023-12-29 DIAGNOSIS — J4540 Moderate persistent asthma, uncomplicated: Secondary | ICD-10-CM | POA: Diagnosis not present

## 2023-12-29 DIAGNOSIS — F3341 Major depressive disorder, recurrent, in partial remission: Secondary | ICD-10-CM | POA: Diagnosis not present

## 2023-12-29 DIAGNOSIS — F17211 Nicotine dependence, cigarettes, in remission: Secondary | ICD-10-CM | POA: Diagnosis not present

## 2023-12-29 DIAGNOSIS — I2510 Atherosclerotic heart disease of native coronary artery without angina pectoris: Secondary | ICD-10-CM | POA: Diagnosis not present

## 2023-12-29 DIAGNOSIS — Z6832 Body mass index (BMI) 32.0-32.9, adult: Secondary | ICD-10-CM | POA: Diagnosis not present

## 2023-12-29 DIAGNOSIS — Z008 Encounter for other general examination: Secondary | ICD-10-CM | POA: Diagnosis not present

## 2023-12-29 DIAGNOSIS — M459 Ankylosing spondylitis of unspecified sites in spine: Secondary | ICD-10-CM | POA: Diagnosis not present

## 2023-12-30 DIAGNOSIS — R52 Pain, unspecified: Secondary | ICD-10-CM | POA: Diagnosis not present

## 2023-12-30 DIAGNOSIS — Z299 Encounter for prophylactic measures, unspecified: Secondary | ICD-10-CM | POA: Diagnosis not present

## 2023-12-30 DIAGNOSIS — Z0000 Encounter for general adult medical examination without abnormal findings: Secondary | ICD-10-CM | POA: Diagnosis not present

## 2023-12-30 DIAGNOSIS — Z1339 Encounter for screening examination for other mental health and behavioral disorders: Secondary | ICD-10-CM | POA: Diagnosis not present

## 2023-12-30 DIAGNOSIS — Z6832 Body mass index (BMI) 32.0-32.9, adult: Secondary | ICD-10-CM | POA: Diagnosis not present

## 2023-12-30 DIAGNOSIS — Z1331 Encounter for screening for depression: Secondary | ICD-10-CM | POA: Diagnosis not present

## 2023-12-30 DIAGNOSIS — Z7189 Other specified counseling: Secondary | ICD-10-CM | POA: Diagnosis not present

## 2023-12-30 DIAGNOSIS — E8940 Asymptomatic postprocedural ovarian failure: Secondary | ICD-10-CM | POA: Diagnosis not present

## 2023-12-30 DIAGNOSIS — K5900 Constipation, unspecified: Secondary | ICD-10-CM | POA: Diagnosis not present

## 2024-01-07 DIAGNOSIS — E039 Hypothyroidism, unspecified: Secondary | ICD-10-CM | POA: Diagnosis not present

## 2024-01-10 ENCOUNTER — Other Ambulatory Visit: Payer: Self-pay | Admitting: Nurse Practitioner

## 2024-01-12 DIAGNOSIS — M9903 Segmental and somatic dysfunction of lumbar region: Secondary | ICD-10-CM | POA: Diagnosis not present

## 2024-01-12 DIAGNOSIS — S233XXA Sprain of ligaments of thoracic spine, initial encounter: Secondary | ICD-10-CM | POA: Diagnosis not present

## 2024-01-12 DIAGNOSIS — M47816 Spondylosis without myelopathy or radiculopathy, lumbar region: Secondary | ICD-10-CM | POA: Diagnosis not present

## 2024-01-12 DIAGNOSIS — M9901 Segmental and somatic dysfunction of cervical region: Secondary | ICD-10-CM | POA: Diagnosis not present

## 2024-01-12 DIAGNOSIS — M47812 Spondylosis without myelopathy or radiculopathy, cervical region: Secondary | ICD-10-CM | POA: Diagnosis not present

## 2024-01-12 DIAGNOSIS — M9902 Segmental and somatic dysfunction of thoracic region: Secondary | ICD-10-CM | POA: Diagnosis not present

## 2024-01-19 ENCOUNTER — Telehealth: Admitting: Psychiatry

## 2024-01-20 DIAGNOSIS — H6990 Unspecified Eustachian tube disorder, unspecified ear: Secondary | ICD-10-CM | POA: Diagnosis not present

## 2024-01-20 DIAGNOSIS — R52 Pain, unspecified: Secondary | ICD-10-CM | POA: Diagnosis not present

## 2024-01-20 DIAGNOSIS — R42 Dizziness and giddiness: Secondary | ICD-10-CM | POA: Diagnosis not present

## 2024-01-20 DIAGNOSIS — H9209 Otalgia, unspecified ear: Secondary | ICD-10-CM | POA: Diagnosis not present

## 2024-01-20 DIAGNOSIS — Z299 Encounter for prophylactic measures, unspecified: Secondary | ICD-10-CM | POA: Diagnosis not present

## 2024-02-04 ENCOUNTER — Other Ambulatory Visit: Payer: Self-pay | Admitting: Nurse Practitioner

## 2024-02-04 DIAGNOSIS — R1013 Epigastric pain: Secondary | ICD-10-CM
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
## 2024-02-21 ENCOUNTER — Telehealth (INDEPENDENT_AMBULATORY_CARE_PROVIDER_SITE_OTHER): Admitting: Psychiatry

## 2024-02-21 ENCOUNTER — Encounter: Payer: Self-pay | Admitting: Psychiatry

## 2024-02-21 DIAGNOSIS — F411 Generalized anxiety disorder: Secondary | ICD-10-CM

## 2024-02-21 DIAGNOSIS — G4700 Insomnia, unspecified: Secondary | ICD-10-CM

## 2024-02-21 DIAGNOSIS — F330 Major depressive disorder, recurrent, mild: Secondary | ICD-10-CM | POA: Diagnosis not present

## 2024-02-21 MED ORDER — DULOXETINE HCL 60 MG PO CPEP: 120.0000 mg | ORAL_CAPSULE | Freq: Every day | ORAL | 1 refills | Status: DC

## 2024-02-21 MED ORDER — BUPROPION HCL ER (XL) 150 MG PO TB24: 450.0000 mg | ORAL_TABLET | Freq: Every day | ORAL | 1 refills | Status: DC

## 2024-02-21 NOTE — Patient Instructions (Signed)
 Continue duloxetine  120 mg at night  Continue bupropion  450 mg daily  Next appointment: 8/1 at 10 30

## 2024-02-25 ENCOUNTER — Other Ambulatory Visit: Payer: Self-pay | Admitting: Internal Medicine

## 2024-02-25 DIAGNOSIS — Z1231 Encounter for screening mammogram for malignant neoplasm of breast: Secondary | ICD-10-CM

## 2024-02-29 ENCOUNTER — Inpatient Hospital Stay: Admission: RE | Admit: 2024-02-29 | Source: Ambulatory Visit

## 2024-02-29 ENCOUNTER — Ambulatory Visit
Admission: RE | Admit: 2024-02-29 | Discharge: 2024-02-29 | Disposition: A | Discharge status: Home / Self Care | Source: Ambulatory Visit | Attending: Internal Medicine | Admitting: Internal Medicine

## 2024-02-29 DIAGNOSIS — Z1231 Encounter for screening mammogram for malignant neoplasm of breast: Secondary | ICD-10-CM

## 2024-03-01 ENCOUNTER — Ambulatory Visit: Admitting: Podiatry

## 2024-03-01 ENCOUNTER — Encounter: Payer: Self-pay | Admitting: Podiatry

## 2024-03-01 ENCOUNTER — Ambulatory Visit (INDEPENDENT_AMBULATORY_CARE_PROVIDER_SITE_OTHER)

## 2024-03-01 DIAGNOSIS — S9031XA Contusion of right foot, initial encounter: Secondary | ICD-10-CM | POA: Diagnosis not present

## 2024-03-01 DIAGNOSIS — M7751 Other enthesopathy of right foot: Secondary | ICD-10-CM | POA: Diagnosis not present

## 2024-03-01 MED ORDER — METHYLPREDNISOLONE 4 MG PO TBPK: ORAL_TABLET | ORAL | 0 refills | Status: AC

## 2024-03-01 NOTE — Progress Notes (Signed)
 Presents today with complaint of having dropped frozen ice cream ball on foot right.  Says been sore since 4 days ago.  Wearing a shoe.  Says the big toe joint feels a little sore.  Has not noticed any redness or ecchymosis.   Physical exam:  General appearance: Pleasant, and in no acute distress. AOx3.  Vascular: Pedal pulses: DP 2/4 bilaterally, PT 2/4 bilaterally. Mild edema lower legs bilaterally. Capillary fill time immediate.  Some localized edema over the dorsal aspect of the foot on the right  Neurological: Light touch intact feet bilaterally.  Normal Achilles reflex bilaterally.  Negative Tinel's sign dorsal cutaneous nerves right foot.  Dermatologic:   Skin normal temperature bilaterally.  Skin normal color, tone, and texture bilaterally.  Soft tissue swelling dorsal aspect of the foot mostly on the medial dorsal aspect  Musculoskeletal: Tenderness over the first metatarsal shaft and at the first MTP as well as the first TMT right.  Slight tenderness with range of motion of the first MTP right.  No tenderness with range of motion of the TMT.  No crepitation noted.  Radiographs: 3 views right foot: Hardware in place at distal  osteotomy site..  No significant narrowing at TMT 1 or MTP 1.. no evidence of fracture right.    Diagnosis: 1.  Contusion dorsal aspect right foot 2.  Capsulitis first metatarsal phalangeal right foot.  Plan: -Established office visit for evaluation and management level 3 - Recommended RICE - Rx Medrol  Dosepak take as directed. - Discussed with her there is no disruption of the internal fixation or evidence of any fractures.  Is probably just soft tissue bruising.  Potential that she could have a bone bruise also.   Return 2 weeks follow-up contusion right foot

## 2024-03-01 NOTE — Addendum Note (Signed)
 Addended by: WAYLAN ELIDIA PARAS on: 03/01/2024 09:09 AM   Modules accepted: Orders

## 2024-03-15 ENCOUNTER — Ambulatory Visit: Admitting: Podiatry
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
## 2024-03-31 ENCOUNTER — Telehealth: Admitting: Psychiatry

## 2024-03-31 ENCOUNTER — Encounter: Payer: Self-pay | Admitting: Psychiatry

## 2024-03-31 DIAGNOSIS — F411 Generalized anxiety disorder: Secondary | ICD-10-CM | POA: Diagnosis not present

## 2024-03-31 DIAGNOSIS — F330 Major depressive disorder, recurrent, mild: Secondary | ICD-10-CM | POA: Diagnosis not present

## 2024-03-31 NOTE — Patient Instructions (Signed)
 Continue duloxetine  120 mg at night  Continue bupropion  450 mg daily  Please ask your provider to sent the lab result to our clinic Next appointment: 9/19 at 11 am

## 2024-04-04 DIAGNOSIS — Z0000 Encounter for general adult medical examination without abnormal findings: Secondary | ICD-10-CM | POA: Diagnosis not present

## 2024-04-04 DIAGNOSIS — M797 Fibromyalgia: Secondary | ICD-10-CM | POA: Diagnosis not present

## 2024-04-04 DIAGNOSIS — Z299 Encounter for prophylactic measures, unspecified: Secondary | ICD-10-CM | POA: Diagnosis not present

## 2024-04-04 DIAGNOSIS — R52 Pain, unspecified: Secondary | ICD-10-CM | POA: Diagnosis not present

## 2024-04-06 ENCOUNTER — Ambulatory Visit: Payer: 59 | Admitting: Nurse Practitioner

## 2024-04-20 DIAGNOSIS — G4700 Insomnia, unspecified: Secondary | ICD-10-CM | POA: Diagnosis not present

## 2024-04-20 DIAGNOSIS — Z299 Encounter for prophylactic measures, unspecified: Secondary | ICD-10-CM | POA: Diagnosis not present

## 2024-04-20 DIAGNOSIS — R21 Rash and other nonspecific skin eruption: Secondary | ICD-10-CM | POA: Diagnosis not present

## 2024-04-21 ENCOUNTER — Encounter: Payer: Self-pay | Admitting: Radiology
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
## 2024-05-19 ENCOUNTER — Encounter: Payer: Self-pay | Admitting: Psychiatry

## 2024-05-19 ENCOUNTER — Telehealth (INDEPENDENT_AMBULATORY_CARE_PROVIDER_SITE_OTHER): Admitting: Psychiatry

## 2024-05-19 DIAGNOSIS — F411 Generalized anxiety disorder: Secondary | ICD-10-CM

## 2024-05-19 DIAGNOSIS — F330 Major depressive disorder, recurrent, mild: Secondary | ICD-10-CM | POA: Diagnosis not present

## 2024-05-19 MED ORDER — L-METHYLFOLATE 7.5 MG PO TABS: 7.5000 mg | ORAL_TABLET | Freq: Every day | ORAL | 1 refills | Status: AC

## 2024-05-19 NOTE — Patient Instructions (Addendum)
 Continue duloxetine  120 mg at night  Decrease bupropion  300 mg daily  Start L-methylfolate 7.5 mg daily Next appointment: 11/14 at 11 am

## 2024-05-24 DIAGNOSIS — Z23 Encounter for immunization: Secondary | ICD-10-CM | POA: Diagnosis not present

## 2024-05-24 DIAGNOSIS — N390 Urinary tract infection, site not specified: Secondary | ICD-10-CM | POA: Diagnosis not present

## 2024-05-24 DIAGNOSIS — Z299 Encounter for prophylactic measures, unspecified: Secondary | ICD-10-CM | POA: Diagnosis not present

## 2024-05-24 DIAGNOSIS — G4700 Insomnia, unspecified: Secondary | ICD-10-CM | POA: Diagnosis not present

## 2024-05-24 DIAGNOSIS — R300 Dysuria: Secondary | ICD-10-CM | POA: Diagnosis not present

## 2024-06-01 ENCOUNTER — Other Ambulatory Visit: Payer: Self-pay | Admitting: Psychiatry

## 2024-06-01 DIAGNOSIS — F411 Generalized anxiety disorder: Secondary | ICD-10-CM

## 2024-06-07 DIAGNOSIS — N390 Urinary tract infection, site not specified: Secondary | ICD-10-CM | POA: Diagnosis not present

## 2024-06-07 DIAGNOSIS — Z79899 Other long term (current) drug therapy: Secondary | ICD-10-CM | POA: Diagnosis not present

## 2024-06-07 DIAGNOSIS — Z299 Encounter for prophylactic measures, unspecified: Secondary | ICD-10-CM | POA: Diagnosis not present

## 2024-06-07 DIAGNOSIS — R413 Other amnesia: Secondary | ICD-10-CM | POA: Diagnosis not present

## 2024-06-07 DIAGNOSIS — M797 Fibromyalgia: Secondary | ICD-10-CM | POA: Diagnosis not present

## 2024-06-07 DIAGNOSIS — E039 Hypothyroidism, unspecified: Secondary | ICD-10-CM | POA: Diagnosis not present

## 2024-06-14 DIAGNOSIS — R413 Other amnesia: Secondary | ICD-10-CM | POA: Diagnosis not present

## 2024-07-03 ENCOUNTER — Encounter: Payer: Self-pay | Admitting: Radiology
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
## 2024-07-14 ENCOUNTER — Encounter: Payer: Self-pay | Admitting: Psychiatry

## 2024-07-14 ENCOUNTER — Telehealth: Admitting: Psychiatry

## 2024-07-14 DIAGNOSIS — F411 Generalized anxiety disorder: Secondary | ICD-10-CM

## 2024-07-14 DIAGNOSIS — F330 Major depressive disorder, recurrent, mild: Secondary | ICD-10-CM | POA: Diagnosis not present

## 2024-07-14 MED ORDER — DULOXETINE HCL 60 MG PO CPEP: 120.0000 mg | ORAL_CAPSULE | Freq: Every day | ORAL | 1 refills | Status: AC
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
## 2024-09-15 ENCOUNTER — Telehealth: Payer: Medicare (Managed Care) | Admitting: Psychiatry

## 2024-09-15 ENCOUNTER — Encounter: Payer: Self-pay | Admitting: Psychiatry

## 2024-09-15 DIAGNOSIS — G2581 Restless legs syndrome: Secondary | ICD-10-CM | POA: Diagnosis not present

## 2024-09-15 DIAGNOSIS — G4700 Insomnia, unspecified: Secondary | ICD-10-CM

## 2024-09-15 DIAGNOSIS — F330 Major depressive disorder, recurrent, mild: Secondary | ICD-10-CM

## 2024-09-15 DIAGNOSIS — F411 Generalized anxiety disorder: Secondary | ICD-10-CM

## 2024-09-15 MED ORDER — ARIPIPRAZOLE 2 MG PO TABS: 2.0000 mg | ORAL_TABLET | Freq: Every day | ORAL | 1 refills | Status: AC

## 2024-10-20 ENCOUNTER — Telehealth: Payer: Medicare (Managed Care) | Admitting: Psychiatry
